# Patient Record
Sex: Male | Born: 1940 | Race: White | Hispanic: No | Marital: Married | Smoking: Former smoker
Health system: Southern US, Community
[De-identification: ages and names within clinical notes are randomized; demographics above are authoritative.]

## PROBLEM LIST (undated history)

## (undated) DIAGNOSIS — K5790 Diverticulosis of intestine, part unspecified, without perforation or abscess without bleeding: Secondary | ICD-10-CM

## (undated) DIAGNOSIS — K602 Anal fissure, unspecified: Secondary | ICD-10-CM

## (undated) DIAGNOSIS — K625 Hemorrhage of anus and rectum: Secondary | ICD-10-CM

## (undated) DIAGNOSIS — C61 Malignant neoplasm of prostate: Secondary | ICD-10-CM

## (undated) DIAGNOSIS — R001 Bradycardia, unspecified: Secondary | ICD-10-CM

## (undated) DIAGNOSIS — N289 Disorder of kidney and ureter, unspecified: Secondary | ICD-10-CM

## (undated) DIAGNOSIS — I10 Essential (primary) hypertension: Secondary | ICD-10-CM

## (undated) DIAGNOSIS — Z8546 Personal history of malignant neoplasm of prostate: Secondary | ICD-10-CM

## (undated) DIAGNOSIS — I739 Peripheral vascular disease, unspecified: Secondary | ICD-10-CM

## (undated) DIAGNOSIS — I480 Paroxysmal atrial fibrillation: Secondary | ICD-10-CM

## (undated) DIAGNOSIS — K627 Radiation proctitis: Secondary | ICD-10-CM

## (undated) DIAGNOSIS — S12100A Unspecified displaced fracture of second cervical vertebra, initial encounter for closed fracture: Secondary | ICD-10-CM

## (undated) DIAGNOSIS — N189 Chronic kidney disease, unspecified: Secondary | ICD-10-CM

## (undated) DIAGNOSIS — E785 Hyperlipidemia, unspecified: Secondary | ICD-10-CM

## (undated) DIAGNOSIS — K219 Gastro-esophageal reflux disease without esophagitis: Secondary | ICD-10-CM

## (undated) HISTORY — DX: Essential (primary) hypertension: I10

## (undated) HISTORY — DX: Personal history of malignant neoplasm of prostate: Z85.46

## (undated) HISTORY — DX: Gastro-esophageal reflux disease without esophagitis: K21.9

## (undated) HISTORY — DX: Diverticulosis of intestine, part unspecified, without perforation or abscess without bleeding: K57.90

## (undated) HISTORY — DX: Disorder of kidney and ureter, unspecified: N28.9

## (undated) HISTORY — DX: Chronic kidney disease, unspecified: N18.9

## (undated) HISTORY — DX: Anal fissure, unspecified: K60.2

## (undated) HISTORY — DX: Bradycardia, unspecified: R00.1

## (undated) HISTORY — DX: Paroxysmal atrial fibrillation: I48.0

## (undated) HISTORY — DX: Radiation proctitis: K62.7

## (undated) HISTORY — PX: CAROTID STENT: SHX1301

## (undated) HISTORY — DX: Hyperlipidemia, unspecified: E78.5

## (undated) HISTORY — DX: Hemorrhage of anus and rectum: K62.5

## (undated) HISTORY — DX: Malignant neoplasm of prostate: C61

---

## 2003-11-26 ENCOUNTER — Emergency Department (HOSPITAL_COMMUNITY): Admission: EM | Admit: 2003-11-26 | Discharge: 2003-11-26 | Payer: Self-pay | Admitting: Emergency Medicine

## 2004-09-09 ENCOUNTER — Ambulatory Visit: Payer: Self-pay | Admitting: Internal Medicine

## 2004-10-16 ENCOUNTER — Ambulatory Visit: Payer: Self-pay | Admitting: Internal Medicine

## 2007-02-25 HISTORY — PX: OTHER SURGICAL HISTORY: SHX169

## 2010-08-19 ENCOUNTER — Other Ambulatory Visit (HOSPITAL_COMMUNITY): Payer: Self-pay | Admitting: Orthopedic Surgery

## 2010-08-19 DIAGNOSIS — IMO0002 Reserved for concepts with insufficient information to code with codable children: Secondary | ICD-10-CM

## 2010-09-10 ENCOUNTER — Encounter (HOSPITAL_COMMUNITY)
Admission: RE | Admit: 2010-09-10 | Discharge: 2010-09-10 | Disposition: A | Discharge status: Home / Self Care | Payer: Medicare Other | Source: Ambulatory Visit | Attending: Orthopedic Surgery | Admitting: Orthopedic Surgery

## 2010-09-10 ENCOUNTER — Encounter (HOSPITAL_COMMUNITY): Payer: Self-pay

## 2010-09-10 DIAGNOSIS — IMO0002 Reserved for concepts with insufficient information to code with codable children: Secondary | ICD-10-CM

## 2010-09-10 DIAGNOSIS — L608 Other nail disorders: Secondary | ICD-10-CM | POA: Insufficient documentation

## 2010-09-10 DIAGNOSIS — L723 Sebaceous cyst: Secondary | ICD-10-CM | POA: Insufficient documentation

## 2010-09-10 MED ORDER — TECHNETIUM TC 99M MEDRONATE IV KIT
23.5000 | PACK | Freq: Once | INTRAVENOUS | Status: AC | PRN
  Administered 2010-09-10: 23.5 via INTRAVENOUS

## 2011-04-28 DIAGNOSIS — D649 Anemia, unspecified: Secondary | ICD-10-CM | POA: Diagnosis not present

## 2011-04-28 DIAGNOSIS — I129 Hypertensive chronic kidney disease with stage 1 through stage 4 chronic kidney disease, or unspecified chronic kidney disease: Secondary | ICD-10-CM | POA: Diagnosis not present

## 2011-04-28 DIAGNOSIS — E039 Hypothyroidism, unspecified: Secondary | ICD-10-CM | POA: Diagnosis not present

## 2011-04-28 DIAGNOSIS — N2581 Secondary hyperparathyroidism of renal origin: Secondary | ICD-10-CM | POA: Diagnosis not present

## 2011-04-28 DIAGNOSIS — E213 Hyperparathyroidism, unspecified: Secondary | ICD-10-CM | POA: Diagnosis not present

## 2011-05-07 ENCOUNTER — Encounter: Payer: Self-pay | Admitting: Internal Medicine

## 2011-05-07 ENCOUNTER — Telehealth: Payer: Self-pay | Admitting: Internal Medicine

## 2011-05-08 ENCOUNTER — Encounter: Payer: Self-pay | Admitting: Nurse Practitioner

## 2011-05-08 ENCOUNTER — Ambulatory Visit: Payer: BLUE CROSS/BLUE SHIELD | Admitting: Nurse Practitioner

## 2011-05-08 ENCOUNTER — Ambulatory Visit (INDEPENDENT_AMBULATORY_CARE_PROVIDER_SITE_OTHER): Payer: Medicare Other | Admitting: Nurse Practitioner

## 2011-05-08 VITALS — BP 128/56 | HR 48 | Ht 70.0 in | Wt 182.2 lb

## 2011-05-08 DIAGNOSIS — K625 Hemorrhage of anus and rectum: Secondary | ICD-10-CM | POA: Diagnosis not present

## 2011-05-08 NOTE — Telephone Encounter (Signed)
Pt states he has been having rectal bleeding with bright red blood. Sometimes there is blood on the tissue and sometimes it colors the toilet bowl. Pt requesting to be seen sooner that 1st available with Dr. Henrene Pastor. Pt scheduled to see Tye Savoy NP today at 11am. Pt aware of appt date and time.

## 2011-05-08 NOTE — Progress Notes (Signed)
05/08/2011 Timothy [name] [id number] [birth year]   HISTORY OF PRESENT ILLNESS: Patient is a 71 year old male who had a screening colonoscopy in 2006 by Dr. Henrene Pastor with findings of only diverticulosis. Patient presents with intermittent painless rectal bleeding. A couple of months ago he had a diarrheal illness with some associated rectal bleeding. Following that, while on a trip in [place], the patient had another episode of painless rectal bleeding not associated with any diarrhea nor constipation. Since then he has had a few more episodes of painless rectal bleeding in the setting of what he would consider normal bowel movements.  Past Medical History  Diagnosis Date  . Prostate cancer   . Kidney disease   . GERD (gastroesophageal reflux disease)   . HLD (hyperlipidemia)   . HTN (hypertension)   . Anal fissure     ?   Past Surgical History  Procedure Date  . Carotid stent     reports that he has quit smoking. His smoking use included Cigarettes. He has never used smokeless tobacco. He reports that he drinks alcohol. He reports that he does not use illicit drugs. family history is not on file. No Known Allergies    Outpatient Encounter Prescriptions as of 05/08/2011  Medication Sig Dispense Refill  . allopurinol (ZYLOPRIM) 300 MG tablet Take 150 mg by mouth daily.      Marland Kitchen amLODipine (NORVASC) 10 MG tablet Take 10 mg by mouth daily.      Marland Kitchen aspirin 81 MG tablet Take 81 mg by mouth daily.      Marland Kitchen atenolol (TENORMIN) 100 MG tablet Take 100 mg by mouth daily.      . Cholecalciferol (VITAMIN D) 2000 UNITS CAPS Take 1 capsule by mouth daily.      . furosemide (LASIX) 40 MG tablet Take 40 mg by mouth daily.      . Multiple Vitamin (MULTIVITAMIN) capsule Take 1 capsule by mouth once a week.      . rosuvastatin (CRESTOR) 20 MG tablet Take 20 mg by mouth daily.      Marland Kitchen telmisartan (MICARDIS) 80 MG tablet Take 80 mg by mouth daily.       REVIEW OF SYSTEMS  : All other systems reviewed and  negative except where noted in the History of Present Illness.   PHYSICAL EXAM: BP 128/56  Pulse 48  Ht 5\' 10"  (1.778 m)  Wt 182 lb 4 oz (82.668 kg)  BMI 26.15 kg/m2 General: Well developed white male in no acute distress Head: Normocephalic and atraumatic Eyes:  sclerae anicteric,conjunctive pink. Ears: Normal auditory acuity Neck: Supple, no masses.  Lungs: Clear throughout to auscultation Heart: Regular rate and rhythm Abdomen: Soft, non distended, nontender. No masses or hepatomegaly noted. Normal Bowel sounds Rectal: on anoscopy there was an internal hemorrhoid but beyond that were small red lesions resembling telangiectasias.  Musculoskeletal: Symmetrical with no gross deformities  Skin: No lesions on visible extremities Extremities: No edema or deformities noted Neurological: Alert oriented, grossly nonfocal Psychological:  Alert and cooperative. Normal mood and affect  ASSESSMENT AND PLAN;  1. 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  Patient scheduled for flexible sigmoidoscopy with APC. The risks and benefits were discussed with the patient and he consents to proceed.   2. Bradycardia, heart rate 48. Patient on a beta blocker, asymptomatic.

## 2011-05-08 NOTE — Patient Instructions (Signed)
We have given you instructions for the Flexible Sigmoidoscopy with Dr Lenon Curt at [facility], 1st floor.  Directions provided.  Flexible Sigmoidoscopy Your caregiver has ordered a flexible sigmoidoscopy. This is an exam to evaluate your lower colon. In this exam your colon is cleansed and a short fiber optic tube is inserted through your rectum and into your colon. The fiber optic scope (endoscope) is a short bundle of enclosed flexible small glass fibers. It transmits light to the area examined and images from that area to your caregiver. You do not have to worry about glass breakage in the endoscope. Discomfort is usually minimal. Sedatives and pain medications are generally not required. This exam helps to detect tumors (lumps), polyps, inflammation (swelling and soreness), and areas of bleeding. It may also be used to take biopsies. These are small pieces of tissue taken to examine under a microscope. LET YOUR CAREGIVER KNOW ABOUT:  Allergies.   Medications taken including herbs, eye drops, over the counter medications, and creams.   Use of steroids (by mouth or creams).   Previous problems with anesthetics or novocaine   Possibility of pregnancy, if this applies.   History of blood clots (thrombophlebitis).   History of bleeding or blood problems.   Previous surgery.   Other health problems.  BEFORE THE PROCEDURE Eat normally the night before the exam. Your caregiver may order a mild enema or laxative the night before. No eating or drinking should occur after midnight until the procedure is completed. A rectal suppository or enemas may be given in the morning prior to your procedure. You will be brought to the examination area in a hospital gown. You should be present 60 minutes prior to your procedure or as directed.  AFTER THE PROCEDURE   There is sometimes a little blood passed with the first bowel movement. Do not be concerned. Because air is often used  during the exam, it is not unusual to pass gas and experience abdominal (belly) cramping. Walking or a warm pack on your abdomen may help with this. Do not sleep with a heating pad as burns can occur.   You may resume all normal eating and activities.   Only take over-the-counter or prescription medicines for pain, discomfort, or fever as directed by your caregiver. Do not use aspirin or blood thinners if a biopsy (tissue sample) was taken. Consult your caregiver for medication usage if biopsies were taken.   Call for your results as instructed by your caregiver. Remember, it is your responsibility to obtain the results of your biopsy. Do not assume everything is fine because you do not hear from your caregiver.  SEEK IMMEDIATE MEDICAL CARE IF:  An oral temperature above 102 F (38.9 C) develops.   You pass large blood clots or fill a toilet with blood following the procedure. This may also occur 10 to 14 days following the procedure. It is more likely if a biopsy was taken.   You develop abdominal pain not relieved with medication or that is getting worse rather than better.  Document Released: 02/08/2000 Document Revised: 01/30/2011 Document Reviewed: 11/20/2004 [facility] Patient Information 2012 [place].

## 2011-05-09 ENCOUNTER — Encounter: Payer: Self-pay | Admitting: Nurse Practitioner

## 2011-05-09 DIAGNOSIS — K625 Hemorrhage of anus and rectum: Secondary | ICD-10-CM | POA: Insufficient documentation

## 2011-05-09 HISTORY — DX: Hemorrhage of anus and rectum: K62.5

## 2011-05-12 NOTE — Progress Notes (Signed)
Reviewed and agree with management. Neli Fofana D. Kyian Obst, M.D., FACG  

## 2011-05-21 ENCOUNTER — Ambulatory Visit: Payer: BLUE CROSS/BLUE SHIELD | Admitting: Internal Medicine

## 2011-05-22 ENCOUNTER — Encounter (HOSPITAL_COMMUNITY)
Admission: RE | Disposition: A | Discharge status: Home / Self Care | Payer: Self-pay | Source: Ambulatory Visit | Attending: Gastroenterology

## 2011-05-22 ENCOUNTER — Encounter (HOSPITAL_COMMUNITY): Payer: Self-pay | Admitting: *Deleted

## 2011-05-22 ENCOUNTER — Ambulatory Visit (HOSPITAL_COMMUNITY)
Admission: RE | Admit: 2011-05-22 | Discharge: 2011-05-22 | Disposition: A | Discharge status: Home / Self Care | Payer: Medicare Other | Source: Ambulatory Visit | Attending: Gastroenterology | Admitting: Gastroenterology

## 2011-05-22 DIAGNOSIS — Z79899 Other long term (current) drug therapy: Secondary | ICD-10-CM | POA: Insufficient documentation

## 2011-05-22 DIAGNOSIS — Y842 Radiological procedure and radiotherapy as the cause of abnormal reaction of the patient, or of later complication, without mention of misadventure at the time of the procedure: Secondary | ICD-10-CM | POA: Insufficient documentation

## 2011-05-22 DIAGNOSIS — Z8546 Personal history of malignant neoplasm of prostate: Secondary | ICD-10-CM | POA: Diagnosis not present

## 2011-05-22 DIAGNOSIS — I498 Other specified cardiac arrhythmias: Secondary | ICD-10-CM | POA: Diagnosis not present

## 2011-05-22 DIAGNOSIS — I10 Essential (primary) hypertension: Secondary | ICD-10-CM | POA: Diagnosis not present

## 2011-05-22 DIAGNOSIS — K625 Hemorrhage of anus and rectum: Secondary | ICD-10-CM

## 2011-05-22 DIAGNOSIS — K219 Gastro-esophageal reflux disease without esophagitis: Secondary | ICD-10-CM | POA: Insufficient documentation

## 2011-05-22 DIAGNOSIS — K627 Radiation proctitis: Secondary | ICD-10-CM

## 2011-05-22 DIAGNOSIS — E785 Hyperlipidemia, unspecified: Secondary | ICD-10-CM | POA: Insufficient documentation

## 2011-05-22 DIAGNOSIS — K5730 Diverticulosis of large intestine without perforation or abscess without bleeding: Secondary | ICD-10-CM | POA: Insufficient documentation

## 2011-05-22 DIAGNOSIS — Z7982 Long term (current) use of aspirin: Secondary | ICD-10-CM | POA: Insufficient documentation

## 2011-05-22 DIAGNOSIS — K6289 Other specified diseases of anus and rectum: Secondary | ICD-10-CM | POA: Insufficient documentation

## 2011-05-22 HISTORY — DX: Peripheral vascular disease, unspecified: I73.9

## 2011-05-22 HISTORY — DX: Hemorrhage of anus and rectum: K62.5

## 2011-05-22 HISTORY — DX: Radiation proctitis: K62.7

## 2011-05-22 HISTORY — PX: FLEXIBLE SIGMOIDOSCOPY: SHX5431

## 2011-05-22 HISTORY — PX: HOT HEMOSTASIS: SHX5433

## 2011-05-22 SURGERY — SIGMOIDOSCOPY, FLEXIBLE
Anesthesia: Moderate Sedation

## 2011-05-22 MED ORDER — SODIUM CHLORIDE 0.9 % IV SOLN
Freq: Once | INTRAVENOUS | Status: AC
  Administered 2011-05-22: 500 mL via INTRAVENOUS

## 2011-05-22 MED ORDER — MIDAZOLAM HCL 10 MG/2ML IJ SOLN
INTRAMUSCULAR | Status: DC | PRN
  Administered 2011-05-22 (×2): 2 mg via INTRAVENOUS
  Administered 2011-05-22: 1 mg via INTRAVENOUS

## 2011-05-22 MED ORDER — FENTANYL CITRATE 0.05 MG/ML IJ SOLN
INTRAMUSCULAR | Status: AC
  Filled 2011-05-22: qty 2

## 2011-05-22 MED ORDER — MIDAZOLAM HCL 10 MG/2ML IJ SOLN
INTRAMUSCULAR | Status: AC
  Filled 2011-05-22: qty 2

## 2011-05-22 MED ORDER — FENTANYL CITRATE 0.05 MG/ML IJ SOLN
INTRAMUSCULAR | Status: DC | PRN
  Administered 2011-05-22 (×2): 25 ug via INTRAVENOUS

## 2011-05-22 NOTE — H&P (View-Only) (Signed)
05/08/2011 Timothy [name] [id number] 02-Nov-1940   HISTORY OF PRESENT ILLNESS: Patient is a 71 year old male who had a screening colonoscopy in 2006 by Dr. Henrene Pastor with findings of only diverticulosis. Patient presents with intermittent painless rectal bleeding. A couple of months ago he had a diarrheal illness with some associated rectal bleeding. Following that, while on a trip in [place], the patient had another episode of painless rectal bleeding not associated with any diarrhea nor constipation. Since then he has had a few more episodes of painless rectal bleeding in the setting of what he would consider normal bowel movements.  Past Medical History  Diagnosis Date  . Prostate cancer   . Kidney disease   . GERD (gastroesophageal reflux disease)   . HLD (hyperlipidemia)   . HTN (hypertension)   . Anal fissure     ?   Past Surgical History  Procedure Date  . Carotid stent     reports that he has quit smoking. His smoking use included Cigarettes. He has never used smokeless tobacco. He reports that he drinks alcohol. He reports that he does not use illicit drugs. family history is not on file. No Known Allergies    Outpatient Encounter Prescriptions as of 05/08/2011  Medication Sig Dispense Refill  . allopurinol (ZYLOPRIM) 300 MG tablet Take 150 mg by mouth daily.      Marland Kitchen amLODipine (NORVASC) 10 MG tablet Take 10 mg by mouth daily.      Marland Kitchen aspirin 81 MG tablet Take 81 mg by mouth daily.      Marland Kitchen atenolol (TENORMIN) 100 MG tablet Take 100 mg by mouth daily.      . Cholecalciferol (VITAMIN D) 2000 UNITS CAPS Take 1 capsule by mouth daily.      . furosemide (LASIX) 40 MG tablet Take 40 mg by mouth daily.      . Multiple Vitamin (MULTIVITAMIN) capsule Take 1 capsule by mouth once a week.      . rosuvastatin (CRESTOR) 20 MG tablet Take 20 mg by mouth daily.      Marland Kitchen telmisartan (MICARDIS) 80 MG tablet Take 80 mg by mouth daily.       REVIEW OF SYSTEMS  : All other systems reviewed and  negative except where noted in the History of Present Illness.   PHYSICAL EXAM: BP 128/56  Pulse 48  Ht 5\' 10"  (1.778 m)  Wt 182 lb 4 oz (82.668 kg)  BMI 26.15 kg/m2 General: [name] developed white male in no acute distress Head: Normocephalic and atraumatic Eyes:  sclerae anicteric,conjunctive pink. Ears: Normal auditory acuity Neck: Supple, no masses.  Lungs: Clear throughout to auscultation Heart: Regular rate and rhythm Abdomen: Soft, non distended, nontender. No masses or hepatomegaly noted. Normal Bowel sounds Rectal: on anoscopy there was an internal hemorrhoid but beyond that were small red lesions resembling telangiectasias.  Musculoskeletal: Symmetrical with no gross deformities  Skin: No lesions on visible extremities Extremities: No edema or deformities noted Neurological: Alert oriented, grossly nonfocal Psychological:  Alert and cooperative. Normal mood and affect  ASSESSMENT AND PLAN;  1. 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  Patient scheduled for flexible sigmoidoscopy with APC. The risks and benefits were discussed with the patient and he consents to proceed.   2. Bradycardia, heart rate 48. Patient on a beta blocker, asymptomatic.

## 2011-05-22 NOTE — Op Note (Addendum)
[facility] [place], [place]  [address]  FLEXIBLE SIGMOIDOSCOPY PROCEDURE REPORT  PATIENT:  Timothy, [name]  MR#:  [id number] BIRTHDATE:  1940-08-12, 70 yrs. old  GENDER:  male  ENDOSCOPIST:  Sandy Salaam. Deatra Ina, MD Referred by:  Crist Infante, M.D.  PROCEDURE DATE:  05/22/2011 PROCEDURE:  Flexible Sigmoidoscopy with ablation of AVMs, Flexible Sigmoidoscopy for control of bleeding ASA CLASS:  Class II INDICATIONS:  rectal bleeding h/o radiation proctitis  MEDICATIONS:   These medications were titrated to patient response per physician's verbal order, Fentanyl 50 mcg IV, Versed 5 mg IV  DESCRIPTION OF PROCEDURE:   After the risks benefits and alternatives of the procedure were thoroughly explained, informed consent was obtained.  Digital rectal exam was performed and revealed no abnormalities.   The Pentax Egd Scope endoscope was introduced through the anus and advanced to the sigmoid colon, without limitations.  The quality of the prep was .  The instrument was then slowly withdrawn as the mucosa was fully examined. <<PROCEDUREIMAGES>>  radiation proctitis. Multiple areas of bleeding telangiectasia on anterior wall of rectum. Argon plasma coagulation was used to ablate the abnormal mucosa. The APC was ultilized to cauterize and fulgurate the numerous telangiectasias (see image1, image3, image5, and image6).  Mild diverticulosis was found in the sigmoid colon (see image2). Retroflexed views in the rectum revealed proctitis.    The scope was then withdrawn from the patient and the procedure terminated.  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 Deatra Ina, MD  CC:  n. REVISED:  05/22/2011 01:12 PM eSIGNED:   Sandy Salaam.  Steven Veazie at 05/22/2011 01:12 PM  [name], [id number]

## 2011-05-22 NOTE — Interval H&P Note (Signed)
History and Physical Interval Note:  05/22/2011 12:33 PM  Timothy [name]  has presented today for surgery, with the diagnosis of 569.3 Rectal bleeding  The various methods of treatment have been discussed with the patient and family. After consideration of risks, benefits and other options for treatment, the patient has consented to  Procedure(s) (LRB): FLEXIBLE SIGMOIDOSCOPY (N/A) HOT HEMOSTASIS (ARGON PLASMA COAGULATION/BICAP) (N/A) as a surgical intervention .  The patients' history has been reviewed, patient examined, no change in status, stable for surgery.  I have reviewed the patients' chart and labs.  Questions were answered to the patient's satisfaction.    The recent H&P (dated 05/08/11*) was reviewed, the patient was examined and there is no change in the patients condition since that H&P was completed.   Erskine Emery  05/22/2011, 12:34 PM    Erskine Emery

## 2011-05-22 NOTE — Discharge Instructions (Signed)
Colonoscopy Care After Read the instructions outlined below and refer to this sheet in the next few weeks. These discharge instructions provide you with general information on caring for yourself after you leave the hospital. Your doctor may also give you specific instructions. While your treatment has been planned according to the most current medical practices available, unavoidable complications occasionally occur. If you have any problems or questions after discharge, call your doctor. HOME CARE INSTRUCTIONS ACTIVITY:  You may resume your regular activity, but move at a slower pace for the next 24 hours.   Take frequent rest periods for the next 24 hours.   Walking will help get rid of the air and reduce the bloated feeling in your belly (abdomen).   No driving for 24 hours (because of the medicine (anesthesia) used during the test).   You may shower.   Do not sign any important legal documents or operate any machinery for 24 hours (because of the anesthesia used during the test).  NUTRITION:  Drink plenty of fluids.   You may resume your normal diet as instructed by your doctor.   Begin with a light meal and progress to your normal diet. Heavy or fried foods are harder to digest and may make you feel sick to your stomach (nauseated).   Avoid alcoholic beverages for 24 hours or as instructed.  MEDICATIONS:  You may resume your normal medications unless your doctor tells you otherwise.  WHAT TO EXPECT TODAY:  Some feelings of bloating in the abdomen.   Passage of more gas than usual.   Spotting of blood in your stool or on the toilet paper.  IF YOU HAD POLYPS REMOVED DURING THE COLONOSCOPY:  No aspirin products for 7 days or as instructed.   No alcohol for 7 days or as instructed.   Eat a soft diet for the next 24 hours.  FINDING OUT THE RESULTS OF YOUR TEST Not all test results are available during your visit. If your test results are not back during the visit, make an  appointment with your caregiver to find out the results. Do not assume everything is normal if you have not heard from your caregiver or the medical facility. It is important for you to follow up on all of your test results.  SEEK IMMEDIATE MEDICAL CARE IF:  You have more than a spotting of blood in your stool.   Your belly is swollen (abdominal distention).   You are nauseated or vomiting.   You have a fever.   You have abdominal pain or discomfort that is severe or gets worse throughout the day.  Document Released: 09/25/2003 Document Revised: 01/30/2011 Document Reviewed: 09/23/2007 [employer] Patient Information 2012 [employer]. 

## 2011-05-26 ENCOUNTER — Telehealth: Payer: Self-pay | Admitting: *Deleted

## 2011-05-26 ENCOUNTER — Encounter (HOSPITAL_COMMUNITY): Payer: Self-pay | Admitting: Gastroenterology

## 2011-05-26 NOTE — Telephone Encounter (Signed)
Spoke with patient and he states he was told the flex sig would be easier than a colonoscopy. He states he has not found that to be true at all. States he felt "like I had been kicked in the nuts." States he thought he would be back to normal quickly but he was not. States he did not feel good on Friday and he thought he would get a telephone call but he did not get a call back.(patient's phone number in system is incorrect. This has been corrected today) States he had pain with bowel movement and when urinating.He continues to have this pain but it is not as bad today. He has no energy when he gets up in AM but does feel better later on. States he is not having any bleeding at this time. Dr. Deatra Ina aware and he will call patient today.

## 2011-05-26 NOTE — Telephone Encounter (Signed)
Line busy. Will try again later.

## 2011-05-26 NOTE — Telephone Encounter (Signed)
The patient was having some pain in the scrotum upon urination and some rectal pain with defecation. I explained that this was a result of his laser therapy. He's had no further bleeding. The patient was reassured. He was encouraged to call back if he has any further questions or concerns.

## 2011-06-05 ENCOUNTER — Ambulatory Visit (INDEPENDENT_AMBULATORY_CARE_PROVIDER_SITE_OTHER): Payer: Medicare Other | Admitting: Physician Assistant

## 2011-06-05 ENCOUNTER — Telehealth: Payer: Self-pay | Admitting: Gastroenterology

## 2011-06-05 ENCOUNTER — Encounter: Payer: Self-pay | Admitting: Physician Assistant

## 2011-06-05 VITALS — BP 120/50 | HR 56 | Ht 70.0 in | Wt 177.0 lb

## 2011-06-05 DIAGNOSIS — K6289 Other specified diseases of anus and rectum: Secondary | ICD-10-CM | POA: Diagnosis not present

## 2011-06-05 DIAGNOSIS — Z8546 Personal history of malignant neoplasm of prostate: Secondary | ICD-10-CM

## 2011-06-05 DIAGNOSIS — I10 Essential (primary) hypertension: Secondary | ICD-10-CM | POA: Diagnosis not present

## 2011-06-05 HISTORY — DX: Personal history of malignant neoplasm of prostate: Z85.46

## 2011-06-05 MED ORDER — MESALAMINE 1000 MG RE SUPP: 1000.0000 mg | Freq: Two times a day (BID) | RECTAL | Status: DC

## 2011-06-05 NOTE — Telephone Encounter (Signed)
Dr. Deatra Ina is out of the office today. Pt scheduled to see Nicoletta Ba PA today at 2pm. Pt aware of appt date and time.

## 2011-06-05 NOTE — Patient Instructions (Signed)
We have given you a prescription and samples of Canasa Suppositories.

## 2011-06-05 NOTE — Telephone Encounter (Signed)
As I am only familiar with this patient remotely, and was not involved in his recent assessment for procedure. I think it would be best to have Dr. Deatra Ina (who performed the procedure) to address this patient's issue with postprocedure pain. He may even desire to see the patient in the office to be sure that there are no untoward issues.

## 2011-06-05 NOTE — Telephone Encounter (Signed)
Pt last seen 05/08/11 by Tye Savoy NP. Pt was having rectal bleeding thought to be caused by radiation proctitis. Pt scheduled for flex-sig with APC with Dr. Deatra Ina 05/22/11. Pt states that was 2 weeks ago and he is still having discomfort. States that when he has a BM he has pain for about 39min-1hr in the anal area that radiates to his scrotum and penis. Pt states the discomfort has gotten a little better with time but he wants to know if this is normal for 2 weeks out. Dr. Henrene Pastor please advise.

## 2011-06-05 NOTE — Progress Notes (Addendum)
Subjective:    Patient ID: Timothy [name], male    DOB: [birth year], 71 y.o.   MRN: [id number]  HPI Timothy [name] is a 71 year old white male known remotely to Dr. Henrene Pastor from colonoscopy done in August of 2006 at which time he was found to have this mild sigmoid diverticulosis and otherwise a negative exam. He was diagnosed with prostate cancer approximately 3 years ago and underwent of 42 radiation treatments. He was seen here in March of 2013 with complaints of intermittent rectal bleeding, and on anoscopic exam was felt to have findings consistent with a radiation proctitis. At that time patient says he was not having any pain but was having episodes of rectal bleeding about once a week.  He underwent flexible sigmoidoscopy with APC treatment to his radiation proctitis per Dr. Deatra Ina on 05/22/2011. Patient says that he felt okay today after the procedure other than being a little bit sore but then over that we can developed more of a rectal pain and pressure. He was unhappy because he was unable to speak to anybody until following Tuesday at which time he did speak to Dr. Deatra Ina and was told that it would be expected that he would have some pain after his procedure. At This time he comes in because now he is 2  weeks postprocedure and is continuing to have pain. He says he admits that he is gradually feeling better and has felt a little bit better each day over the past week however today is not feeling any better than yesterday. He says his bowel movements have always been very regular he is still not having any problems with constipation or diarrhea.  He says he has developed episodes of significant urgency followed by a bowel movement with relieves the urgency sensation but this is followed by a pain in his rectum at radiates down to his groin, and was initially lasting for a couple of hours. He says he still having those episodes but the pain only lasts for about 30 minutes at this point. He says he has not seen  any blood, he has no complaint of fever or chills and absolutely no abdominal pain.    Review of Systems  Constitutional: Negative.   HENT: Negative.   Eyes: Negative.   Cardiovascular: Negative.   Gastrointestinal: Positive for rectal pain.  Genitourinary: Negative.   Musculoskeletal: Negative.   Neurological: Negative.   Hematological: Negative.   Psychiatric/Behavioral: Negative.    Outpatient Encounter Prescriptions as of 06/05/2011  Medication Sig Dispense Refill  . allopurinol (ZYLOPRIM) 300 MG tablet Take 150 mg by mouth daily.      Marland Kitchen amLODipine (NORVASC) 10 MG tablet Take 10 mg by mouth daily.      Marland Kitchen aspirin 81 MG tablet Take 81 mg by mouth daily.      Marland Kitchen atenolol (TENORMIN) 100 MG tablet Take 50 mg by mouth daily.       . Cholecalciferol (VITAMIN D) 2000 UNITS CAPS Take 1 capsule by mouth daily.      . furosemide (LASIX) 40 MG tablet Take 40 mg by mouth daily.      . Multiple Vitamin (MULTIVITAMIN) capsule Take 1 capsule by mouth once a week.      . rosuvastatin (CRESTOR) 20 MG tablet Take 20 mg by mouth daily.      Marland Kitchen telmisartan (MICARDIS) 80 MG tablet Take 80 mg by mouth daily.      . mesalamine (CANASA) 1000 MG suppository Place 1 suppository (1,000 mg total) rectally  2 (two) times daily.  14 suppository  0   No Known Allergies     Objective:   Physical Exam well-developed white male in no acute distress, pleasant blood pressure 120/50 pulse 56. HEENT; nontraumatic normocephalic EOMI PERRLA sclera anicteric, Supple, Cardiovascular; regular rate and rhythm with S1-S2 no murmur gallop, Pulmonary; clear bilaterally, Abdomen; soft nontender nondistended no palpable mass or hepatosplenomegaly bowel sounds are active, Rectal exam not done, Extremities; no clubbing cyanosis or edema skin warm dry, Psych; mood and affect normal and appropriate        Assessment & Plan:  #84 71 year old male 2 weeks status post APC treatment for radiation proctitis complicated by significant  rectal pain and urgency most consistent with postprocedure rectal ulceration and edema. He is very gradually improving.  Plan; Will start Canasa suppositories twice daily over the next week, we discussed the use of enemas however he is not certain that he did retain an enema at this time. Tylenol as needed Out of work for another 5 to 6 days as he stands when he is working. We will check on him on Monday 4/15/ 2013 and if f the suppositories did not seem to be helping may offer sucralfate enemas short-term.  Addendum: Reviewed and agree with management.  Lajuan Lines. Pyrtle, M.D.  06/09/2011

## 2011-06-23 ENCOUNTER — Ambulatory Visit: Payer: BLUE CROSS/BLUE SHIELD | Admitting: Internal Medicine

## 2011-07-01 ENCOUNTER — Ambulatory Visit (INDEPENDENT_AMBULATORY_CARE_PROVIDER_SITE_OTHER): Payer: Medicare Other | Admitting: Internal Medicine

## 2011-07-01 ENCOUNTER — Ambulatory Visit: Payer: BLUE CROSS/BLUE SHIELD | Admitting: Internal Medicine

## 2011-07-01 ENCOUNTER — Encounter: Payer: Self-pay | Admitting: Internal Medicine

## 2011-07-01 VITALS — BP 120/60 | HR 58 | Ht 69.0 in | Wt 176.0 lb

## 2011-07-01 DIAGNOSIS — K5900 Constipation, unspecified: Secondary | ICD-10-CM

## 2011-07-01 DIAGNOSIS — K6289 Other specified diseases of anus and rectum: Secondary | ICD-10-CM | POA: Diagnosis not present

## 2011-07-01 DIAGNOSIS — K625 Hemorrhage of anus and rectum: Secondary | ICD-10-CM

## 2011-07-01 DIAGNOSIS — K627 Radiation proctitis: Secondary | ICD-10-CM

## 2011-07-01 NOTE — Patient Instructions (Signed)
Please follow up as needed 

## 2011-07-01 NOTE — Progress Notes (Signed)
HISTORY OF PRESENT ILLNESS:  Timothy [name] is a 71 y.o. male who I saw in 2006 for screening colonoscopy which revealed mild diverticulosis. About 3 years ago he was diagnosed with prostate cancer which was treated with radiation therapy. He was seen in March with a several month history of progressive rectal bleeding. In my absence, he was scheduled for flexible sigmoidoscopy with possible APC therapy by Dr. Deatra Ina. He underwent that examination on 05/22/2011. 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 Post procedure the patient developed significant rectal pain. As well change in his bowel habits with a tendency toward constipation. For that, he was seen in the office 06/05/2011. He was treated with mesalamine suppositories. For his irregular bowel habits he has used stool softeners. This helps. He presents today for followup. He states that he is 99% better. No further bleeding. Still with some bowel regularity. Still with some rectal discomfort with defecation, though infrequent and minor. He does voice displeasure that he experienced post procedure pain. He feels that he was mis-lead in terms of the "simple" nature of the procedure. We discussed this together.  REVIEW OF SYSTEMS:  All non-GI ROS negative except for fatigue and muscle cramps  Past Medical History  Diagnosis Date  . Prostate cancer   . GERD (gastroesophageal reflux disease)   . HLD (hyperlipidemia)   . HTN (hypertension)   . Anal fissure     ?  . Kidney disease     acute glomeral nepritis  . Bright red rectal bleeding   . Peripheral vascular disease     carotid artery stent ( right  )  . Diverticulosis     Past Surgical History  Procedure Date  . Carotid stent   . Flexible sigmoidoscopy 05/22/2011    Procedure: FLEXIBLE SIGMOIDOSCOPY;  Surgeon: [name], MD;  Location: [facility];  Service: Endoscopy;  Laterality: N/A;  . Hot hemostasis  05/22/2011    Procedure: HOT HEMOSTASIS (ARGON PLASMA COAGULATION/BICAP);  Surgeon: [name], MD;  Location: [facility];  Service: Endoscopy;  Laterality: N/A;    Social History Timothy [name]  reports that he has quit smoking. His smoking use included Cigarettes. He has never used smokeless tobacco. He reports that he drinks alcohol. He reports that he does not use illicit drugs.  family history is negative for Colon cancer.  No Known Allergies     PHYSICAL EXAMINATION: Vital signs: BP 120/60  Pulse 58  Ht 5\' 9"  (1.753 m)  Wt 176 lb (79.833 kg)  BMI 25.99 kg/m2 General: Well-developed, well-nourished, no acute distress HEENT: Sclerae are anicteric, conjunctiva pink. Oral mucosa intact Lungs: Clear Heart: Regular Abdomen: soft, nontender, nondistended, no obvious ascites, no peritoneal signs, normal bowel sounds. No organomegaly. Extremities: No edema Psychiatric: alert and oriented x3. Cooperative    ASSESSMENT:  #1. Radiation proctitis (history of prostate cancer). Primary symptom of bleeding resolved after flexible sigmoidoscopy with APC therapy #2. Post procedure pain presumably due to thermal effect at the dentate line. Significantly improved #3. Change in bowel habits associated with #2 #4. Complete screening colonoscopy with mild diverticulosis in 2006   PLAN:  #1. Fiber supplementation in the form of Metamucil and stool softeners as needed to regulate bowels #2. Routine screening colonoscopy in 2016. #3. Contact the office in the interim for recurrent problems with bleeding or other relevant issues

## 2011-07-08 DIAGNOSIS — C61 Malignant neoplasm of prostate: Secondary | ICD-10-CM | POA: Diagnosis not present

## 2011-07-31 DIAGNOSIS — L570 Actinic keratosis: Secondary | ICD-10-CM | POA: Diagnosis not present

## 2011-08-05 DIAGNOSIS — N182 Chronic kidney disease, stage 2 (mild): Secondary | ICD-10-CM | POA: Diagnosis not present

## 2011-08-05 DIAGNOSIS — K625 Hemorrhage of anus and rectum: Secondary | ICD-10-CM | POA: Diagnosis not present

## 2011-08-05 DIAGNOSIS — R7301 Impaired fasting glucose: Secondary | ICD-10-CM | POA: Diagnosis not present

## 2011-08-05 DIAGNOSIS — I739 Peripheral vascular disease, unspecified: Secondary | ICD-10-CM | POA: Diagnosis not present

## 2011-08-05 DIAGNOSIS — E079 Disorder of thyroid, unspecified: Secondary | ICD-10-CM | POA: Diagnosis not present

## 2011-08-19 DIAGNOSIS — I700 Atherosclerosis of aorta: Secondary | ICD-10-CM | POA: Diagnosis not present

## 2011-08-19 DIAGNOSIS — I714 Abdominal aortic aneurysm, without rupture: Secondary | ICD-10-CM | POA: Diagnosis not present

## 2011-09-16 DIAGNOSIS — L570 Actinic keratosis: Secondary | ICD-10-CM | POA: Diagnosis not present

## 2011-09-16 DIAGNOSIS — L821 Other seborrheic keratosis: Secondary | ICD-10-CM | POA: Diagnosis not present

## 2011-10-07 ENCOUNTER — Telehealth: Payer: Self-pay | Admitting: Internal Medicine

## 2011-10-07 NOTE — Telephone Encounter (Signed)
Pt is due for colon recall in 2016. Pt has had prostate cancer and wonders if he is at increased risk due to this and should he have a sooner recall. Please advise.

## 2011-10-07 NOTE — Telephone Encounter (Signed)
Pt aware.

## 2011-10-07 NOTE — Telephone Encounter (Signed)
The problems are unrelated. Keep his current recall unless relevant clinical problems develop. I appreciate him checking. Wish him well

## 2011-10-22 DIAGNOSIS — E785 Hyperlipidemia, unspecified: Secondary | ICD-10-CM | POA: Diagnosis not present

## 2011-10-22 DIAGNOSIS — M109 Gout, unspecified: Secondary | ICD-10-CM | POA: Diagnosis not present

## 2011-10-22 DIAGNOSIS — C61 Malignant neoplasm of prostate: Secondary | ICD-10-CM | POA: Diagnosis not present

## 2011-10-22 DIAGNOSIS — I739 Peripheral vascular disease, unspecified: Secondary | ICD-10-CM | POA: Diagnosis not present

## 2011-10-22 DIAGNOSIS — I10 Essential (primary) hypertension: Secondary | ICD-10-CM | POA: Diagnosis not present

## 2011-10-22 DIAGNOSIS — I129 Hypertensive chronic kidney disease with stage 1 through stage 4 chronic kidney disease, or unspecified chronic kidney disease: Secondary | ICD-10-CM | POA: Diagnosis not present

## 2011-10-22 DIAGNOSIS — N183 Chronic kidney disease, stage 3 unspecified: Secondary | ICD-10-CM | POA: Diagnosis not present

## 2012-01-27 DIAGNOSIS — H43399 Other vitreous opacities, unspecified eye: Secondary | ICD-10-CM | POA: Diagnosis not present

## 2012-01-27 DIAGNOSIS — H2510 Age-related nuclear cataract, unspecified eye: Secondary | ICD-10-CM | POA: Diagnosis not present

## 2012-01-27 DIAGNOSIS — H04129 Dry eye syndrome of unspecified lacrimal gland: Secondary | ICD-10-CM | POA: Diagnosis not present

## 2012-01-27 DIAGNOSIS — H35039 Hypertensive retinopathy, unspecified eye: Secondary | ICD-10-CM | POA: Diagnosis not present

## 2012-01-28 DIAGNOSIS — C61 Malignant neoplasm of prostate: Secondary | ICD-10-CM | POA: Diagnosis not present

## 2012-03-23 DIAGNOSIS — E785 Hyperlipidemia, unspecified: Secondary | ICD-10-CM | POA: Diagnosis not present

## 2012-03-23 DIAGNOSIS — I10 Essential (primary) hypertension: Secondary | ICD-10-CM | POA: Diagnosis not present

## 2012-03-23 DIAGNOSIS — R7301 Impaired fasting glucose: Secondary | ICD-10-CM | POA: Diagnosis not present

## 2012-03-23 DIAGNOSIS — N184 Chronic kidney disease, stage 4 (severe): Secondary | ICD-10-CM | POA: Diagnosis not present

## 2012-03-23 DIAGNOSIS — I739 Peripheral vascular disease, unspecified: Secondary | ICD-10-CM | POA: Diagnosis not present

## 2012-03-23 DIAGNOSIS — Z125 Encounter for screening for malignant neoplasm of prostate: Secondary | ICD-10-CM | POA: Diagnosis not present

## 2012-03-26 ENCOUNTER — Other Ambulatory Visit: Payer: Self-pay | Admitting: Internal Medicine

## 2012-03-26 ENCOUNTER — Ambulatory Visit (INDEPENDENT_AMBULATORY_CARE_PROVIDER_SITE_OTHER): Payer: Medicare Other | Admitting: *Deleted

## 2012-03-26 DIAGNOSIS — I722 Aneurysm of renal artery: Secondary | ICD-10-CM | POA: Diagnosis not present

## 2012-03-26 DIAGNOSIS — I10 Essential (primary) hypertension: Secondary | ICD-10-CM | POA: Diagnosis not present

## 2012-03-26 DIAGNOSIS — N186 End stage renal disease: Secondary | ICD-10-CM | POA: Diagnosis not present

## 2012-03-26 DIAGNOSIS — R7989 Other specified abnormal findings of blood chemistry: Secondary | ICD-10-CM

## 2012-03-30 ENCOUNTER — Ambulatory Visit
Admission: RE | Admit: 2012-03-30 | Discharge: 2012-03-30 | Disposition: A | Discharge status: Home / Self Care | Payer: Medicare Other | Source: Ambulatory Visit | Attending: Internal Medicine | Admitting: Internal Medicine

## 2012-03-30 DIAGNOSIS — I120 Hypertensive chronic kidney disease with stage 5 chronic kidney disease or end stage renal disease: Secondary | ICD-10-CM | POA: Diagnosis not present

## 2012-03-30 DIAGNOSIS — R7989 Other specified abnormal findings of blood chemistry: Secondary | ICD-10-CM

## 2012-03-30 DIAGNOSIS — Z0000 Encounter for general adult medical examination without abnormal findings: Secondary | ICD-10-CM | POA: Diagnosis not present

## 2012-03-30 DIAGNOSIS — R944 Abnormal results of kidney function studies: Secondary | ICD-10-CM | POA: Diagnosis not present

## 2012-03-30 DIAGNOSIS — Z125 Encounter for screening for malignant neoplasm of prostate: Secondary | ICD-10-CM | POA: Diagnosis not present

## 2012-03-30 DIAGNOSIS — N184 Chronic kidney disease, stage 4 (severe): Secondary | ICD-10-CM | POA: Diagnosis not present

## 2012-03-30 DIAGNOSIS — Z79899 Other long term (current) drug therapy: Secondary | ICD-10-CM | POA: Diagnosis not present

## 2012-04-02 DIAGNOSIS — Z1212 Encounter for screening for malignant neoplasm of rectum: Secondary | ICD-10-CM | POA: Diagnosis not present

## 2012-04-13 DIAGNOSIS — N184 Chronic kidney disease, stage 4 (severe): Secondary | ICD-10-CM | POA: Diagnosis not present

## 2012-04-13 DIAGNOSIS — I129 Hypertensive chronic kidney disease with stage 1 through stage 4 chronic kidney disease, or unspecified chronic kidney disease: Secondary | ICD-10-CM | POA: Diagnosis not present

## 2012-04-13 DIAGNOSIS — N2581 Secondary hyperparathyroidism of renal origin: Secondary | ICD-10-CM | POA: Diagnosis not present

## 2012-04-13 DIAGNOSIS — D649 Anemia, unspecified: Secondary | ICD-10-CM | POA: Diagnosis not present

## 2012-04-13 DIAGNOSIS — M109 Gout, unspecified: Secondary | ICD-10-CM | POA: Diagnosis not present

## 2012-07-12 DIAGNOSIS — I129 Hypertensive chronic kidney disease with stage 1 through stage 4 chronic kidney disease, or unspecified chronic kidney disease: Secondary | ICD-10-CM | POA: Diagnosis not present

## 2012-07-12 DIAGNOSIS — I10 Essential (primary) hypertension: Secondary | ICD-10-CM | POA: Diagnosis not present

## 2012-07-12 DIAGNOSIS — D649 Anemia, unspecified: Secondary | ICD-10-CM | POA: Diagnosis not present

## 2012-07-12 DIAGNOSIS — N2581 Secondary hyperparathyroidism of renal origin: Secondary | ICD-10-CM | POA: Diagnosis not present

## 2012-07-12 DIAGNOSIS — N184 Chronic kidney disease, stage 4 (severe): Secondary | ICD-10-CM | POA: Diagnosis not present

## 2012-07-12 DIAGNOSIS — M109 Gout, unspecified: Secondary | ICD-10-CM | POA: Diagnosis not present

## 2012-07-29 DIAGNOSIS — N184 Chronic kidney disease, stage 4 (severe): Secondary | ICD-10-CM | POA: Diagnosis not present

## 2012-07-29 DIAGNOSIS — Q619 Cystic kidney disease, unspecified: Secondary | ICD-10-CM | POA: Diagnosis not present

## 2012-08-09 DIAGNOSIS — C61 Malignant neoplasm of prostate: Secondary | ICD-10-CM | POA: Diagnosis not present

## 2012-08-09 DIAGNOSIS — N189 Chronic kidney disease, unspecified: Secondary | ICD-10-CM | POA: Diagnosis not present

## 2012-09-28 DIAGNOSIS — Z1331 Encounter for screening for depression: Secondary | ICD-10-CM | POA: Diagnosis not present

## 2012-09-28 DIAGNOSIS — M109 Gout, unspecified: Secondary | ICD-10-CM | POA: Diagnosis not present

## 2012-09-28 DIAGNOSIS — E079 Disorder of thyroid, unspecified: Secondary | ICD-10-CM | POA: Diagnosis not present

## 2012-09-28 DIAGNOSIS — N184 Chronic kidney disease, stage 4 (severe): Secondary | ICD-10-CM | POA: Diagnosis not present

## 2012-09-28 DIAGNOSIS — E785 Hyperlipidemia, unspecified: Secondary | ICD-10-CM | POA: Diagnosis not present

## 2012-09-28 DIAGNOSIS — R7301 Impaired fasting glucose: Secondary | ICD-10-CM | POA: Diagnosis not present

## 2012-09-28 DIAGNOSIS — Z23 Encounter for immunization: Secondary | ICD-10-CM | POA: Diagnosis not present

## 2012-09-28 DIAGNOSIS — I10 Essential (primary) hypertension: Secondary | ICD-10-CM | POA: Diagnosis not present

## 2012-09-28 DIAGNOSIS — IMO0002 Reserved for concepts with insufficient information to code with codable children: Secondary | ICD-10-CM | POA: Diagnosis not present

## 2012-10-18 DIAGNOSIS — I10 Essential (primary) hypertension: Secondary | ICD-10-CM | POA: Diagnosis not present

## 2012-10-18 DIAGNOSIS — N2581 Secondary hyperparathyroidism of renal origin: Secondary | ICD-10-CM | POA: Diagnosis not present

## 2012-10-18 DIAGNOSIS — I129 Hypertensive chronic kidney disease with stage 1 through stage 4 chronic kidney disease, or unspecified chronic kidney disease: Secondary | ICD-10-CM | POA: Diagnosis not present

## 2012-10-18 DIAGNOSIS — N184 Chronic kidney disease, stage 4 (severe): Secondary | ICD-10-CM | POA: Diagnosis not present

## 2012-10-18 DIAGNOSIS — E785 Hyperlipidemia, unspecified: Secondary | ICD-10-CM | POA: Diagnosis not present

## 2012-10-31 ENCOUNTER — Ambulatory Visit: Payer: Self-pay | Admitting: Family Medicine

## 2012-10-31 DIAGNOSIS — I10 Essential (primary) hypertension: Secondary | ICD-10-CM | POA: Diagnosis not present

## 2012-10-31 DIAGNOSIS — M5430 Sciatica, unspecified side: Secondary | ICD-10-CM | POA: Diagnosis not present

## 2012-10-31 DIAGNOSIS — Z79899 Other long term (current) drug therapy: Secondary | ICD-10-CM | POA: Diagnosis not present

## 2012-10-31 DIAGNOSIS — E7800 Pure hypercholesterolemia, unspecified: Secondary | ICD-10-CM | POA: Diagnosis not present

## 2012-10-31 DIAGNOSIS — M5137 Other intervertebral disc degeneration, lumbosacral region: Secondary | ICD-10-CM | POA: Diagnosis not present

## 2012-11-05 ENCOUNTER — Other Ambulatory Visit: Payer: Self-pay | Admitting: Family Medicine

## 2012-11-05 DIAGNOSIS — M545 Low back pain, unspecified: Secondary | ICD-10-CM | POA: Diagnosis not present

## 2012-11-05 DIAGNOSIS — M5430 Sciatica, unspecified side: Secondary | ICD-10-CM | POA: Diagnosis not present

## 2012-11-05 DIAGNOSIS — M549 Dorsalgia, unspecified: Secondary | ICD-10-CM

## 2012-11-16 ENCOUNTER — Other Ambulatory Visit: Payer: Medicare Other

## 2012-11-19 DIAGNOSIS — Q762 Congenital spondylolisthesis: Secondary | ICD-10-CM | POA: Diagnosis not present

## 2012-11-19 DIAGNOSIS — M5126 Other intervertebral disc displacement, lumbar region: Secondary | ICD-10-CM | POA: Diagnosis not present

## 2012-11-19 DIAGNOSIS — M25559 Pain in unspecified hip: Secondary | ICD-10-CM | POA: Diagnosis not present

## 2012-11-23 ENCOUNTER — Other Ambulatory Visit: Payer: Self-pay | Admitting: Orthopaedic Surgery

## 2012-11-23 DIAGNOSIS — M549 Dorsalgia, unspecified: Secondary | ICD-10-CM

## 2012-12-07 ENCOUNTER — Ambulatory Visit
Admission: RE | Admit: 2012-12-07 | Discharge: 2012-12-07 | Disposition: A | Discharge status: Home / Self Care | Payer: BLUE CROSS/BLUE SHIELD | Source: Ambulatory Visit | Attending: Orthopaedic Surgery | Admitting: Orthopaedic Surgery

## 2012-12-07 DIAGNOSIS — M549 Dorsalgia, unspecified: Secondary | ICD-10-CM

## 2012-12-07 DIAGNOSIS — M5126 Other intervertebral disc displacement, lumbar region: Secondary | ICD-10-CM | POA: Diagnosis not present

## 2012-12-07 DIAGNOSIS — M48061 Spinal stenosis, lumbar region without neurogenic claudication: Secondary | ICD-10-CM | POA: Diagnosis not present

## 2012-12-07 DIAGNOSIS — M47817 Spondylosis without myelopathy or radiculopathy, lumbosacral region: Secondary | ICD-10-CM | POA: Diagnosis not present

## 2012-12-09 DIAGNOSIS — M545 Low back pain: Secondary | ICD-10-CM | POA: Diagnosis not present

## 2012-12-09 DIAGNOSIS — M5126 Other intervertebral disc displacement, lumbar region: Secondary | ICD-10-CM | POA: Diagnosis not present

## 2012-12-13 DIAGNOSIS — N184 Chronic kidney disease, stage 4 (severe): Secondary | ICD-10-CM | POA: Diagnosis not present

## 2012-12-13 DIAGNOSIS — M109 Gout, unspecified: Secondary | ICD-10-CM | POA: Diagnosis not present

## 2012-12-13 DIAGNOSIS — I129 Hypertensive chronic kidney disease with stage 1 through stage 4 chronic kidney disease, or unspecified chronic kidney disease: Secondary | ICD-10-CM | POA: Diagnosis not present

## 2012-12-13 DIAGNOSIS — Z23 Encounter for immunization: Secondary | ICD-10-CM | POA: Diagnosis not present

## 2012-12-13 DIAGNOSIS — E785 Hyperlipidemia, unspecified: Secondary | ICD-10-CM | POA: Diagnosis not present

## 2012-12-13 DIAGNOSIS — I10 Essential (primary) hypertension: Secondary | ICD-10-CM | POA: Diagnosis not present

## 2012-12-29 DIAGNOSIS — M5106 Intervertebral disc disorders with myelopathy, lumbar region: Secondary | ICD-10-CM | POA: Diagnosis not present

## 2012-12-29 DIAGNOSIS — M4716 Other spondylosis with myelopathy, lumbar region: Secondary | ICD-10-CM | POA: Diagnosis not present

## 2013-01-03 ENCOUNTER — Encounter: Payer: Medicare Other | Attending: Nephrology | Admitting: *Deleted

## 2013-01-03 ENCOUNTER — Encounter: Payer: Self-pay | Admitting: *Deleted

## 2013-01-03 VITALS — Ht 69.5 in | Wt 170.1 lb

## 2013-01-03 DIAGNOSIS — N184 Chronic kidney disease, stage 4 (severe): Secondary | ICD-10-CM

## 2013-01-03 DIAGNOSIS — Z713 Dietary counseling and surveillance: Secondary | ICD-10-CM | POA: Insufficient documentation

## 2013-01-03 DIAGNOSIS — I120 Hypertensive chronic kidney disease with stage 5 chronic kidney disease or end stage renal disease: Secondary | ICD-10-CM | POA: Insufficient documentation

## 2013-01-03 DIAGNOSIS — N185 Chronic kidney disease, stage 5: Secondary | ICD-10-CM | POA: Diagnosis not present

## 2013-01-03 NOTE — Progress Notes (Signed)
  Medical Nutrition Therapy:  Appt start time: 1500 end time:  1600.  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 States he spends his days with his family and friends. Long history of renal glumerular nephritis. States he has been on HTN medications since his mid 34's and cholesterol medications since mid 40's. Wife is a retired Biomedical scientist. His activity includes walking, chopping wood and he has a big 2 acre garden as well as doing all of the house work.   Preferred Learning Style:   No preference indicated   Learning Readiness:   Ready  Change in progress  MEDICATIONS: see list   DIETARY INTAKE:  24-hr recall:  B ( AM): 2 pieces buttered home made toast, occasionally 1 slice bacon OR 2 eggs, home fries OR pancakes or waffles, 1/2 cup coffee, occasionally juice Snk ( AM): none  L ( PM): home grown lettuce, left over vegetables from previous dinner, occasionally just toast and peanut butter, may skip,water to drink Snk ( PM): not usually unless PNB crackers (light on the PNB), or chips and salsa D ( PM): lean meat, starch and vegetable type meal, fish twice a week.water Snk ( PM): raisins, OR popsicle Beverages: water, 1/2 cup coffee  Usual physical activity: very active in yard and does all of the housework  Intervention:  Nutrition counseling provided for modified protein, sodium and phosphorus intake. Reviewed rationale for these restrictions and provided Handbook with these modifications provided for each food group. His eating habits are appropriate for the protein and sodium restrictions already. Both patient and wife expressed good understanding of information taught through return demonstration. His phosphorus levels appear appropriate so he does not plan to implement that restriction at this time.   Teaching Method Utilized: all of the following Visual Auditory Hands on  Handouts given during visit include: [employer] "Choose A Meal", 2011  Barriers to learning/adherence to lifestyle change: none  Demonstrated degree of understanding via:  Teach Back   Monitoring/Evaluation:  Dietary intake, exercise, reading food labels, and body weight prn.

## 2013-01-18 NOTE — Patient Instructions (Addendum)
Plan: Contine eating 3 meals and 1-2 snacks a day with variety of all food groups within the following modifications  Consider limiting protein to 3 oz at lunch and supper Consider limiting sodium to 2000 mg/day by aiming for 500 mg/ meal and balance of 500 mg for all snacks

## 2013-01-25 ENCOUNTER — Ambulatory Visit: Payer: Medicare Other | Admitting: Dietician

## 2013-01-26 DIAGNOSIS — C61 Malignant neoplasm of prostate: Secondary | ICD-10-CM | POA: Diagnosis not present

## 2013-01-31 DIAGNOSIS — Z85828 Personal history of other malignant neoplasm of skin: Secondary | ICD-10-CM | POA: Diagnosis not present

## 2013-01-31 DIAGNOSIS — D1801 Hemangioma of skin and subcutaneous tissue: Secondary | ICD-10-CM | POA: Diagnosis not present

## 2013-01-31 DIAGNOSIS — L570 Actinic keratosis: Secondary | ICD-10-CM | POA: Diagnosis not present

## 2013-01-31 DIAGNOSIS — D2370 Other benign neoplasm of skin of unspecified lower limb, including hip: Secondary | ICD-10-CM | POA: Diagnosis not present

## 2013-01-31 DIAGNOSIS — D239 Other benign neoplasm of skin, unspecified: Secondary | ICD-10-CM | POA: Diagnosis not present

## 2013-01-31 DIAGNOSIS — L821 Other seborrheic keratosis: Secondary | ICD-10-CM | POA: Diagnosis not present

## 2013-02-07 DIAGNOSIS — R361 Hematospermia: Secondary | ICD-10-CM | POA: Diagnosis not present

## 2013-02-07 DIAGNOSIS — C61 Malignant neoplasm of prostate: Secondary | ICD-10-CM | POA: Diagnosis not present

## 2013-04-05 DIAGNOSIS — E785 Hyperlipidemia, unspecified: Secondary | ICD-10-CM | POA: Diagnosis not present

## 2013-04-05 DIAGNOSIS — R7301 Impaired fasting glucose: Secondary | ICD-10-CM | POA: Diagnosis not present

## 2013-04-05 DIAGNOSIS — Z136 Encounter for screening for cardiovascular disorders: Secondary | ICD-10-CM | POA: Diagnosis not present

## 2013-04-05 DIAGNOSIS — M109 Gout, unspecified: Secondary | ICD-10-CM | POA: Diagnosis not present

## 2013-04-05 DIAGNOSIS — I10 Essential (primary) hypertension: Secondary | ICD-10-CM | POA: Diagnosis not present

## 2013-04-05 DIAGNOSIS — I739 Peripheral vascular disease, unspecified: Secondary | ICD-10-CM | POA: Diagnosis not present

## 2013-04-22 DIAGNOSIS — R7301 Impaired fasting glucose: Secondary | ICD-10-CM | POA: Diagnosis not present

## 2013-04-22 DIAGNOSIS — E785 Hyperlipidemia, unspecified: Secondary | ICD-10-CM | POA: Diagnosis not present

## 2013-04-22 DIAGNOSIS — I10 Essential (primary) hypertension: Secondary | ICD-10-CM | POA: Diagnosis not present

## 2013-04-22 DIAGNOSIS — M109 Gout, unspecified: Secondary | ICD-10-CM | POA: Diagnosis not present

## 2013-04-22 DIAGNOSIS — N184 Chronic kidney disease, stage 4 (severe): Secondary | ICD-10-CM | POA: Diagnosis not present

## 2013-04-22 DIAGNOSIS — Z79899 Other long term (current) drug therapy: Secondary | ICD-10-CM | POA: Diagnosis not present

## 2013-04-22 DIAGNOSIS — Z0000 Encounter for general adult medical examination without abnormal findings: Secondary | ICD-10-CM | POA: Diagnosis not present

## 2013-04-22 DIAGNOSIS — C61 Malignant neoplasm of prostate: Secondary | ICD-10-CM | POA: Diagnosis not present

## 2013-04-28 DIAGNOSIS — Z1212 Encounter for screening for malignant neoplasm of rectum: Secondary | ICD-10-CM | POA: Diagnosis not present

## 2013-05-03 DIAGNOSIS — N2581 Secondary hyperparathyroidism of renal origin: Secondary | ICD-10-CM | POA: Diagnosis not present

## 2013-05-03 DIAGNOSIS — D631 Anemia in chronic kidney disease: Secondary | ICD-10-CM | POA: Diagnosis not present

## 2013-05-03 DIAGNOSIS — I129 Hypertensive chronic kidney disease with stage 1 through stage 4 chronic kidney disease, or unspecified chronic kidney disease: Secondary | ICD-10-CM | POA: Diagnosis not present

## 2013-05-03 DIAGNOSIS — N183 Chronic kidney disease, stage 3 unspecified: Secondary | ICD-10-CM | POA: Diagnosis not present

## 2013-05-16 DIAGNOSIS — M25519 Pain in unspecified shoulder: Secondary | ICD-10-CM | POA: Diagnosis not present

## 2013-06-09 DIAGNOSIS — H35369 Drusen (degenerative) of macula, unspecified eye: Secondary | ICD-10-CM | POA: Diagnosis not present

## 2013-06-09 DIAGNOSIS — H2510 Age-related nuclear cataract, unspecified eye: Secondary | ICD-10-CM | POA: Diagnosis not present

## 2013-06-09 DIAGNOSIS — H353190 Nonexudative age-related macular degeneration, unspecified eye, stage unspecified: Secondary | ICD-10-CM | POA: Diagnosis not present

## 2013-06-09 DIAGNOSIS — H524 Presbyopia: Secondary | ICD-10-CM | POA: Diagnosis not present

## 2013-07-27 DIAGNOSIS — Z8546 Personal history of malignant neoplasm of prostate: Secondary | ICD-10-CM | POA: Diagnosis not present

## 2013-07-27 DIAGNOSIS — C61 Malignant neoplasm of prostate: Secondary | ICD-10-CM | POA: Diagnosis not present

## 2013-07-27 DIAGNOSIS — Z923 Personal history of irradiation: Secondary | ICD-10-CM | POA: Diagnosis not present

## 2013-08-02 DIAGNOSIS — M25519 Pain in unspecified shoulder: Secondary | ICD-10-CM | POA: Diagnosis not present

## 2013-08-02 DIAGNOSIS — M25569 Pain in unspecified knee: Secondary | ICD-10-CM | POA: Diagnosis not present

## 2013-08-30 DIAGNOSIS — M545 Low back pain, unspecified: Secondary | ICD-10-CM | POA: Diagnosis not present

## 2013-08-30 DIAGNOSIS — M25519 Pain in unspecified shoulder: Secondary | ICD-10-CM | POA: Diagnosis not present

## 2013-08-30 DIAGNOSIS — M25569 Pain in unspecified knee: Secondary | ICD-10-CM | POA: Diagnosis not present

## 2013-08-30 DIAGNOSIS — M5126 Other intervertebral disc displacement, lumbar region: Secondary | ICD-10-CM | POA: Diagnosis not present

## 2013-10-17 DIAGNOSIS — N2581 Secondary hyperparathyroidism of renal origin: Secondary | ICD-10-CM | POA: Diagnosis not present

## 2013-10-17 DIAGNOSIS — I129 Hypertensive chronic kidney disease with stage 1 through stage 4 chronic kidney disease, or unspecified chronic kidney disease: Secondary | ICD-10-CM | POA: Diagnosis not present

## 2013-10-17 DIAGNOSIS — N184 Chronic kidney disease, stage 4 (severe): Secondary | ICD-10-CM | POA: Diagnosis not present

## 2013-10-17 DIAGNOSIS — N039 Chronic nephritic syndrome with unspecified morphologic changes: Secondary | ICD-10-CM | POA: Diagnosis not present

## 2013-10-17 DIAGNOSIS — D631 Anemia in chronic kidney disease: Secondary | ICD-10-CM | POA: Diagnosis not present

## 2013-10-17 DIAGNOSIS — M109 Gout, unspecified: Secondary | ICD-10-CM | POA: Diagnosis not present

## 2013-12-07 DIAGNOSIS — L821 Other seborrheic keratosis: Secondary | ICD-10-CM | POA: Diagnosis not present

## 2013-12-07 DIAGNOSIS — L218 Other seborrheic dermatitis: Secondary | ICD-10-CM | POA: Diagnosis not present

## 2013-12-07 DIAGNOSIS — L570 Actinic keratosis: Secondary | ICD-10-CM | POA: Diagnosis not present

## 2013-12-07 DIAGNOSIS — Z85828 Personal history of other malignant neoplasm of skin: Secondary | ICD-10-CM | POA: Diagnosis not present

## 2013-12-16 DIAGNOSIS — N184 Chronic kidney disease, stage 4 (severe): Secondary | ICD-10-CM | POA: Diagnosis not present

## 2013-12-16 DIAGNOSIS — Z1389 Encounter for screening for other disorder: Secondary | ICD-10-CM | POA: Diagnosis not present

## 2013-12-16 DIAGNOSIS — Z23 Encounter for immunization: Secondary | ICD-10-CM | POA: Diagnosis not present

## 2013-12-16 DIAGNOSIS — I10 Essential (primary) hypertension: Secondary | ICD-10-CM | POA: Diagnosis not present

## 2013-12-16 DIAGNOSIS — E785 Hyperlipidemia, unspecified: Secondary | ICD-10-CM | POA: Diagnosis not present

## 2013-12-16 DIAGNOSIS — R7301 Impaired fasting glucose: Secondary | ICD-10-CM | POA: Diagnosis not present

## 2013-12-16 DIAGNOSIS — Z6824 Body mass index (BMI) 24.0-24.9, adult: Secondary | ICD-10-CM | POA: Diagnosis not present

## 2014-03-28 DIAGNOSIS — N184 Chronic kidney disease, stage 4 (severe): Secondary | ICD-10-CM | POA: Diagnosis not present

## 2014-03-28 DIAGNOSIS — N189 Chronic kidney disease, unspecified: Secondary | ICD-10-CM | POA: Diagnosis not present

## 2014-03-28 DIAGNOSIS — D631 Anemia in chronic kidney disease: Secondary | ICD-10-CM | POA: Diagnosis not present

## 2014-03-28 DIAGNOSIS — M109 Gout, unspecified: Secondary | ICD-10-CM | POA: Diagnosis not present

## 2014-03-28 DIAGNOSIS — N2581 Secondary hyperparathyroidism of renal origin: Secondary | ICD-10-CM | POA: Diagnosis not present

## 2014-03-28 DIAGNOSIS — I129 Hypertensive chronic kidney disease with stage 1 through stage 4 chronic kidney disease, or unspecified chronic kidney disease: Secondary | ICD-10-CM | POA: Diagnosis not present

## 2014-04-25 DIAGNOSIS — I10 Essential (primary) hypertension: Secondary | ICD-10-CM | POA: Diagnosis not present

## 2014-04-25 DIAGNOSIS — R7301 Impaired fasting glucose: Secondary | ICD-10-CM | POA: Diagnosis not present

## 2014-04-25 DIAGNOSIS — I739 Peripheral vascular disease, unspecified: Secondary | ICD-10-CM | POA: Diagnosis not present

## 2014-04-25 DIAGNOSIS — Z008 Encounter for other general examination: Secondary | ICD-10-CM | POA: Diagnosis not present

## 2014-04-25 DIAGNOSIS — M109 Gout, unspecified: Secondary | ICD-10-CM | POA: Diagnosis not present

## 2014-04-25 DIAGNOSIS — E785 Hyperlipidemia, unspecified: Secondary | ICD-10-CM | POA: Diagnosis not present

## 2014-05-02 DIAGNOSIS — Z6824 Body mass index (BMI) 24.0-24.9, adult: Secondary | ICD-10-CM | POA: Diagnosis not present

## 2014-05-02 DIAGNOSIS — R7301 Impaired fasting glucose: Secondary | ICD-10-CM | POA: Diagnosis not present

## 2014-05-02 DIAGNOSIS — I739 Peripheral vascular disease, unspecified: Secondary | ICD-10-CM | POA: Diagnosis not present

## 2014-05-02 DIAGNOSIS — Z0000 Encounter for general adult medical examination without abnormal findings: Secondary | ICD-10-CM | POA: Diagnosis not present

## 2014-05-02 DIAGNOSIS — R011 Cardiac murmur, unspecified: Secondary | ICD-10-CM | POA: Diagnosis not present

## 2014-05-02 DIAGNOSIS — N184 Chronic kidney disease, stage 4 (severe): Secondary | ICD-10-CM | POA: Diagnosis not present

## 2014-05-02 DIAGNOSIS — I10 Essential (primary) hypertension: Secondary | ICD-10-CM | POA: Diagnosis not present

## 2014-05-02 DIAGNOSIS — C61 Malignant neoplasm of prostate: Secondary | ICD-10-CM | POA: Diagnosis not present

## 2014-05-02 DIAGNOSIS — Z95828 Presence of other vascular implants and grafts: Secondary | ICD-10-CM | POA: Diagnosis not present

## 2014-05-02 DIAGNOSIS — E785 Hyperlipidemia, unspecified: Secondary | ICD-10-CM | POA: Diagnosis not present

## 2014-05-02 DIAGNOSIS — M109 Gout, unspecified: Secondary | ICD-10-CM | POA: Diagnosis not present

## 2014-05-02 DIAGNOSIS — Z1389 Encounter for screening for other disorder: Secondary | ICD-10-CM | POA: Diagnosis not present

## 2014-05-10 DIAGNOSIS — Z95828 Presence of other vascular implants and grafts: Secondary | ICD-10-CM | POA: Diagnosis not present

## 2014-06-21 DIAGNOSIS — S12111A Posterior displaced Type II dens fracture, initial encounter for closed fracture: Secondary | ICD-10-CM | POA: Diagnosis not present

## 2014-06-21 DIAGNOSIS — N289 Disorder of kidney and ureter, unspecified: Secondary | ICD-10-CM | POA: Diagnosis not present

## 2014-06-21 DIAGNOSIS — S0990XA Unspecified injury of head, initial encounter: Secondary | ICD-10-CM | POA: Diagnosis not present

## 2014-06-21 DIAGNOSIS — M542 Cervicalgia: Secondary | ICD-10-CM | POA: Diagnosis not present

## 2014-06-21 DIAGNOSIS — R51 Headache: Secondary | ICD-10-CM | POA: Diagnosis not present

## 2014-06-21 DIAGNOSIS — Z5320 Procedure and treatment not carried out because of patient's decision for unspecified reasons: Secondary | ICD-10-CM | POA: Diagnosis not present

## 2014-06-21 DIAGNOSIS — I10 Essential (primary) hypertension: Secondary | ICD-10-CM | POA: Diagnosis not present

## 2014-06-21 DIAGNOSIS — I119 Hypertensive heart disease without heart failure: Secondary | ICD-10-CM | POA: Diagnosis not present

## 2014-06-21 DIAGNOSIS — R208 Other disturbances of skin sensation: Secondary | ICD-10-CM | POA: Diagnosis not present

## 2014-06-23 DIAGNOSIS — R69 Illness, unspecified: Secondary | ICD-10-CM | POA: Diagnosis not present

## 2014-06-24 ENCOUNTER — Other Ambulatory Visit: Payer: Self-pay

## 2014-06-24 ENCOUNTER — Encounter (HOSPITAL_COMMUNITY): Payer: Self-pay | Admitting: Emergency Medicine

## 2014-06-24 ENCOUNTER — Inpatient Hospital Stay (HOSPITAL_COMMUNITY)
Admission: EM | Admit: 2014-06-24 | Discharge: 2014-06-27 | DRG: 552 | Disposition: A | Discharge status: Home / Self Care | Payer: Medicare Other | Attending: Neurosurgery | Admitting: Neurosurgery

## 2014-06-24 ENCOUNTER — Inpatient Hospital Stay (HOSPITAL_COMMUNITY): Payer: Medicare Other

## 2014-06-24 DIAGNOSIS — E785 Hyperlipidemia, unspecified: Secondary | ICD-10-CM | POA: Diagnosis present

## 2014-06-24 DIAGNOSIS — S12100A Unspecified displaced fracture of second cervical vertebra, initial encounter for closed fracture: Secondary | ICD-10-CM

## 2014-06-24 DIAGNOSIS — Y9314 Activity, water aerobics and water exercise: Secondary | ICD-10-CM

## 2014-06-24 DIAGNOSIS — Y92832 Beach as the place of occurrence of the external cause: Secondary | ICD-10-CM | POA: Diagnosis not present

## 2014-06-24 DIAGNOSIS — S12110A Anterior displaced Type II dens fracture, initial encounter for closed fracture: Secondary | ICD-10-CM | POA: Diagnosis present

## 2014-06-24 DIAGNOSIS — Z8546 Personal history of malignant neoplasm of prostate: Secondary | ICD-10-CM

## 2014-06-24 DIAGNOSIS — I10 Essential (primary) hypertension: Secondary | ICD-10-CM | POA: Diagnosis present

## 2014-06-24 DIAGNOSIS — S12101A Unspecified nondisplaced fracture of second cervical vertebra, initial encounter for closed fracture: Secondary | ICD-10-CM | POA: Diagnosis not present

## 2014-06-24 DIAGNOSIS — S129XXA Fracture of neck, unspecified, initial encounter: Secondary | ICD-10-CM

## 2014-06-24 DIAGNOSIS — I739 Peripheral vascular disease, unspecified: Secondary | ICD-10-CM | POA: Diagnosis present

## 2014-06-24 DIAGNOSIS — Z87891 Personal history of nicotine dependence: Secondary | ICD-10-CM | POA: Diagnosis not present

## 2014-06-24 DIAGNOSIS — R208 Other disturbances of skin sensation: Secondary | ICD-10-CM | POA: Diagnosis not present

## 2014-06-24 DIAGNOSIS — R52 Pain, unspecified: Secondary | ICD-10-CM

## 2014-06-24 DIAGNOSIS — K219 Gastro-esophageal reflux disease without esophagitis: Secondary | ICD-10-CM | POA: Diagnosis present

## 2014-06-24 HISTORY — DX: Unspecified displaced fracture of second cervical vertebra, initial encounter for closed fracture: S12.100A

## 2014-06-24 LAB — CBC WITH DIFFERENTIAL/PLATELET
BASOS PCT: 0 % (ref 0–1)
Basophils Absolute: 0 10*3/uL (ref 0.0–0.1)
Basophils Absolute: 0 10*3/uL (ref 0.0–0.1)
Basophils Relative: 0 % (ref 0–1)
EOS ABS: 0.2 10*3/uL (ref 0.0–0.7)
EOS ABS: 0.2 10*3/uL (ref 0.0–0.7)
EOS PCT: 2 % (ref 0–5)
Eosinophils Relative: 2 % (ref 0–5)
HCT: 38.1 % — ABNORMAL LOW (ref 39.0–52.0)
HEMATOCRIT: 40 % (ref 39.0–52.0)
HEMOGLOBIN: 12.9 g/dL — AB (ref 13.0–17.0)
Hemoglobin: 13.4 g/dL (ref 13.0–17.0)
LYMPHS ABS: 1.1 10*3/uL (ref 0.7–4.0)
Lymphocytes Relative: 15 % (ref 12–46)
Lymphocytes Relative: 15 % (ref 12–46)
Lymphs Abs: 1.2 10*3/uL (ref 0.7–4.0)
MCH: 32.2 pg (ref 26.0–34.0)
MCH: 32.4 pg (ref 26.0–34.0)
MCHC: 33.5 g/dL (ref 30.0–36.0)
MCHC: 33.9 g/dL (ref 30.0–36.0)
MCV: 96.2 fL (ref 78.0–100.0)
MCV: 95.7 fL (ref 78.0–100.0)
MONOS PCT: 9 % (ref 3–12)
Monocytes Absolute: 0.7 10*3/uL (ref 0.1–1.0)
Monocytes Absolute: 0.8 10*3/uL (ref 0.1–1.0)
Monocytes Relative: 10 % (ref 3–12)
Neutro Abs: 5.5 10*3/uL (ref 1.7–7.7)
Neutro Abs: 5.6 10*3/uL (ref 1.7–7.7)
Neutrophils Relative %: 74 % (ref 43–77)
Neutrophils Relative %: 73 % (ref 43–77)
PLATELETS: 149 10*3/uL — AB (ref 150–400)
PLATELETS: 154 10*3/uL (ref 150–400)
RBC: 4.16 MIL/uL — ABNORMAL LOW (ref 4.22–5.81)
RBC: 3.98 MIL/uL — ABNORMAL LOW (ref 4.22–5.81)
RDW: 14.5 % (ref 11.5–15.5)
RDW: 14.4 % (ref 11.5–15.5)
WBC: 7.6 10*3/uL (ref 4.0–10.5)
WBC: 7.7 10*3/uL (ref 4.0–10.5)

## 2014-06-24 LAB — COMPREHENSIVE METABOLIC PANEL
ALT: 18 U/L (ref 0–53)
AST: 32 U/L (ref 0–37)
Albumin: 3 g/dL — ABNORMAL LOW (ref 3.5–5.2)
Alkaline Phosphatase: 46 U/L (ref 39–117)
Anion gap: 10 (ref 5–15)
BUN: 29 mg/dL — ABNORMAL HIGH (ref 6–23)
CALCIUM: 9.1 mg/dL (ref 8.4–10.5)
CO2: 23 mmol/L (ref 19–32)
CREATININE: 1.64 mg/dL — AB (ref 0.50–1.35)
Chloride: 103 mmol/L (ref 96–112)
GFR, EST AFRICAN AMERICAN: 47 mL/min — AB (ref >90)
GFR, EST NON AFRICAN AMERICAN: 40 mL/min — AB (ref >90)
GLUCOSE: 101 mg/dL — AB (ref 70–99)
Potassium: 4.6 mmol/L (ref 3.5–5.1)
Sodium: 136 mmol/L (ref 135–145)
Total Bilirubin: 0.7 mg/dL (ref 0.3–1.2)
Total Protein: 6.9 g/dL (ref 6.0–8.3)

## 2014-06-24 LAB — BASIC METABOLIC PANEL
Anion gap: 13 (ref 5–15)
BUN: 29 mg/dL — ABNORMAL HIGH (ref 6–23)
CO2: 21 mmol/L (ref 19–32)
Calcium: 9.4 mg/dL (ref 8.4–10.5)
Chloride: 103 mmol/L (ref 96–112)
Creatinine, Ser: 1.72 mg/dL — ABNORMAL HIGH (ref 0.50–1.35)
GFR calc Af Amer: 44 mL/min — ABNORMAL LOW (ref >90)
GFR, EST NON AFRICAN AMERICAN: 38 mL/min — AB (ref >90)
GLUCOSE: 91 mg/dL (ref 70–99)
POTASSIUM: 4.5 mmol/L (ref 3.5–5.1)
Sodium: 137 mmol/L (ref 135–145)

## 2014-06-24 LAB — PROTIME-INR
INR: 1.1 (ref 0.00–1.49)
Prothrombin Time: 14.3 seconds (ref 11.6–15.2)

## 2014-06-24 LAB — APTT: aPTT: 40 seconds — ABNORMAL HIGH (ref 24–37)

## 2014-06-24 MED ORDER — ONDANSETRON HCL 4 MG PO TABS: 4.0000 mg | ORAL_TABLET | Freq: Four times a day (QID) | ORAL | Status: DC | PRN

## 2014-06-24 MED ORDER — FUROSEMIDE 40 MG PO TABS
40.0000 mg | ORAL_TABLET | Freq: Every day | ORAL | Status: DC
  Administered 2014-06-24 – 2014-06-27 (×4): 40 mg via ORAL
  Filled 2014-06-24 (×4): qty 1

## 2014-06-24 MED ORDER — FENOFIBRATE 54 MG PO TABS
54.0000 mg | ORAL_TABLET | Freq: Every day | ORAL | Status: DC
  Administered 2014-06-24 – 2014-06-27 (×4): 54 mg via ORAL
  Filled 2014-06-24 (×4): qty 1

## 2014-06-24 MED ORDER — POTASSIUM CHLORIDE IN NACL 20-0.9 MEQ/L-% IV SOLN
INTRAVENOUS | Status: DC
  Administered 2014-06-24 – 2014-06-25 (×3): via INTRAVENOUS
  Filled 2014-06-24 (×4): qty 1000

## 2014-06-24 MED ORDER — VITAMIN D 1000 UNITS PO TABS
2000.0000 [IU] | ORAL_TABLET | Freq: Every day | ORAL | Status: DC
  Administered 2014-06-24 – 2014-06-27 (×3): 2000 [IU] via ORAL
  Filled 2014-06-24 (×4): qty 2

## 2014-06-24 MED ORDER — HYDROMORPHONE HCL 1 MG/ML IJ SOLN
0.5000 mg | INTRAMUSCULAR | Status: DC | PRN
  Administered 2014-06-24: 0.5 mg via INTRAVENOUS
  Filled 2014-06-24: qty 1

## 2014-06-24 MED ORDER — ONDANSETRON HCL 4 MG/2ML IJ SOLN: 4.0000 mg | Freq: Four times a day (QID) | INTRAMUSCULAR | Status: DC | PRN

## 2014-06-24 MED ORDER — HYDROMORPHONE HCL 1 MG/ML IJ SOLN
1.0000 mg | INTRAMUSCULAR | Status: DC | PRN
  Administered 2014-06-24 – 2014-06-25 (×2): 1 mg via INTRAVENOUS
  Filled 2014-06-24 (×3): qty 1

## 2014-06-24 MED ORDER — OXYMETAZOLINE HCL 0.05 % NA SOLN
1.0000 | Freq: Once | NASAL | Status: AC
  Administered 2014-06-24: 1 via NASAL
  Filled 2014-06-24: qty 15

## 2014-06-24 MED ORDER — HEPARIN SODIUM (PORCINE) 5000 UNIT/ML IJ SOLN
5000.0000 [IU] | Freq: Three times a day (TID) | INTRAMUSCULAR | Status: DC
  Administered 2014-06-24 – 2014-06-26 (×9): 5000 [IU] via SUBCUTANEOUS
  Filled 2014-06-24 (×10): qty 1

## 2014-06-24 MED ORDER — ACETAMINOPHEN 325 MG PO TABS
650.0000 mg | ORAL_TABLET | Freq: Four times a day (QID) | ORAL | Status: DC | PRN
  Administered 2014-06-27: 650 mg via ORAL
  Filled 2014-06-24: qty 2

## 2014-06-24 MED ORDER — DOCUSATE SODIUM 100 MG PO CAPS: 100.0000 mg | ORAL_CAPSULE | Freq: Every day | ORAL | Status: DC | PRN

## 2014-06-24 MED ORDER — BISACODYL 5 MG PO TBEC: 5.0000 mg | DELAYED_RELEASE_TABLET | Freq: Every day | ORAL | Status: DC | PRN

## 2014-06-24 MED ORDER — AMLODIPINE BESYLATE 10 MG PO TABS
10.0000 mg | ORAL_TABLET | Freq: Every day | ORAL | Status: DC
  Administered 2014-06-24 – 2014-06-27 (×4): 10 mg via ORAL
  Filled 2014-06-24 (×4): qty 1

## 2014-06-24 MED ORDER — ATENOLOL 100 MG PO TABS
100.0000 mg | ORAL_TABLET | Freq: Every day | ORAL | Status: DC
  Administered 2014-06-24 – 2014-06-27 (×3): 100 mg via ORAL
  Filled 2014-06-24 (×3): qty 1

## 2014-06-24 MED ORDER — ALLOPURINOL 100 MG PO TABS
300.0000 mg | ORAL_TABLET | Freq: Every day | ORAL | Status: DC
  Administered 2014-06-24 – 2014-06-27 (×4): 300 mg via ORAL
  Filled 2014-06-24 (×4): qty 3

## 2014-06-24 MED ORDER — ROSUVASTATIN CALCIUM 10 MG PO TABS
10.0000 mg | ORAL_TABLET | Freq: Every day | ORAL | Status: DC
  Administered 2014-06-24 – 2014-06-26 (×3): 10 mg via ORAL
  Filled 2014-06-24 (×4): qty 1

## 2014-06-24 MED ORDER — SODIUM CHLORIDE 0.9 % IJ SOLN: 3.0000 mL | INTRAMUSCULAR | Status: DC | PRN

## 2014-06-24 MED ORDER — PREGABALIN 50 MG PO CAPS
50.0000 mg | ORAL_CAPSULE | Freq: Two times a day (BID) | ORAL | Status: DC
  Administered 2014-06-24 – 2014-06-27 (×7): 50 mg via ORAL
  Filled 2014-06-24 (×7): qty 1

## 2014-06-24 MED ORDER — PANTOPRAZOLE SODIUM 40 MG PO TBEC
40.0000 mg | DELAYED_RELEASE_TABLET | Freq: Every day | ORAL | Status: DC
  Administered 2014-06-24 – 2014-06-27 (×4): 40 mg via ORAL
  Filled 2014-06-24 (×4): qty 1

## 2014-06-24 MED ORDER — DOCUSATE SODIUM 100 MG PO CAPS: 100.0000 mg | ORAL_CAPSULE | ORAL | Status: DC

## 2014-06-24 MED ORDER — SODIUM CHLORIDE 0.9 % IV SOLN: 250.0000 mL | INTRAVENOUS | Status: DC | PRN

## 2014-06-24 MED ORDER — SENNOSIDES-DOCUSATE SODIUM 8.6-50 MG PO TABS
1.0000 | ORAL_TABLET | Freq: Every evening | ORAL | Status: DC | PRN
Start: 2014-06-24 — End: 2014-06-27
  Administered 2014-06-26: 1 via ORAL
  Filled 2014-06-24: qty 1

## 2014-06-24 MED ORDER — VITAMIN D 50 MCG (2000 UT) PO CAPS: 1.0000 | ORAL_CAPSULE | Freq: Every day | ORAL | Status: DC

## 2014-06-24 MED ORDER — OXYCODONE HCL 5 MG PO TABS
5.0000 mg | ORAL_TABLET | ORAL | Status: DC | PRN
  Administered 2014-06-24 – 2014-06-26 (×10): 5 mg via ORAL
  Filled 2014-06-24 (×11): qty 1

## 2014-06-24 MED ORDER — ASPIRIN 81 MG PO TABS: 81.0000 mg | ORAL_TABLET | Freq: Every day | ORAL | Status: DC

## 2014-06-24 MED ORDER — ROSUVASTATIN CALCIUM 20 MG PO TABS: 20.0000 mg | ORAL_TABLET | Freq: Every day | ORAL | Status: DC

## 2014-06-24 MED ORDER — IRBESARTAN 300 MG PO TABS
300.0000 mg | ORAL_TABLET | Freq: Every day | ORAL | Status: DC
  Administered 2014-06-24 – 2014-06-27 (×4): 300 mg via ORAL
  Filled 2014-06-24 (×4): qty 1

## 2014-06-24 MED ORDER — SODIUM CHLORIDE 0.9 % IJ SOLN
3.0000 mL | Freq: Two times a day (BID) | INTRAMUSCULAR | Status: DC
  Administered 2014-06-24: 3 mL via INTRAVENOUS

## 2014-06-24 MED ORDER — ACETAMINOPHEN 650 MG RE SUPP: 650.0000 mg | Freq: Four times a day (QID) | RECTAL | Status: DC | PRN

## 2014-06-24 MED ORDER — ASPIRIN EC 81 MG PO TBEC
81.0000 mg | DELAYED_RELEASE_TABLET | Freq: Every day | ORAL | Status: DC
  Administered 2014-06-24 – 2014-06-27 (×4): 81 mg via ORAL
  Filled 2014-06-24 (×4): qty 1

## 2014-06-24 NOTE — ED Notes (Signed)
Dr. Christella Noa at the bedside speaking with the pt.
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
## 2014-06-24 NOTE — ED Notes (Signed)
Dr. Colin Rhein reminded that pt. Needs pain medication.  Waiting for orders
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
## 2014-06-24 NOTE — ED Notes (Signed)
Dr. Christella Noa at the bedside.

## 2014-06-24 NOTE — ED Notes (Signed)
Reported to Dr. Colin Rhein that pt. Is having increased pain to his hands.  Requested pain medication for pt.
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
## 2014-06-25 ENCOUNTER — Inpatient Hospital Stay (HOSPITAL_COMMUNITY): Payer: Medicare Other

## 2014-06-25 MED ORDER — DIAZEPAM 5 MG PO TABS
5.0000 mg | ORAL_TABLET | Freq: Four times a day (QID) | ORAL | Status: DC | PRN
  Administered 2014-06-25: 5 mg via ORAL
  Filled 2014-06-25 (×2): qty 1

## 2014-06-25 NOTE — Progress Notes (Signed)
Patient ID: Timothy [name], male   DOB: January 27, 1941, 74 y.o.   MRN: [id number] BP 135/58 mmHg  Pulse 52  Temp(Src) 98.8 F (37.1 C) (Oral)  Resp 20  SpO2 100% Alert and oriented x 4, speech is clear and fluent Perrl, full eom Moving all extremities well Tolerating a regular diet Will order another mri, we have not been able to download previous film May be up today

## 2014-06-26 ENCOUNTER — Encounter (HOSPITAL_COMMUNITY): Payer: Self-pay | Admitting: General Practice

## 2014-06-26 NOTE — Progress Notes (Signed)
CARE MANAGEMENT NOTE 06/26/2014  Patient:  Timothy [name], Timothy [name]   Account Number:  [id number]  Date Initiated:  06/26/2014  Documentation initiated by:  Lorne Skeens  Subjective/Objective Assessment:   Patient was admitted with a cervical fracture.  Lives at home with spouse.     Action/Plan:   Will follow for discharge needs.   Anticipated DC Date:  06/27/2014   Anticipated DC Plan:  HOME/SELF CARE         Choice offered to / List presented to:             Status of service:   Medicare Important Message given?   (If response is "NO", the following Medicare IM given date fields will be blank) Date Medicare IM given:   Medicare IM given by:   Date Additional Medicare IM given:   Additional Medicare IM given by:    Discharge Disposition:    Per UR Regulation:  Reviewed for med. necessity/level of care/duration of stay  If discussed at [place] of Stay Meetings, dates discussed:    Comments:

## 2014-06-26 NOTE — Progress Notes (Signed)
Patient ID: Timothy [name], male   DOB: 03-03-1940, 75 y.o.   MRN: [id number] BP 134/62 mmHg  Pulse 80  Temp(Src) 98.8 F (37.1 C) (Oral)  Resp 16  Ht 5\' 9"  (1.753 m)  Wt 76.522 kg (168 lb 11.2 oz)  BMI 24.90 kg/m2  SpO2 98% Alert and oriented x 4, memory and  Speech are normal Moving all extremities well Far less pain in the hands at this time Will discharge tomorrow.

## 2014-06-26 NOTE — Progress Notes (Signed)
UR complete.  Zaeden Lastinger RN, MSN 

## 2014-06-27 MED ORDER — TRAMADOL HCL 50 MG PO TABS: 50.0000 mg | ORAL_TABLET | Freq: Four times a day (QID) | ORAL | Status: DC | PRN

## 2014-06-27 NOTE — Progress Notes (Signed)
Discharge instructions given. Pt verbalized understanding and all questions were answered.  
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
## 2014-07-27 ENCOUNTER — Other Ambulatory Visit: Payer: Self-pay | Admitting: Neurosurgery

## 2014-07-27 DIAGNOSIS — S12101A Unspecified nondisplaced fracture of second cervical vertebra, initial encounter for closed fracture: Secondary | ICD-10-CM | POA: Diagnosis not present

## 2014-07-27 DIAGNOSIS — I10 Essential (primary) hypertension: Secondary | ICD-10-CM | POA: Diagnosis not present

## 2014-07-27 DIAGNOSIS — Z85828 Personal history of other malignant neoplasm of skin: Secondary | ICD-10-CM | POA: Diagnosis not present

## 2014-07-27 DIAGNOSIS — R21 Rash and other nonspecific skin eruption: Secondary | ICD-10-CM | POA: Diagnosis not present

## 2014-08-17 ENCOUNTER — Encounter: Payer: Self-pay | Admitting: Internal Medicine

## 2014-08-21 ENCOUNTER — Other Ambulatory Visit: Payer: Self-pay

## 2014-08-25 ENCOUNTER — Ambulatory Visit
Admission: RE | Admit: 2014-08-25 | Discharge: 2014-08-25 | Disposition: A | Discharge status: Home / Self Care | Payer: Medicare Other | Source: Ambulatory Visit | Attending: Neurosurgery | Admitting: Neurosurgery

## 2014-08-25 DIAGNOSIS — S12111D Posterior displaced Type II dens fracture, subsequent encounter for fracture with routine healing: Secondary | ICD-10-CM | POA: Diagnosis not present

## 2014-08-25 DIAGNOSIS — S12101A Unspecified nondisplaced fracture of second cervical vertebra, initial encounter for closed fracture: Secondary | ICD-10-CM

## 2014-08-25 DIAGNOSIS — M4313 Spondylolisthesis, cervicothoracic region: Secondary | ICD-10-CM | POA: Diagnosis not present

## 2014-08-25 DIAGNOSIS — M47813 Spondylosis without myelopathy or radiculopathy, cervicothoracic region: Secondary | ICD-10-CM | POA: Diagnosis not present

## 2014-08-29 DIAGNOSIS — S12101A Unspecified nondisplaced fracture of second cervical vertebra, initial encounter for closed fracture: Secondary | ICD-10-CM | POA: Diagnosis not present

## 2014-09-28 DIAGNOSIS — S12112G Nondisplaced Type II dens fracture, subsequent encounter for fracture with delayed healing: Secondary | ICD-10-CM | POA: Diagnosis not present

## 2014-10-18 DIAGNOSIS — C61 Malignant neoplasm of prostate: Secondary | ICD-10-CM | POA: Diagnosis not present

## 2014-10-18 DIAGNOSIS — Z923 Personal history of irradiation: Secondary | ICD-10-CM | POA: Diagnosis not present

## 2014-10-31 DIAGNOSIS — N2581 Secondary hyperparathyroidism of renal origin: Secondary | ICD-10-CM | POA: Diagnosis not present

## 2014-10-31 DIAGNOSIS — N184 Chronic kidney disease, stage 4 (severe): Secondary | ICD-10-CM | POA: Diagnosis not present

## 2014-10-31 DIAGNOSIS — S12112G Nondisplaced Type II dens fracture, subsequent encounter for fracture with delayed healing: Secondary | ICD-10-CM | POA: Diagnosis not present

## 2014-10-31 DIAGNOSIS — I10 Essential (primary) hypertension: Secondary | ICD-10-CM | POA: Diagnosis not present

## 2014-10-31 DIAGNOSIS — N189 Chronic kidney disease, unspecified: Secondary | ICD-10-CM | POA: Diagnosis not present

## 2014-10-31 DIAGNOSIS — I129 Hypertensive chronic kidney disease with stage 1 through stage 4 chronic kidney disease, or unspecified chronic kidney disease: Secondary | ICD-10-CM | POA: Diagnosis not present

## 2014-10-31 DIAGNOSIS — M109 Gout, unspecified: Secondary | ICD-10-CM | POA: Diagnosis not present

## 2014-10-31 DIAGNOSIS — D631 Anemia in chronic kidney disease: Secondary | ICD-10-CM | POA: Diagnosis not present

## 2014-11-11 DIAGNOSIS — Z23 Encounter for immunization: Secondary | ICD-10-CM | POA: Diagnosis not present

## 2014-11-14 DIAGNOSIS — L821 Other seborrheic keratosis: Secondary | ICD-10-CM | POA: Diagnosis not present

## 2014-11-14 DIAGNOSIS — Z85828 Personal history of other malignant neoplasm of skin: Secondary | ICD-10-CM | POA: Diagnosis not present

## 2014-11-14 DIAGNOSIS — D225 Melanocytic nevi of trunk: Secondary | ICD-10-CM | POA: Diagnosis not present

## 2014-11-14 DIAGNOSIS — L570 Actinic keratosis: Secondary | ICD-10-CM | POA: Diagnosis not present

## 2015-03-06 DIAGNOSIS — S12112G Nondisplaced Type II dens fracture, subsequent encounter for fracture with delayed healing: Secondary | ICD-10-CM | POA: Diagnosis not present

## 2015-03-06 DIAGNOSIS — R030 Elevated blood-pressure reading, without diagnosis of hypertension: Secondary | ICD-10-CM | POA: Diagnosis not present

## 2015-03-06 DIAGNOSIS — Z6825 Body mass index (BMI) 25.0-25.9, adult: Secondary | ICD-10-CM | POA: Diagnosis not present

## 2015-04-17 DIAGNOSIS — R946 Abnormal results of thyroid function studies: Secondary | ICD-10-CM | POA: Diagnosis not present

## 2015-04-17 DIAGNOSIS — I129 Hypertensive chronic kidney disease with stage 1 through stage 4 chronic kidney disease, or unspecified chronic kidney disease: Secondary | ICD-10-CM | POA: Diagnosis not present

## 2015-04-17 DIAGNOSIS — E785 Hyperlipidemia, unspecified: Secondary | ICD-10-CM | POA: Diagnosis not present

## 2015-04-17 DIAGNOSIS — N2581 Secondary hyperparathyroidism of renal origin: Secondary | ICD-10-CM | POA: Diagnosis not present

## 2015-04-17 DIAGNOSIS — M109 Gout, unspecified: Secondary | ICD-10-CM | POA: Diagnosis not present

## 2015-04-17 DIAGNOSIS — D631 Anemia in chronic kidney disease: Secondary | ICD-10-CM | POA: Diagnosis not present

## 2015-04-17 DIAGNOSIS — N184 Chronic kidney disease, stage 4 (severe): Secondary | ICD-10-CM | POA: Diagnosis not present

## 2015-04-17 DIAGNOSIS — N189 Chronic kidney disease, unspecified: Secondary | ICD-10-CM | POA: Diagnosis not present

## 2015-04-17 DIAGNOSIS — I779 Disorder of arteries and arterioles, unspecified: Secondary | ICD-10-CM | POA: Diagnosis not present

## 2015-04-17 DIAGNOSIS — C61 Malignant neoplasm of prostate: Secondary | ICD-10-CM | POA: Diagnosis not present

## 2015-06-21 DIAGNOSIS — M109 Gout, unspecified: Secondary | ICD-10-CM | POA: Diagnosis not present

## 2015-06-21 DIAGNOSIS — R946 Abnormal results of thyroid function studies: Secondary | ICD-10-CM | POA: Diagnosis not present

## 2015-06-21 DIAGNOSIS — R7301 Impaired fasting glucose: Secondary | ICD-10-CM | POA: Diagnosis not present

## 2015-06-21 DIAGNOSIS — E784 Other hyperlipidemia: Secondary | ICD-10-CM | POA: Diagnosis not present

## 2015-06-21 DIAGNOSIS — N184 Chronic kidney disease, stage 4 (severe): Secondary | ICD-10-CM | POA: Diagnosis not present

## 2015-06-25 DIAGNOSIS — Z0000 Encounter for general adult medical examination without abnormal findings: Secondary | ICD-10-CM | POA: Diagnosis not present

## 2015-06-25 DIAGNOSIS — C61 Malignant neoplasm of prostate: Secondary | ICD-10-CM | POA: Diagnosis not present

## 2015-06-25 DIAGNOSIS — I779 Disorder of arteries and arterioles, unspecified: Secondary | ICD-10-CM | POA: Diagnosis not present

## 2015-06-25 DIAGNOSIS — M109 Gout, unspecified: Secondary | ICD-10-CM | POA: Diagnosis not present

## 2015-06-25 DIAGNOSIS — D631 Anemia in chronic kidney disease: Secondary | ICD-10-CM | POA: Diagnosis not present

## 2015-06-25 DIAGNOSIS — I129 Hypertensive chronic kidney disease with stage 1 through stage 4 chronic kidney disease, or unspecified chronic kidney disease: Secondary | ICD-10-CM | POA: Diagnosis not present

## 2015-06-25 DIAGNOSIS — N2581 Secondary hyperparathyroidism of renal origin: Secondary | ICD-10-CM | POA: Diagnosis not present

## 2015-06-25 DIAGNOSIS — N184 Chronic kidney disease, stage 4 (severe): Secondary | ICD-10-CM | POA: Diagnosis not present

## 2015-06-25 DIAGNOSIS — E785 Hyperlipidemia, unspecified: Secondary | ICD-10-CM | POA: Diagnosis not present

## 2015-06-28 DIAGNOSIS — N184 Chronic kidney disease, stage 4 (severe): Secondary | ICD-10-CM | POA: Diagnosis not present

## 2015-06-28 DIAGNOSIS — I7389 Other specified peripheral vascular diseases: Secondary | ICD-10-CM | POA: Diagnosis not present

## 2015-06-28 DIAGNOSIS — Z6824 Body mass index (BMI) 24.0-24.9, adult: Secondary | ICD-10-CM | POA: Diagnosis not present

## 2015-06-28 DIAGNOSIS — K625 Hemorrhage of anus and rectum: Secondary | ICD-10-CM | POA: Diagnosis not present

## 2015-06-28 DIAGNOSIS — M109 Gout, unspecified: Secondary | ICD-10-CM | POA: Diagnosis not present

## 2015-06-28 DIAGNOSIS — E784 Other hyperlipidemia: Secondary | ICD-10-CM | POA: Diagnosis not present

## 2015-06-28 DIAGNOSIS — Z1389 Encounter for screening for other disorder: Secondary | ICD-10-CM | POA: Diagnosis not present

## 2015-06-28 DIAGNOSIS — Z0000 Encounter for general adult medical examination without abnormal findings: Secondary | ICD-10-CM | POA: Diagnosis not present

## 2015-06-28 DIAGNOSIS — I10 Essential (primary) hypertension: Secondary | ICD-10-CM | POA: Diagnosis not present

## 2015-06-28 DIAGNOSIS — R011 Cardiac murmur, unspecified: Secondary | ICD-10-CM | POA: Diagnosis not present

## 2015-06-28 DIAGNOSIS — C61 Malignant neoplasm of prostate: Secondary | ICD-10-CM | POA: Diagnosis not present

## 2015-06-28 DIAGNOSIS — Z95828 Presence of other vascular implants and grafts: Secondary | ICD-10-CM | POA: Diagnosis not present

## 2015-07-09 DIAGNOSIS — Z1212 Encounter for screening for malignant neoplasm of rectum: Secondary | ICD-10-CM | POA: Diagnosis not present

## 2015-09-04 DIAGNOSIS — L570 Actinic keratosis: Secondary | ICD-10-CM | POA: Diagnosis not present

## 2015-09-04 DIAGNOSIS — Z85828 Personal history of other malignant neoplasm of skin: Secondary | ICD-10-CM | POA: Diagnosis not present

## 2015-09-04 DIAGNOSIS — D1801 Hemangioma of skin and subcutaneous tissue: Secondary | ICD-10-CM | POA: Diagnosis not present

## 2015-09-04 DIAGNOSIS — L603 Nail dystrophy: Secondary | ICD-10-CM | POA: Diagnosis not present

## 2015-10-04 DIAGNOSIS — Z85828 Personal history of other malignant neoplasm of skin: Secondary | ICD-10-CM | POA: Diagnosis not present

## 2015-10-04 DIAGNOSIS — L570 Actinic keratosis: Secondary | ICD-10-CM | POA: Diagnosis not present

## 2015-11-06 DIAGNOSIS — M109 Gout, unspecified: Secondary | ICD-10-CM | POA: Diagnosis not present

## 2015-11-06 DIAGNOSIS — I129 Hypertensive chronic kidney disease with stage 1 through stage 4 chronic kidney disease, or unspecified chronic kidney disease: Secondary | ICD-10-CM | POA: Diagnosis not present

## 2015-11-06 DIAGNOSIS — N184 Chronic kidney disease, stage 4 (severe): Secondary | ICD-10-CM | POA: Diagnosis not present

## 2015-11-06 DIAGNOSIS — N2581 Secondary hyperparathyroidism of renal origin: Secondary | ICD-10-CM | POA: Diagnosis not present

## 2015-11-06 DIAGNOSIS — I779 Disorder of arteries and arterioles, unspecified: Secondary | ICD-10-CM | POA: Diagnosis not present

## 2015-11-06 DIAGNOSIS — C61 Malignant neoplasm of prostate: Secondary | ICD-10-CM | POA: Diagnosis not present

## 2015-11-06 DIAGNOSIS — E785 Hyperlipidemia, unspecified: Secondary | ICD-10-CM | POA: Diagnosis not present

## 2015-11-06 DIAGNOSIS — R946 Abnormal results of thyroid function studies: Secondary | ICD-10-CM | POA: Diagnosis not present

## 2015-11-06 DIAGNOSIS — D631 Anemia in chronic kidney disease: Secondary | ICD-10-CM | POA: Diagnosis not present

## 2015-12-31 DIAGNOSIS — R05 Cough: Secondary | ICD-10-CM | POA: Diagnosis not present

## 2015-12-31 DIAGNOSIS — Z6823 Body mass index (BMI) 23.0-23.9, adult: Secondary | ICD-10-CM | POA: Diagnosis not present

## 2015-12-31 DIAGNOSIS — H68002 Unspecified Eustachian salpingitis, left ear: Secondary | ICD-10-CM | POA: Diagnosis not present

## 2015-12-31 DIAGNOSIS — J302 Other seasonal allergic rhinitis: Secondary | ICD-10-CM | POA: Diagnosis not present

## 2016-01-22 DIAGNOSIS — Z23 Encounter for immunization: Secondary | ICD-10-CM | POA: Diagnosis not present

## 2016-03-03 ENCOUNTER — Other Ambulatory Visit (INDEPENDENT_AMBULATORY_CARE_PROVIDER_SITE_OTHER): Payer: Self-pay | Admitting: Orthopaedic Surgery
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
## 2016-03-04 ENCOUNTER — Encounter (INDEPENDENT_AMBULATORY_CARE_PROVIDER_SITE_OTHER): Payer: Self-pay

## 2016-03-05 ENCOUNTER — Telehealth (INDEPENDENT_AMBULATORY_CARE_PROVIDER_SITE_OTHER): Payer: Self-pay | Admitting: *Deleted

## 2016-03-05 NOTE — Telephone Encounter (Signed)
Pt stated  Dr. Erlinda Hong sent email to pt wife stating he would fax letter over, she stated it wasn't received. Can be sent to email [email or web address] or fax: [phone]. If there is no record of this letter she stated Dr. Erlinda Hong would know

## 2016-03-05 NOTE — Telephone Encounter (Signed)
Mailed letter yesterday also faxed letter today just now.

## 2016-03-05 NOTE — Telephone Encounter (Signed)
Timothy [name] pts wife aware

## 2016-03-20 DIAGNOSIS — C61 Malignant neoplasm of prostate: Secondary | ICD-10-CM | POA: Diagnosis not present

## 2016-03-20 DIAGNOSIS — N189 Chronic kidney disease, unspecified: Secondary | ICD-10-CM | POA: Diagnosis not present

## 2016-03-20 DIAGNOSIS — E785 Hyperlipidemia, unspecified: Secondary | ICD-10-CM | POA: Diagnosis not present

## 2016-03-20 DIAGNOSIS — I129 Hypertensive chronic kidney disease with stage 1 through stage 4 chronic kidney disease, or unspecified chronic kidney disease: Secondary | ICD-10-CM | POA: Diagnosis not present

## 2016-03-20 DIAGNOSIS — I779 Disorder of arteries and arterioles, unspecified: Secondary | ICD-10-CM | POA: Diagnosis not present

## 2016-03-20 DIAGNOSIS — N2581 Secondary hyperparathyroidism of renal origin: Secondary | ICD-10-CM | POA: Diagnosis not present

## 2016-03-20 DIAGNOSIS — M109 Gout, unspecified: Secondary | ICD-10-CM | POA: Diagnosis not present

## 2016-03-20 DIAGNOSIS — D631 Anemia in chronic kidney disease: Secondary | ICD-10-CM | POA: Diagnosis not present

## 2016-03-20 DIAGNOSIS — N184 Chronic kidney disease, stage 4 (severe): Secondary | ICD-10-CM | POA: Diagnosis not present

## 2016-03-20 DIAGNOSIS — R946 Abnormal results of thyroid function studies: Secondary | ICD-10-CM | POA: Diagnosis not present

## 2016-04-28 DIAGNOSIS — L570 Actinic keratosis: Secondary | ICD-10-CM | POA: Diagnosis not present

## 2016-04-28 DIAGNOSIS — Z85828 Personal history of other malignant neoplasm of skin: Secondary | ICD-10-CM | POA: Diagnosis not present

## 2016-04-28 DIAGNOSIS — D692 Other nonthrombocytopenic purpura: Secondary | ICD-10-CM | POA: Diagnosis not present

## 2016-04-28 DIAGNOSIS — L821 Other seborrheic keratosis: Secondary | ICD-10-CM | POA: Diagnosis not present

## 2016-04-28 DIAGNOSIS — L820 Inflamed seborrheic keratosis: Secondary | ICD-10-CM | POA: Diagnosis not present

## 2016-04-28 DIAGNOSIS — D225 Melanocytic nevi of trunk: Secondary | ICD-10-CM | POA: Diagnosis not present

## 2016-07-14 DIAGNOSIS — N184 Chronic kidney disease, stage 4 (severe): Secondary | ICD-10-CM | POA: Diagnosis not present

## 2016-07-14 DIAGNOSIS — M109 Gout, unspecified: Secondary | ICD-10-CM | POA: Diagnosis not present

## 2016-07-14 DIAGNOSIS — I779 Disorder of arteries and arterioles, unspecified: Secondary | ICD-10-CM | POA: Diagnosis not present

## 2016-07-14 DIAGNOSIS — D631 Anemia in chronic kidney disease: Secondary | ICD-10-CM | POA: Diagnosis not present

## 2016-07-14 DIAGNOSIS — N2581 Secondary hyperparathyroidism of renal origin: Secondary | ICD-10-CM | POA: Diagnosis not present

## 2016-07-14 DIAGNOSIS — I129 Hypertensive chronic kidney disease with stage 1 through stage 4 chronic kidney disease, or unspecified chronic kidney disease: Secondary | ICD-10-CM | POA: Diagnosis not present

## 2016-07-14 DIAGNOSIS — C61 Malignant neoplasm of prostate: Secondary | ICD-10-CM | POA: Diagnosis not present

## 2016-07-14 DIAGNOSIS — E785 Hyperlipidemia, unspecified: Secondary | ICD-10-CM | POA: Diagnosis not present

## 2016-07-17 DIAGNOSIS — N184 Chronic kidney disease, stage 4 (severe): Secondary | ICD-10-CM | POA: Diagnosis not present

## 2016-07-17 DIAGNOSIS — R8299 Other abnormal findings in urine: Secondary | ICD-10-CM | POA: Diagnosis not present

## 2016-07-17 DIAGNOSIS — R7301 Impaired fasting glucose: Secondary | ICD-10-CM | POA: Diagnosis not present

## 2016-07-17 DIAGNOSIS — M109 Gout, unspecified: Secondary | ICD-10-CM | POA: Diagnosis not present

## 2016-07-17 DIAGNOSIS — E784 Other hyperlipidemia: Secondary | ICD-10-CM | POA: Diagnosis not present

## 2016-07-24 DIAGNOSIS — Z95828 Presence of other vascular implants and grafts: Secondary | ICD-10-CM | POA: Diagnosis not present

## 2016-07-24 DIAGNOSIS — Z6824 Body mass index (BMI) 24.0-24.9, adult: Secondary | ICD-10-CM | POA: Diagnosis not present

## 2016-07-24 DIAGNOSIS — N184 Chronic kidney disease, stage 4 (severe): Secondary | ICD-10-CM | POA: Diagnosis not present

## 2016-07-24 DIAGNOSIS — I493 Ventricular premature depolarization: Secondary | ICD-10-CM | POA: Diagnosis not present

## 2016-07-24 DIAGNOSIS — J302 Other seasonal allergic rhinitis: Secondary | ICD-10-CM | POA: Diagnosis not present

## 2016-07-24 DIAGNOSIS — E784 Other hyperlipidemia: Secondary | ICD-10-CM | POA: Diagnosis not present

## 2016-07-24 DIAGNOSIS — C61 Malignant neoplasm of prostate: Secondary | ICD-10-CM | POA: Diagnosis not present

## 2016-07-24 DIAGNOSIS — R05 Cough: Secondary | ICD-10-CM | POA: Diagnosis not present

## 2016-07-24 DIAGNOSIS — R011 Cardiac murmur, unspecified: Secondary | ICD-10-CM | POA: Diagnosis not present

## 2016-07-24 DIAGNOSIS — Z125 Encounter for screening for malignant neoplasm of prostate: Secondary | ICD-10-CM | POA: Diagnosis not present

## 2016-07-24 DIAGNOSIS — Z1389 Encounter for screening for other disorder: Secondary | ICD-10-CM | POA: Diagnosis not present

## 2016-07-24 DIAGNOSIS — Z0000 Encounter for general adult medical examination without abnormal findings: Secondary | ICD-10-CM | POA: Diagnosis not present

## 2016-07-30 DIAGNOSIS — Z1212 Encounter for screening for malignant neoplasm of rectum: Secondary | ICD-10-CM | POA: Diagnosis not present

## 2016-09-12 DIAGNOSIS — E784 Other hyperlipidemia: Secondary | ICD-10-CM | POA: Diagnosis not present

## 2016-09-12 DIAGNOSIS — N184 Chronic kidney disease, stage 4 (severe): Secondary | ICD-10-CM | POA: Diagnosis not present

## 2016-09-12 DIAGNOSIS — I493 Ventricular premature depolarization: Secondary | ICD-10-CM | POA: Diagnosis not present

## 2016-09-12 DIAGNOSIS — Z6823 Body mass index (BMI) 23.0-23.9, adult: Secondary | ICD-10-CM | POA: Diagnosis not present

## 2016-09-15 ENCOUNTER — Other Ambulatory Visit: Payer: Self-pay | Admitting: Internal Medicine

## 2016-09-15 DIAGNOSIS — I493 Ventricular premature depolarization: Secondary | ICD-10-CM

## 2016-09-16 ENCOUNTER — Ambulatory Visit: Payer: Medicare Other | Admitting: Cardiology

## 2016-09-22 ENCOUNTER — Other Ambulatory Visit (HOSPITAL_COMMUNITY): Payer: Medicare Other

## 2016-10-28 ENCOUNTER — Ambulatory Visit (INDEPENDENT_AMBULATORY_CARE_PROVIDER_SITE_OTHER): Payer: Medicare Other | Admitting: Cardiovascular Disease

## 2016-10-28 ENCOUNTER — Encounter: Payer: Self-pay | Admitting: Cardiovascular Disease

## 2016-10-28 ENCOUNTER — Other Ambulatory Visit: Payer: Self-pay

## 2016-10-28 ENCOUNTER — Ambulatory Visit (INDEPENDENT_AMBULATORY_CARE_PROVIDER_SITE_OTHER): Payer: Medicare Other

## 2016-10-28 ENCOUNTER — Ambulatory Visit (HOSPITAL_COMMUNITY): Payer: Medicare Other | Attending: Cardiology

## 2016-10-28 VITALS — BP 130/52 | HR 46 | Ht 69.0 in | Wt 167.4 lb

## 2016-10-28 DIAGNOSIS — I071 Rheumatic tricuspid insufficiency: Secondary | ICD-10-CM | POA: Insufficient documentation

## 2016-10-28 DIAGNOSIS — G459 Transient cerebral ischemic attack, unspecified: Secondary | ICD-10-CM

## 2016-10-28 DIAGNOSIS — I493 Ventricular premature depolarization: Secondary | ICD-10-CM

## 2016-10-28 DIAGNOSIS — E782 Mixed hyperlipidemia: Secondary | ICD-10-CM

## 2016-10-28 DIAGNOSIS — I10 Essential (primary) hypertension: Secondary | ICD-10-CM

## 2016-10-28 DIAGNOSIS — I119 Hypertensive heart disease without heart failure: Secondary | ICD-10-CM | POA: Diagnosis not present

## 2016-10-28 DIAGNOSIS — E785 Hyperlipidemia, unspecified: Secondary | ICD-10-CM | POA: Diagnosis not present

## 2016-10-28 DIAGNOSIS — I739 Peripheral vascular disease, unspecified: Secondary | ICD-10-CM | POA: Diagnosis not present

## 2016-10-28 DIAGNOSIS — R002 Palpitations: Secondary | ICD-10-CM

## 2016-10-28 NOTE — Patient Instructions (Signed)
Medication Instructions:  Your physician recommends that you continue on your current medications as directed. Please refer to the Current Medication list given to you today.   Labwork: None Ordered   Testing/Procedures: Your physician has recommended that you wear an event monitor. Event monitors are medical devices that record the heart's electrical activity. Doctors most often [place] these monitors to diagnose arrhythmias. Arrhythmias are problems with the speed or rhythm of the heartbeat. The monitor is a small, portable device. You can wear one while you do your normal daily activities. This is usually used to diagnose what is causing palpitations/syncope (passing out).   Follow-Up: Your physician recommends that you schedule a follow-up appointment in: 3 months with Dr. Acie Fredrickson   If you need a refill on your cardiac medications before your next appointment, please call your pharmacy.   Thank you for choosing [facility]! Christen Bame, RN [phone]

## 2016-10-28 NOTE — Progress Notes (Signed)
Cardiology Office Note:    Date:  10/28/2016   ID:  Mel, Langan Apr 07, 1940, MRN [id number]  PCP:  Crist Infante, MD  Cardiologist:  Mertie Moores, MD    Referring MD: Crist Infante, MD   Problem list 1. Palpitations 2. History of premature ventricular contractions 3.  HTN 4. Hyperlipidemia 5. Carotid artery disease-status post stenting around 2004 6.  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 ( Creatinine = 2.0 July 2018)  7.  Hx of TIA - 10-12 years ago   Chief Complaint  Patient presents with  . New Patient (Initial Visit)    palpitations    History of Present Illness:    Timothy [name] is a 76 y.o. male with a hx of PVCs and PACs. Presents now with increase palpitations.  Has been diagnosied with PVCs and PACs in the past   Now, these palpitations are more pronounced.   Has a hx of TIAs in the past  Has classical migraine HA The palpitations last for a few seconds.   Not very severe ,  Is active, does not do any regular organized exercise.  Is able to do his chores and yard work without any issues  Has been on Atenolol since age 56 for palpitations   Past Medical History:  Diagnosis Date  . Anal fissure    ?  . Bright red rectal bleeding   . C2 cervical fracture ([place]) 06/24/2014  . Diverticulosis   . GERD (gastroesophageal reflux disease)   . H/O prostate cancer 06/05/2011   S/p radiation rx 2010   . Hemorrhage of rectum and anus 05/09/2011  . HLD (hyperlipidemia)   . HTN (hypertension)   . Kidney disease    acute glomeral nepritis  . Peripheral vascular disease ([place])    carotid artery stent ( right  )  . Prostate cancer ([place])   . 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 CAROTID STENT    . external beam radiation to prostate  2009  . FLEXIBLE SIGMOIDOSCOPY  05/22/2011   Procedure: FLEXIBLE SIGMOIDOSCOPY;  Surgeon: [name], MD;  Location: [facility];  Service: Endoscopy;  Laterality: N/A;    . HOT HEMOSTASIS  05/22/2011   Procedure: HOT HEMOSTASIS (ARGON PLASMA COAGULATION/BICAP);  Surgeon: [name], MD;  Location: [facility];  Service: Endoscopy;  Laterality: N/A;    Current Medications: Current Meds  Medication Sig  . allopurinol (ZYLOPRIM) 300 MG tablet Take 300 mg by mouth daily.   Marland Kitchen amLODipine (NORVASC) 10 MG tablet Take 10 mg by mouth daily.  Marland Kitchen aspirin 81 MG tablet Take 81 mg by mouth daily.  Marland Kitchen atenolol (TENORMIN) 100 MG tablet Take 100 mg by mouth daily.   . furosemide (LASIX) 40 MG tablet Take 40 mg by mouth daily.  Marland Kitchen omeprazole (PRILOSEC) 20 MG capsule Take 20 mg by mouth daily.  . rosuvastatin (CRESTOR) 10 MG tablet Take 10 mg by mouth daily.  Marland Kitchen telmisartan (MICARDIS) 80 MG tablet Take 80 mg by mouth daily.     Allergies:   Patient has no known allergies.   Social History   Social History  . Marital status: Married    Spouse name: N/A  . Number of children: 1  . Years of education: N/A   Occupational History  . pharmacist    Social History Main Topics  . Smoking status: Former Smoker    Types: Cigarettes  . Smokeless tobacco:  Never Used  . Alcohol use Yes     Comment: occas  . Drug use: No  . Sexual activity: Not Asked   Other Topics Concern  . None   Social History Narrative  . None     Family History: The patient's family history includes Atrial fibrillation in his brother; Brain cancer in his brother. There is no history of Colon cancer. ROS:   Please see the history of present illness.     All other systems reviewed and are negative.  EKGs/Labs/Other Studies Reviewed:    The following studies were reviewed today: Records and labs from Dr. Joylene Draft    EKG:  EKG is  ordered today.  The ekg ordered today demonstrates sinus brady   Recent Labs: No results found for requested labs within last 8760 hours.  Recent Lipid Panel No results found for: CHOL, TRIG, HDL, CHOLHDL, VLDL, LDLCALC, LDLDIRECT  Physical Exam:    VS:   BP (!) 130/52   Pulse (!) 46   Ht 5\' 9"  (1.753 m)   Wt 167 lb 6.4 oz (75.9 kg)   BMI 24.72 kg/m     Wt Readings from Last 3 Encounters:  10/28/16 167 lb 6.4 oz (75.9 kg)  06/26/14 168 lb 11.2 oz (76.5 kg)  01/18/13 170 lb 1.6 oz (77.2 kg)     GEN:  Well nourished, well developed in no acute distress HEENT: Normal NECK: No JVD; No carotid bruits LYMPHATICS: No lymphadenopathy CARDIAC: RR, no murmurs, rubs, gallops RESPIRATORY:  Clear to auscultation without rales, wheezing or rhonchi  ABDOMEN: Soft, non-tender, non-distended MUSCULOSKELETAL:  No edema; No deformity  SKIN: Warm and dry NEUROLOGIC:  Alert and oriented x 3 PSYCHIATRIC:  Normal affect   ASSESSMENT:    No diagnosis found. PLAN:    In order of problems listed above:  1. Palpitations: Timothy [name] presents today for further evaluation of his palpitations.  He has known premature atrial contractions and premature ventricular contractions in the past. He's had a history of TIA which makes evaluation for possible atrial fibrillation very important.  He's had an echocardiogram this morning. We will continue the current dose of atenolol. We'll place a 30 day event monitor.  I'll see him again in 3 months for follow-up visit. He'll call us sooner if he has any additional problems.  2. Essential hypertension: He has significant chronic kidney disease. He has been followed by Dr. Jimmy Footman for his HTN    Medication Adjustments/Labs and Tests Ordered: Current medicines are reviewed at length with the patient today.  Concerns regarding medicines are outlined above.  No orders of the defined types were placed in this encounter.  No orders of the defined types were placed in this encounter.   Signed, Mertie Moores, MD  10/28/2016 10:34 AM    [facility]

## 2016-11-01 ENCOUNTER — Telehealth: Payer: Self-pay | Admitting: Cardiology

## 2016-11-01 NOTE — Telephone Encounter (Signed)
Called by monitoring company pt had short burst of a flutter, HR 40-60 had 3.1 sec pause and converted back to SR pt was asleep.  Will send to Dr. Acie Fredrickson to review strips and possible begin anticoagulation.

## 2016-11-03 ENCOUNTER — Telehealth: Payer: Self-pay | Admitting: *Deleted

## 2016-11-03 ENCOUNTER — Encounter: Payer: Self-pay | Admitting: Cardiovascular Disease

## 2016-11-03 MED ORDER — APIXABAN 5 MG PO TABS: 5.0000 mg | ORAL_TABLET | Freq: Two times a day (BID) | ORAL | 6 refills | Status: DC

## 2016-11-03 NOTE — Progress Notes (Signed)
Event monitor shows PAF with post conversion pauses.  The Atrial fib is new  Start Eliquis 5 mg PO BID   Continue to monitor

## 2016-11-03 NOTE — Telephone Encounter (Signed)
I have personally reviewed the ECG .   He has evidence of paroxysmal atrial fib / flutter . This has been discussed with the patient .

## 2016-11-03 NOTE — Telephone Encounter (Signed)
Discussed with Dr Acie Fredrickson  Re abnormal recordings that was sent via [employer]  recordings show new afib /aflutter have pt stop Asa and start Eliquis 5 mg bid Pt notified of  recommendations and agrees.While giving results pt has concerns re  Monitor malfunctioning frequently and if these readings are true wants to make sure Dr Acie Fredrickson is aware .Adonis Housekeeper

## 2016-11-04 ENCOUNTER — Telehealth: Payer: Self-pay | Admitting: Cardiovascular Disease

## 2016-11-04 NOTE — Telephone Encounter (Signed)
**Note De-Identified  Obfuscation** The pts wife Timothy [name] is advised that I am leaving her a free 30 day trail Eliquis card in our front office for her or the pt to pick up at their convenience. Also they will bring the pts insurance cards to the office at that time to be scanned into the pts chart so I can do a tier exception on the pts Eliquis.

## 2016-11-04 NOTE — Telephone Encounter (Signed)
I have done a tier exception on Eliquis through covermymeds. It is unlikely that this tier exception will be approved as the pt has never tried and failed any other anticoagulant.

## 2016-11-04 NOTE — Telephone Encounter (Signed)
Spoke with patient and reviewed that Dr. Acie Fredrickson has personally reviewed the monitor and we have definite evidence of atrial fib/flutter. Patient verbalized understanding and is aware of need for eliquis. He will continue to wear monitor until October 1 and is aware we may call back with additional advice prior to next appointment. I rescheduled his follow-up appointment from December to October 5. He thanked me for the call.

## 2016-11-04 NOTE — Telephone Encounter (Signed)
New message      Pt was recently prescribed eliquis.  It was 400.00.  Patient paid for it but was told we had a card for 80mo free medication.  Patient want to know if we have that card because they want to see if they can be reimbursed.  Also, calling to get assistance in paying for medication.  Please call today and let pt know if we have that card for free medication

## 2016-11-06 ENCOUNTER — Telehealth: Payer: Self-pay | Admitting: Cardiovascular Disease

## 2016-11-06 ENCOUNTER — Telehealth: Payer: Self-pay | Admitting: *Deleted

## 2016-11-06 MED ORDER — ATENOLOL 100 MG PO TABS: 50.0000 mg | ORAL_TABLET | Freq: Every day | ORAL | 3 refills | Status: DC

## 2016-11-06 NOTE — Telephone Encounter (Signed)
New Message  Lelan Pons from [facility] call to repost an abnormal EKG. Please call back to discuss

## 2016-11-06 NOTE — Telephone Encounter (Signed)
Received fax from [facility] stating they have approved a tier 1 coverage for patients ELIQUIS. I have called and left patient a message regarding this information.

## 2016-11-06 NOTE — Telephone Encounter (Signed)
Received [employer] monitor report from 4:43pm showing 3.1 sec pause, sinus bradycardia, PVCs. Pt reports sitting at home experiencing palpitations and slight dizziness. Feels much better now. Reviewed w/ Dr. Acie Fredrickson - order to decrease Atenolol to 50 mg QD. Patient verbalized understanding and agreeable to plan.

## 2016-11-07 ENCOUNTER — Telehealth: Payer: Self-pay | Admitting: Cardiovascular Disease

## 2016-11-07 NOTE — Telephone Encounter (Signed)
New message    Pt wife is calling about pt. She said he is having severe contact dermatitis due to the heart monitor patches. She said they took it off last night just so he could sleep. She said that they contacted the company about it last night.

## 2016-11-07 NOTE — Telephone Encounter (Signed)
Pt states his monitor was placed 9/4 and had to be placed because it malfunctioned.  He recently put the new one on and last night developed a red, rashy, raised, itchy rash.  He reports it looks horrible today.  He removed the patch about 2:30 am.  Advised OK to use topical creams like Benadryl or calamine.  He may also use cool compresses for relief.  He will contact the company to see if they had patches for people with sensitive skin.  He will c/b will further questions or concerns.  He and his wife were very grateful for the promptness in my response and for the advise/information.

## 2016-11-14 DIAGNOSIS — Z23 Encounter for immunization: Secondary | ICD-10-CM | POA: Diagnosis not present

## 2016-11-14 DIAGNOSIS — I779 Disorder of arteries and arterioles, unspecified: Secondary | ICD-10-CM | POA: Diagnosis not present

## 2016-11-14 DIAGNOSIS — C61 Malignant neoplasm of prostate: Secondary | ICD-10-CM | POA: Diagnosis not present

## 2016-11-14 DIAGNOSIS — N184 Chronic kidney disease, stage 4 (severe): Secondary | ICD-10-CM | POA: Diagnosis not present

## 2016-11-14 DIAGNOSIS — I129 Hypertensive chronic kidney disease with stage 1 through stage 4 chronic kidney disease, or unspecified chronic kidney disease: Secondary | ICD-10-CM | POA: Diagnosis not present

## 2016-11-14 DIAGNOSIS — E785 Hyperlipidemia, unspecified: Secondary | ICD-10-CM | POA: Diagnosis not present

## 2016-11-14 DIAGNOSIS — R946 Abnormal results of thyroid function studies: Secondary | ICD-10-CM | POA: Diagnosis not present

## 2016-11-14 DIAGNOSIS — D631 Anemia in chronic kidney disease: Secondary | ICD-10-CM | POA: Diagnosis not present

## 2016-11-14 DIAGNOSIS — N2581 Secondary hyperparathyroidism of renal origin: Secondary | ICD-10-CM | POA: Diagnosis not present

## 2016-11-14 DIAGNOSIS — M109 Gout, unspecified: Secondary | ICD-10-CM | POA: Diagnosis not present

## 2016-11-17 ENCOUNTER — Telehealth: Payer: Self-pay | Admitting: *Deleted

## 2016-11-17 MED ORDER — ATENOLOL 25 MG PO TABS: 25.0000 mg | ORAL_TABLET | Freq: Every day | ORAL | 1 refills | Status: DC

## 2016-11-17 NOTE — Telephone Encounter (Signed)
Received reports from [employer] 1:14 am 3 sec pause on 11/16/16, 3.7 sec 11/16/16 at 11:09 pm, 11/17/16 atrial flutter, 3.7 sec at 12:58 am. Patient was asleep and asymptomatic with events. Reviewed by Dr Acie Fredrickson and will decrease Atenolol to 25 mg daily continue to monitor  Advised patient, verbalized understanding

## 2016-11-18 MED ORDER — ATENOLOL 25 MG PO TABS: 25.0000 mg | ORAL_TABLET | Freq: Every day | ORAL | 3 refills | Status: DC

## 2016-11-18 NOTE — Telephone Encounter (Signed)
Pt medication was resent to pt's preferred pharmacy, [facility] mail order pharmacy. Confirmation received.

## 2016-11-18 NOTE — Addendum Note (Signed)
Addended by: Derl Barrow on: 11/18/2016 09:57 AM   Modules accepted: Orders

## 2016-11-19 ENCOUNTER — Encounter: Payer: Self-pay | Admitting: Cardiovascular Disease

## 2016-11-19 DIAGNOSIS — H2513 Age-related nuclear cataract, bilateral: Secondary | ICD-10-CM | POA: Diagnosis not present

## 2016-11-19 DIAGNOSIS — H524 Presbyopia: Secondary | ICD-10-CM | POA: Diagnosis not present

## 2016-11-19 DIAGNOSIS — H43813 Vitreous degeneration, bilateral: Secondary | ICD-10-CM | POA: Diagnosis not present

## 2016-11-19 DIAGNOSIS — H01001 Unspecified blepharitis right upper eyelid: Secondary | ICD-10-CM | POA: Diagnosis not present

## 2016-11-24 DIAGNOSIS — R002 Palpitations: Secondary | ICD-10-CM | POA: Diagnosis not present

## 2016-11-24 DIAGNOSIS — G459 Transient cerebral ischemic attack, unspecified: Secondary | ICD-10-CM

## 2016-11-28 ENCOUNTER — Telehealth: Payer: Self-pay | Admitting: Nurse Practitioner

## 2016-11-28 ENCOUNTER — Encounter: Payer: Self-pay | Admitting: Cardiovascular Disease

## 2016-11-28 ENCOUNTER — Ambulatory Visit (INDEPENDENT_AMBULATORY_CARE_PROVIDER_SITE_OTHER): Payer: Medicare Other | Admitting: Cardiovascular Disease

## 2016-11-28 VITALS — BP 142/64 | HR 51 | Ht 69.0 in | Wt 172.0 lb

## 2016-11-28 DIAGNOSIS — R55 Syncope and collapse: Secondary | ICD-10-CM

## 2016-11-28 DIAGNOSIS — I480 Paroxysmal atrial fibrillation: Secondary | ICD-10-CM | POA: Diagnosis not present

## 2016-11-28 MED ORDER — APIXABAN 5 MG PO TABS: 5.0000 mg | ORAL_TABLET | Freq: Two times a day (BID) | ORAL | 3 refills | Status: DC

## 2016-11-28 NOTE — Telephone Encounter (Signed)
Reviewed Dr. Elmarie Shiley advice with patient who verbalized understanding and agreement to stop Atenolol. He is aware that our office will call him to schedule his appointment with EP. He thanked me for the call.

## 2016-11-28 NOTE — Progress Notes (Signed)
Cardiology Office Note:    Date:  11/28/2016   ID:  Mac, Dowdell Jan 01, 1941, MRN [id number]  PCP:  Crist Infante, MD  Cardiologist:  Mertie Moores, MD    Referring MD: Crist Infante, MD   Problem list 1. Paroxysmal Atrial fib - with post conversion pauses CHADS2VASC  =  5  ( age 76 , HTN, CAD, TIA  2. History of premature ventricular contractions 3.  HTN 4. Hyperlipidemia 5. Carotid artery disease-status post stenting around 2004 6.  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 ( Creatinine = 2.0 July 2018)  7.  Hx of TIA - 10-12 years ago   Chief Complaint  Patient presents with  . Atrial Fibrillation    Previous Notes - Sept. 2018    Timothy [name] is a 76 y.o. male with a hx of PVCs and PACs. Presents now with increase palpitations.  Has been diagnosied with PVCs and PACs in the past   Now, these palpitations are more pronounced.   Has a hx of TIAs in the past  Has classical migraine HA The palpitations last for a few seconds.   Not very severe ,  Is active, does not do any regular organized exercise.  Is able to do his chores and yard work without any issues  Has been on Atenolol since age 59 for palpitations   Oct. 5, 2018:  Timothy [name] is seen with wife, [name].  Still having some palppitations , last for seconds.  No dizziness, no CP or dyspnea.   30 day monitor showed atrial fib and also show some post conversion pauses.  We have gradually decreased his dose of Atenolol   Echo shows normal LV function with grade 1 diastolic dysfunction , mild TR with mild pulmonary HTN   Past Medical History:  Diagnosis Date  . Anal fissure    ?  . Bright red rectal bleeding   . C2 cervical fracture ([place]) 06/24/2014  . Diverticulosis   . GERD (gastroesophageal reflux disease)   . H/O prostate cancer 06/05/2011   S/p radiation rx 2010   . Hemorrhage of rectum and anus 05/09/2011  . HLD (hyperlipidemia)   . HTN (hypertension)   . Kidney disease    acute glomeral nepritis  .  Peripheral vascular disease ([place])    carotid artery stent ( right  )  . Prostate cancer ([place])   . 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 CAROTID STENT    . external beam radiation to prostate  2009  . FLEXIBLE SIGMOIDOSCOPY  05/22/2011   Procedure: FLEXIBLE SIGMOIDOSCOPY;  Surgeon: [name], MD;  Location: [facility];  Service: Endoscopy;  Laterality: N/A;  . HOT HEMOSTASIS  05/22/2011   Procedure: HOT HEMOSTASIS (ARGON PLASMA COAGULATION/BICAP);  Surgeon: [name], MD;  Location: [facility];  Service: Endoscopy;  Laterality: N/A;    Current Medications: Current Meds  Medication Sig  . allopurinol (ZYLOPRIM) 300 MG tablet Take 300 mg by mouth daily.   Marland Kitchen amLODipine (NORVASC) 10 MG tablet Take 10 mg by mouth daily.  Marland Kitchen apixaban (ELIQUIS) 5 MG TABS tablet Take 1 tablet (5 mg total) by mouth 2 (two) times daily.  Marland Kitchen atenolol (TENORMIN) 25 MG tablet Take 1 tablet (25 mg total) by mouth daily.  . furosemide (LASIX) 40 MG tablet Take 40 mg by mouth daily.  Marland Kitchen omeprazole (PRILOSEC) 20 MG capsule Take 20 mg by mouth daily.  Marland Kitchen  rosuvastatin (CRESTOR) 20 MG tablet   . telmisartan (MICARDIS) 80 MG tablet Take 80 mg by mouth daily.     Allergies:   Patient has no known allergies.   Social History   Social History  . Marital status: Married    Spouse name: N/A  . Number of children: 1  . Years of education: N/A   Occupational History  . pharmacist    Social History Main Topics  . Smoking status: Former Smoker    Types: Cigarettes  . Smokeless tobacco: Never Used  . Alcohol use Yes     Comment: occas  . Drug use: No  . Sexual activity: Not Asked   Other Topics Concern  . None   Social History Narrative  . None     Family History: The patient's family history includes Atrial fibrillation in his brother; Brain cancer in his brother. There is no history of Colon cancer. ROS:   Please see the history of  present illness.     All other systems reviewed and are negative.  EKGs/Labs/Other Studies Reviewed:    The following studies were reviewed today: Records and labs from Dr. Joylene Draft  EKG:  EKG is  ordered today.  The ekg ordered today demonstrates sinus brady   Recent Labs: No results found for requested labs within last 8760 hours.  Recent Lipid Panel No results found for: CHOL, TRIG, HDL, CHOLHDL, VLDL, LDLCALC, LDLDIRECT  Physical Exam: Blood pressure (!) 142/64, pulse (!) 51, height 5\' 9"  (1.753 m), weight 172 lb (78 kg), SpO2 98 %.  GEN:  Well nourished, well developed in no acute distress HEENT: Normal NECK: No JVD; No carotid bruits LYMPHATICS: No lymphadenopathy CARDIAC: RR with an episode of Irreg. Irreg.   no murmurs, rubs, gallops RESPIRATORY:  Clear to auscultation without rales, wheezing or rhonchi  ABDOMEN: Soft, non-tender, non-distended MUSCULOSKELETAL:  No edema; No deformity  SKIN: Warm and dry NEUROLOGIC:  Alert and oriented x 3    ASSESSMENT:    No diagnosis found. PLAN:    In order of problems listed above:  1.  Paroxysmal Atrial fib:   I was found have atrial fibrillation on event monitor. We started him on  Eliquis  at that time. Echo shows normal LV phone. He is basically asymptomatic during the episodes of atrial fibrillation and I do not think that he needs to be started on antiarrhythmic at this time.  2. Postconversion pauses. He had several documented postconversion pauses.   After much discussion, it became clear that his symptoms seem to be related to these postconversion pauses. He has episodes of "graying out". These episodes of gray out last for about 45 seconds. His episodes of atrial fibrillation seemed to be 30-45 seconds and he cannot feel them. He had an episode of atrial fibrillation here in the office and really did not have any symptoms during A. fib. He was lying down at the time and so he did not have any presyncope either.  Because  of these evidence of postconversion pauses, he has episodes of presyncope, and the difficulties that were going to have in starting any antiarrhythmic medications, I think that he'll need a pacemaker before we start any additional medications. We will refer him to electrophysiology.  I'll see him in the office in 3 months.   2. Essential hypertension:     Medication Adjustments/Labs and Tests Ordered: Current medicines are reviewed at length with the patient today.  Concerns regarding medicines are outlined above.  No orders of the defined types were placed in this encounter.  No orders of the defined types were placed in this encounter.   Signed, Mertie Moores, MD  11/28/2016 3:01 PM    [place]

## 2016-11-28 NOTE — Telephone Encounter (Signed)
-----   Message from Thayer Headings, MD sent at 11/28/2016  5:19 PM EDT ----- Significant pauses - both post conversion pauses and long pauses during episodes of atrial fib Have him stop the atenolol completely

## 2016-11-28 NOTE — Patient Instructions (Addendum)
Medication Instructions:  Your physician recommends that you continue on your current medications as directed. Please refer to the Current Medication list given to you today.   Labwork: None Ordered   Testing/Procedures: None Ordered   Follow-Up: You have been referred to EP Cardiologist for possible pacemaker insertion Our scheduler will call you to schedule  Your physician wants you to follow-up in: 3 months with Dr. Acie Fredrickson.  You will receive a reminder letter in the mail two months in advance. If you don't receive a letter, please call our office to schedule the follow-up appointment.   If you need a refill on your cardiac medications before your next appointment, please call your pharmacy.   Thank you for choosing [facility]! Christen Bame, RN [phone]

## 2016-12-03 ENCOUNTER — Telehealth: Payer: Self-pay | Admitting: Cardiovascular Disease

## 2016-12-03 NOTE — Telephone Encounter (Signed)
New message   Pt wife verbalized that is very weak and lethargic    In the last few days  Please call pt wife back

## 2016-12-03 NOTE — Telephone Encounter (Signed)
Agree with Timothy [name] that these blood pressure readings are not all that high for now. The more important issue is that his HR is higher. He had had any of the "gray out" issues that I think were caused by the long pauses.

## 2016-12-03 NOTE — Telephone Encounter (Signed)
Called patient back about his message left from his wife. Since Monday patient stated that his energy level has really decreased. Patient stated he has episodes of when he can feel his heart racing. Patient stated he thinks when this happens that he is in A. Fib. Patient stated since stopping his atenolol his SBP is in the 150's and HR is 60's. Informed patient that his BP is a little elevated, but not too bad and HR is fine. Will forward to Dr. Johnsie Cancel for further advisement.   Patient also wanted to know when his appt will be with EP doctor. Will send message to scheduling to call patient to make appt. Patient verbalized understanding.

## 2016-12-05 NOTE — Telephone Encounter (Signed)
The patient was having episodes of weakness - he referred to then as "graying out" We have thought that these were due to his pauses  Have these presyncopal episodes improved since he stopped the atenolol ?

## 2016-12-05 NOTE — Telephone Encounter (Signed)
Spoke with patient who states he is having worsening fatigue and higher BP. He reports systolic BP is 410-301 mmHg and diastolic is 31-43 mmHg. He reports pulse is 55-65 bpm, states 114 bpm recorded on one occasion. He states that if his HR feels rapid and if it causes him to feel SOB, that he will sit and rest and symptoms resolve. He states he has no syncopal episodes and feels well most of the time.I advised him to continue to monitor BP since he has just recently discontinued the atenolol. I advised that if it remains high, he may need an additional BP medication. I advised that he is scheduled with Dr. Rayann Heman on 10/31. I advised him to call back if he has any further syncopal episodes or with any questions or concerns. Patient verbalized understanding and agreement and was grateful for the call.

## 2016-12-24 ENCOUNTER — Encounter: Payer: Self-pay | Admitting: Internal Medicine

## 2016-12-24 ENCOUNTER — Ambulatory Visit (INDEPENDENT_AMBULATORY_CARE_PROVIDER_SITE_OTHER): Payer: Medicare Other | Admitting: Internal Medicine

## 2016-12-24 VITALS — BP 136/58 | HR 54 | Ht 69.0 in | Wt 167.4 lb

## 2016-12-24 DIAGNOSIS — R002 Palpitations: Secondary | ICD-10-CM | POA: Diagnosis not present

## 2016-12-24 DIAGNOSIS — R001 Bradycardia, unspecified: Secondary | ICD-10-CM

## 2016-12-24 DIAGNOSIS — I480 Paroxysmal atrial fibrillation: Secondary | ICD-10-CM | POA: Diagnosis not present

## 2016-12-24 DIAGNOSIS — I119 Hypertensive heart disease without heart failure: Secondary | ICD-10-CM | POA: Diagnosis not present

## 2016-12-24 NOTE — Progress Notes (Signed)
Electrophysiology Office Note   Date:  12/28/2016   ID:  Timothy, [name] 07/03/1940, MRN [id number]  PCP:  Crist Infante, MD  Cardiologist:  Dr Acie Fredrickson Primary Electrophysiologist: Thompson Grayer, MD    Chief Complaint  Patient presents with  . Atrial Fibrillation     History of Present Illness: Timothy [name] is a 76 y.o. male who presents today for electrophysiology evaluation.   He is well known to Dr Acie Fredrickson.  He recently begun having presyncope/ dizziness.  An event monitor revealed post termination pauses and his atenolol was discontinued.  His symptoms resolved.  He has afib also.  He has difficulty articulating symptoms.  He has occasional palpitations and also fatigue.  It is not clear if his fatigue correlates to bradycardia, afib, or neither.  He remains active.  He continues to do yard work.   Today, he denies symptoms of chest pain, shortness of breath, orthopnea, PND, lower extremity edema, claudication,  bleeding, or neurologic sequela. The patient is tolerating medications without difficulties and is otherwise without complaint today.    Past Medical History:  Diagnosis Date  . Anal fissure    ?  . Bright red rectal bleeding   . C2 cervical fracture ([place]) 06/24/2014  . Diverticulosis   . GERD (gastroesophageal reflux disease)   . H/O prostate cancer 06/05/2011   S/p radiation rx 2010   . Hemorrhage of rectum and anus 05/09/2011  . HLD (hyperlipidemia)   . HTN (hypertension)   . Kidney disease    acute glomeral nepritis  . Paroxysmal atrial fibrillation (HCC)   . Peripheral vascular disease ([place])    carotid artery stent ( right  )  . Prostate cancer ([place])   . 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 Sinus bradycardia    Past Surgical History:  Procedure Laterality Date  . CAROTID STENT    . external beam radiation to prostate  2009     Current Outpatient Medications  Medication Sig Dispense Refill  . allopurinol (ZYLOPRIM) 300 MG tablet Take 300 mg  by mouth daily.     Marland Kitchen amLODipine (NORVASC) 10 MG tablet Take 10 mg by mouth daily.    Marland Kitchen apixaban (ELIQUIS) 5 MG TABS tablet Take 1 tablet (5 mg total) by mouth 2 (two) times daily. 180 tablet 3  . furosemide (LASIX) 40 MG tablet Take 40 mg by mouth daily.    Marland Kitchen omeprazole (PRILOSEC) 20 MG capsule Take 20 mg by mouth daily.    . rosuvastatin (CRESTOR) 20 MG tablet     . telmisartan (MICARDIS) 80 MG tablet Take 80 mg by mouth daily.     No current facility-administered medications for this visit.     Allergies:   Patient has no known allergies.   Social History:  The patient  reports that he has quit smoking. His smoking use included cigarettes. he has never used smokeless tobacco. He reports that he drinks alcohol. He reports that he does not use drugs.   Family History:  The patient's  family history includes Atrial fibrillation in his brother; Brain cancer in his brother.    ROS:  Please see the history of present illness.   All other systems are personally reviewed and negative.    PHYSICAL EXAM: VS:  BP (!) 136/58   Pulse (!) 54   Ht 5\' 9"  (1.753 m)   Wt 167 lb 6.4 oz (75.9 kg)   SpO2 98%   BMI 24.72 kg/m  , BMI Body  mass index is 24.72 kg/m. GEN: Well nourished, well developed, in no acute distress  HEENT: normal  Neck: no JVD, carotid bruits, or masses Cardiac: RRR; no murmurs, rubs, or gallops,no edema  Respiratory:  clear to auscultation bilaterally, normal work of breathing GI: soft, nontender, nondistended, + BS MS: no deformity or atrophy  Skin: warm and dry  Neuro:  Strength and sensation are intact Psych: euthymic mood, full affect  EKG:  EKG is ordered today. The ekg ordered today is personally reviewed and shows sinus rhythm 54 bpm, PR 166 msec, QRS 98 msec, Qtc 377 msec, LVH     Wt Readings from Last 3 Encounters:  12/24/16 167 lb 6.4 oz (75.9 kg)  11/28/16 172 lb (78 kg)  10/28/16 167 lb 6.4 oz (75.9 kg)      Other studies personally  reviewed: Additional studies/ records that were reviewed today include: Dr Elmarie Shiley notes, recent event monitor  Review of the above records today demonstrates: as above   ASSESSMENT AND PLAN:  Paroxysmal atrial fibrillation/ post termination pauses The patient has a h/o afib. He did have symptomatic post termination pauses, however these have resolved off of atenolol.  He is not certain as to whether his symptoms of fatigue correlate to afib, sinus bradycardia or neither.  He is active and symptoms are currently mild. At this time, I think that it is better to try to characterize the cause of his fatigue.  I have asked that he journal BP and heart rate during times of fatigue.  If we find that bradycardia is the cause, then we will further consider pacing.  If we find that afib is the cause, then we will consider AAD therapy. Continue eliquis at this time  Hypertensive cardiovascular disease Stable No change required today    Follow-up:  Return to see me in 6 weeks  Current medicines are reviewed at length with the patient today.   The patient does not have concerns regarding his medicines.  The following changes were made today:  none  Labs/ tests ordered today include:  Orders Placed This Encounter  Procedures  . EKG 12-Lead     Signed, Thompson Grayer, MD  12/28/2016 10:01 PM     [address] [place] [address] [phone] (office) [phone] (fax)

## 2016-12-24 NOTE — Patient Instructions (Signed)
Medication Instructions:  Your physician recommends that you continue on your current medications as directed. Please refer to the Current Medication list given to you today.  -- If you need a refill on your cardiac medications before your next appointment, please call your pharmacy. --  Labwork: None ordered  Testing/Procedures: None ordered  Follow-Up: Your physician wants you to follow-up in: 6 weeks with Dr. Rayann Heman.  You will receive a reminder letter in the mail two months in advance. If you don't receive a letter, please call our office to schedule the follow-up appointment.  Thank you for choosing [facility]!!   Frederik Schmidt, RN [phone]  Any Other Special Instructions Will Be Listed Below (If Applicable).

## 2016-12-28 ENCOUNTER — Encounter: Payer: Self-pay | Admitting: Internal Medicine

## 2017-01-21 DIAGNOSIS — N184 Chronic kidney disease, stage 4 (severe): Secondary | ICD-10-CM | POA: Diagnosis not present

## 2017-01-28 ENCOUNTER — Ambulatory Visit: Payer: Medicare Other | Admitting: Cardiovascular Disease

## 2017-01-28 DIAGNOSIS — M79675 Pain in left toe(s): Secondary | ICD-10-CM | POA: Diagnosis not present

## 2017-01-28 DIAGNOSIS — I10 Essential (primary) hypertension: Secondary | ICD-10-CM | POA: Diagnosis not present

## 2017-01-28 DIAGNOSIS — M109 Gout, unspecified: Secondary | ICD-10-CM | POA: Diagnosis not present

## 2017-01-28 DIAGNOSIS — Z6826 Body mass index (BMI) 26.0-26.9, adult: Secondary | ICD-10-CM | POA: Diagnosis not present

## 2017-01-28 DIAGNOSIS — N184 Chronic kidney disease, stage 4 (severe): Secondary | ICD-10-CM | POA: Diagnosis not present

## 2017-02-04 ENCOUNTER — Telehealth: Payer: Self-pay

## 2017-02-04 NOTE — Telephone Encounter (Signed)
The pts wife called requesting that we do a tier exception on the pts Eliquis for the year of 2019. I have advised her that we cannot do a tier exception for 2019 until February 24, 2017. She states that she will fax the pts new insurance info to [place] as he is changing to [employer] at the first of the year.

## 2017-02-05 ENCOUNTER — Telehealth: Payer: Self-pay | Admitting: *Deleted

## 2017-02-05 NOTE — Telephone Encounter (Signed)
Patients wife was in the office for an appointment and stated to the front desk that she was told by our office yesterday to request samples of eliquis for the patient when she came in today. I will provide one week of samples.

## 2017-02-09 DIAGNOSIS — N184 Chronic kidney disease, stage 4 (severe): Secondary | ICD-10-CM | POA: Diagnosis not present

## 2017-02-18 DIAGNOSIS — R001 Bradycardia, unspecified: Secondary | ICD-10-CM | POA: Insufficient documentation

## 2017-02-18 DIAGNOSIS — E785 Hyperlipidemia, unspecified: Secondary | ICD-10-CM | POA: Insufficient documentation

## 2017-02-18 DIAGNOSIS — K5790 Diverticulosis of intestine, part unspecified, without perforation or abscess without bleeding: Secondary | ICD-10-CM | POA: Insufficient documentation

## 2017-02-18 DIAGNOSIS — N289 Disorder of kidney and ureter, unspecified: Secondary | ICD-10-CM | POA: Insufficient documentation

## 2017-02-18 DIAGNOSIS — N183 Chronic kidney disease, stage 3 unspecified: Secondary | ICD-10-CM | POA: Insufficient documentation

## 2017-02-18 DIAGNOSIS — I480 Paroxysmal atrial fibrillation: Secondary | ICD-10-CM | POA: Insufficient documentation

## 2017-02-18 DIAGNOSIS — K602 Anal fissure, unspecified: Secondary | ICD-10-CM | POA: Insufficient documentation

## 2017-02-18 DIAGNOSIS — K625 Hemorrhage of anus and rectum: Secondary | ICD-10-CM | POA: Insufficient documentation

## 2017-02-18 DIAGNOSIS — N189 Chronic kidney disease, unspecified: Secondary | ICD-10-CM | POA: Insufficient documentation

## 2017-02-18 DIAGNOSIS — C61 Malignant neoplasm of prostate: Secondary | ICD-10-CM | POA: Insufficient documentation

## 2017-02-18 DIAGNOSIS — I739 Peripheral vascular disease, unspecified: Secondary | ICD-10-CM | POA: Insufficient documentation

## 2017-02-18 DIAGNOSIS — K219 Gastro-esophageal reflux disease without esophagitis: Secondary | ICD-10-CM | POA: Insufficient documentation

## 2017-02-25 ENCOUNTER — Other Ambulatory Visit: Payer: Self-pay

## 2017-02-25 MED ORDER — APIXABAN 5 MG PO TABS: 5.0000 mg | ORAL_TABLET | Freq: Two times a day (BID) | ORAL | 0 refills | Status: DC

## 2017-02-26 ENCOUNTER — Telehealth: Payer: Self-pay | Admitting: *Deleted

## 2017-02-26 NOTE — Telephone Encounter (Signed)
Patients wife has ask for a prior authorization and tier exception for ELIQUIS. We have the new information for change in insurance. I called [facility] and they state a PA is not needed, they state the cost of the medication is high because the patient has a deductible. I called wife and informed her of this information, she states they have a 3 week supply of eliquis. I informed her I would complete a tier reduction form and fax to [facility].

## 2017-03-02 ENCOUNTER — Telehealth: Payer: Self-pay

## 2017-03-02 ENCOUNTER — Telehealth: Payer: Self-pay | Admitting: Internal Medicine

## 2017-03-02 ENCOUNTER — Other Ambulatory Visit: Payer: Self-pay

## 2017-03-02 MED ORDER — APIXABAN 5 MG PO TABS: 5.0000 mg | ORAL_TABLET | Freq: Two times a day (BID) | ORAL | 3 refills | Status: DC

## 2017-03-02 NOTE — Telephone Encounter (Signed)
The pts wife, [name], dropped a letter off in the front office stating that they received a call from [facility]) letting them know that the pts PA/Tier exception has been approved for Eliquis through his insurance. Per Lynn's request in the letter I have sent the pts Eliquis refills to [facility] for a 90 day supply with 3 refills.

## 2017-03-02 NOTE — Telephone Encounter (Signed)
Walk in pt Form-letter dropped off for [employer] to [employer]

## 2017-03-03 NOTE — Telephone Encounter (Signed)
**Note De-Identified [name] Obfuscation** We received a denial letter Timothy [name] fax from [facility] on the pts Eliquis tier exception. Denial reason: Eliquis is a brand name product on tier 3 that cannot be lowered in tiers because the formulary does not have any brand products on a lower tier to be eligible for lowering the pts copayment.  I have spoken with the pts wife, Timothy [name], and made her aware. She thanked me for my help and stated that they can afford Eliquis as it is $110 for a 90 day supply with the pts new insurance.

## 2017-03-06 ENCOUNTER — Encounter: Payer: Self-pay | Admitting: Internal Medicine

## 2017-03-06 ENCOUNTER — Ambulatory Visit (INDEPENDENT_AMBULATORY_CARE_PROVIDER_SITE_OTHER): Payer: Medicare Other | Admitting: Internal Medicine

## 2017-03-06 VITALS — BP 126/58 | HR 59 | Ht 69.0 in | Wt 164.8 lb

## 2017-03-06 DIAGNOSIS — R001 Bradycardia, unspecified: Secondary | ICD-10-CM

## 2017-03-06 DIAGNOSIS — I10 Essential (primary) hypertension: Secondary | ICD-10-CM

## 2017-03-06 DIAGNOSIS — I480 Paroxysmal atrial fibrillation: Secondary | ICD-10-CM | POA: Diagnosis not present

## 2017-03-06 NOTE — Progress Notes (Signed)
PCP: Crist Infante, MD Primary Cardiologist: Dr Acie Fredrickson Primary EP: Dr Doristine Counter is a 77 y.o. male who presents today for routine electrophysiology followup.  Since last being seen in our clinic, the patient reports doing reasonably well.  Energy is good.  BP is improved.  Heart rates are mostly improved.  He has occasional fatigue but is mostly doing well.  Unaware of symptoms of afib. Today, he denies symptoms of palpitations, chest pain, shortness of breath,  lower extremity edema, dizziness, presyncope, or syncope.  The patient is otherwise without complaint today.   Past Medical History:  Diagnosis Date  . Anal fissure    ?  . Bright red rectal bleeding   . C2 cervical fracture ([place]) 06/24/2014  . Chronic renal failure   . Diverticulosis   . GERD (gastroesophageal reflux disease)   . H/O prostate cancer 06/05/2011   S/p radiation rx 2010   . Hemorrhage of rectum and anus 05/09/2011  . HLD (hyperlipidemia)   . HTN (hypertension)   . Kidney disease    acute glomeral nepritis  . Paroxysmal atrial fibrillation (HCC)   . Peripheral vascular disease ([place])    carotid artery stent ( right  )  . Prostate cancer ([place])   . 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 Sinus bradycardia    Past Surgical History:  Procedure Laterality Date  . CAROTID STENT    . external beam radiation to prostate  2009  . FLEXIBLE SIGMOIDOSCOPY  05/22/2011   Procedure: FLEXIBLE SIGMOIDOSCOPY;  Surgeon: [name], MD;  Location: [facility];  Service: Endoscopy;  Laterality: N/A;  . HOT HEMOSTASIS  05/22/2011   Procedure: HOT HEMOSTASIS (ARGON PLASMA COAGULATION/BICAP);  Surgeon: [name], MD;  Location: [facility];  Service: Endoscopy;  Laterality: N/A;    ROS- all systems are reviewed and negatives except as per HPI above  Current Outpatient Medications  Medication Sig Dispense Refill  . allopurinol (ZYLOPRIM) 300 MG tablet Take 300 mg by mouth daily.     Marland Kitchen amLODipine  (NORVASC) 10 MG tablet Take 10 mg by mouth daily.    Marland Kitchen apixaban (ELIQUIS) 5 MG TABS tablet Take 1 tablet (5 mg total) by mouth 2 (two) times daily. 180 tablet 3  . furosemide (LASIX) 40 MG tablet Take 80 mg by mouth daily.     Marland Kitchen omeprazole (PRILOSEC) 20 MG capsule Take 20 mg by mouth daily.    . rosuvastatin (CRESTOR) 20 MG tablet     . spironolactone (ALDACTONE) 25 MG tablet Take 25 mg by mouth daily.    Marland Kitchen telmisartan (MICARDIS) 80 MG tablet Take 80 mg by mouth daily.     No current facility-administered medications for this visit.     Physical Exam: Vitals:   03/06/17 1143  BP: (!) 126/58  Pulse: (!) 59  SpO2: 98%  Weight: 164 lb 12.8 oz (74.8 kg)  Height: 5\' 9"  (1.753 m)    GEN- The patient is well appearing, alert and oriented x 3 today.   Head- normocephalic, atraumatic Eyes-  Sclera clear, conjunctiva pink Ears- hearing intact Oropharynx- clear Lungs- Clear to ausculation bilaterally, normal work of breathing Heart- Regular rate and rhythm, no murmurs, rubs or gallops, PMI not laterally displaced GI- soft, NT, ND, + BS Extremities- no clubbing, cyanosis, or edema  EKG tracing ordered today is personally reviewed and shows sinus rhythm 59 bpm, nonspecific ST/T changes  Assessment and Plan:  1. Sinus bradycardia Much improved off of atenolol  Will obtain ETT to evaluate heart rate response to exercise. If he has an appropriate heart rate response to exercise then I doubt that pacemaker implant would offer benefit.  2. afib Appears controlled Unaware of symptoms  3. Hypertensive cardiovascular disease Stable No change required today  Return to see EP NP in 6 weeks If doing well at that time, may return care to Dr Acie Fredrickson.  Thompson Grayer MD, [facility] 03/06/2017 12:21 PM

## 2017-03-06 NOTE — Patient Instructions (Addendum)
Medication Instructions:  Your physician recommends that you continue on your current medications as directed. Please refer to the Current Medication list given to you today.   Labwork: None ordered   Testing/Procedures: Your physician has requested that you have an exercise tolerance test. For further information please visit [email or web address]. Please also follow instruction sheet, as given.    Follow-Up: Your physician recommends that you schedule a follow-up appointment in: 6 weeks with Chanetta Marshall, NP

## 2017-03-11 ENCOUNTER — Ambulatory Visit (INDEPENDENT_AMBULATORY_CARE_PROVIDER_SITE_OTHER): Payer: Medicare Other

## 2017-03-11 DIAGNOSIS — I480 Paroxysmal atrial fibrillation: Secondary | ICD-10-CM

## 2017-03-11 LAB — EXERCISE TOLERANCE TEST
CHL CUP MPHR: 146 {beats}/min
CHL CUP RESTING HR STRESS: 73 {beats}/min
CHL RATE OF PERCEIVED EXERTION: 16
CSEPED: 3 min
Estimated workload: 5.3 METS
Exercise duration (sec): 36 s
Peak HR: 133 {beats}/min
Percent HR: 91 %

## 2017-03-16 ENCOUNTER — Encounter: Payer: Self-pay | Admitting: *Deleted

## 2017-04-01 ENCOUNTER — Encounter: Payer: Self-pay | Admitting: Internal Medicine

## 2017-04-01 ENCOUNTER — Ambulatory Visit (INDEPENDENT_AMBULATORY_CARE_PROVIDER_SITE_OTHER): Payer: Medicare Other | Admitting: Internal Medicine

## 2017-04-01 VITALS — BP 126/70 | HR 66 | Ht 69.0 in | Wt 166.0 lb

## 2017-04-01 DIAGNOSIS — I480 Paroxysmal atrial fibrillation: Secondary | ICD-10-CM

## 2017-04-01 DIAGNOSIS — R0602 Shortness of breath: Secondary | ICD-10-CM

## 2017-04-01 DIAGNOSIS — R5383 Other fatigue: Secondary | ICD-10-CM

## 2017-04-01 NOTE — Patient Instructions (Addendum)
Medication Instructions:  Your physician recommends that you continue on your current medications as directed. Please refer to the Current Medication list given to you today.   Labwork: None ordered  Testing/Procedures: Your physician has requested that you have en exercise stress myoview next available or in 1 week. For further information please visit [email or web address]. Please follow instruction sheet, as given.  Follow-Up: Your physician recommends that you schedule a follow-up appointment in: 4 weeks with Dr. Acie Fredrickson  Your physician recommends that you follow up with Dr. Rayann Heman AS NEEDED     Any Other Special Instructions Will Be Listed Below (If Applicable).     If you need a refill on your cardiac medications before your next appointment, please call your pharmacy.

## 2017-04-01 NOTE — Progress Notes (Signed)
PCP: Crist Infante, MD Primary Cardiologist: Dr Acie Fredrickson Primary EP: Dr Doristine Counter is a 77 y.o. male who presents today for routine electrophysiology followup.  Since last being seen in our clinic, the patient reports doing reasonably well.  He continues to have primarily fatigue but also SOB with moderate activity. Recent ETT revealed preserved HR response to activity, though he did have ST segment changes.  He is brought back in today to discuss these findings. Today, he denies symptoms of palpitations, chest pain,  lower extremity edema, dizziness, presyncope, or syncope.  The patient is otherwise without complaint today.   Past Medical History:  Diagnosis Date  . Anal fissure    ?  . Bright red rectal bleeding   . C2 cervical fracture ([place]) 06/24/2014  . Chronic renal failure   . Diverticulosis   . GERD (gastroesophageal reflux disease)   . H/O prostate cancer 06/05/2011   S/p radiation rx 2010   . Hemorrhage of rectum and anus 05/09/2011  . HLD (hyperlipidemia)   . HTN (hypertension)   . Kidney disease    acute glomeral nepritis  . Paroxysmal atrial fibrillation (HCC)   . Peripheral vascular disease ([place])    carotid artery stent ( right  )  . Prostate cancer ([place])   . 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 Sinus bradycardia    Past Surgical History:  Procedure Laterality Date  . CAROTID STENT    . external beam radiation to prostate  2009  . FLEXIBLE SIGMOIDOSCOPY  05/22/2011   Procedure: FLEXIBLE SIGMOIDOSCOPY;  Surgeon: [name], MD;  Location: [facility];  Service: Endoscopy;  Laterality: N/A;  . HOT HEMOSTASIS  05/22/2011   Procedure: HOT HEMOSTASIS (ARGON PLASMA COAGULATION/BICAP);  Surgeon: [name], MD;  Location: [facility];  Service: Endoscopy;  Laterality: N/A;    ROS- all systems are reviewed and negatives except as per HPI above  Current Outpatient Medications  Medication Sig Dispense Refill  . allopurinol (ZYLOPRIM) 300 MG  tablet Take 300 mg by mouth daily.     Marland Kitchen amLODipine (NORVASC) 10 MG tablet Take 10 mg by mouth daily.    Marland Kitchen apixaban (ELIQUIS) 5 MG TABS tablet Take 1 tablet (5 mg total) by mouth 2 (two) times daily. 180 tablet 3  . furosemide (LASIX) 40 MG tablet Take 80 mg by mouth daily.     Marland Kitchen omeprazole (PRILOSEC) 20 MG capsule Take 20 mg by mouth daily.    . rosuvastatin (CRESTOR) 20 MG tablet     . spironolactone (ALDACTONE) 25 MG tablet Take 25 mg by mouth daily.    Marland Kitchen telmisartan (MICARDIS) 80 MG tablet Take 80 mg by mouth daily.     No current facility-administered medications for this visit.     Physical Exam: Vitals:   04/01/17 1343  BP: 126/70  Pulse: 66  Weight: 166 lb (75.3 kg)  Height: 5\' 9"  (1.753 m)    GEN- The patient is well appearing, alert and oriented x 3 today.   Head- normocephalic, atraumatic Eyes-  Sclera clear, conjunctiva pink Ears- hearing intact Oropharynx- clear Lungs- Clear to ausculation bilaterally, normal work of breathing Heart- Regular rate and rhythm, no murmurs, rubs or gallops, PMI not laterally displaced GI- soft, NT, ND, + BS Extremities- no clubbing, cyanosis, or edema  EKG tracing ordered today is personally reviewed and shows sinus rhythm 66 bpm, nonspecific ST/T changes  Assessment and Plan:  1. Sinus bradycardia Improved off atenolol Good heart rate  response to exercise on ETT No indication for pacing  2. afib Well controlled On eliquis I dont think fatigue is caused by afib  3. Hypertensive cardiovascular disease Stable No change required today  4. SOB/ fatigue Abnormal ETT discussed with patient Next step is further ischemic eval.  Given his CRI, I would advise myoview over cardiac CT or cath.  If low risk, medical management would be advised I will schedule follow-up with Dr Acie Fredrickson.   I will see as needed going forward  Thompson Grayer MD, [facility] 04/01/2017 2:04 PM

## 2017-04-03 ENCOUNTER — Ambulatory Visit: Payer: Medicare Other | Admitting: Nurse Practitioner

## 2017-04-07 ENCOUNTER — Telehealth (HOSPITAL_COMMUNITY): Payer: Self-pay | Admitting: *Deleted

## 2017-04-07 NOTE — Telephone Encounter (Signed)
Patient given detailed instructions per Myocardial Perfusion Study Information Sheet for the test on 04/10/17 at 1100. Patient notified to arrive 15 minutes early and that it is imperative to arrive on time for appointment to keep from having the test rescheduled.  If you need to cancel or reschedule your appointment, please call the office within 24 hours of your appointment. . Patient verbalized understanding.Timothy [name], Ranae [place]ms

## 2017-04-10 ENCOUNTER — Ambulatory Visit (HOSPITAL_COMMUNITY): Payer: Medicare Other | Attending: Internal Medicine

## 2017-04-10 DIAGNOSIS — I4891 Unspecified atrial fibrillation: Secondary | ICD-10-CM | POA: Diagnosis not present

## 2017-04-10 DIAGNOSIS — R0609 Other forms of dyspnea: Secondary | ICD-10-CM | POA: Diagnosis not present

## 2017-04-10 DIAGNOSIS — I2510 Atherosclerotic heart disease of native coronary artery without angina pectoris: Secondary | ICD-10-CM | POA: Diagnosis not present

## 2017-04-10 DIAGNOSIS — R0602 Shortness of breath: Secondary | ICD-10-CM | POA: Diagnosis not present

## 2017-04-10 DIAGNOSIS — R5383 Other fatigue: Secondary | ICD-10-CM

## 2017-04-10 LAB — MYOCARDIAL PERFUSION IMAGING
CHL CUP NUCLEAR SDS: 0
CHL CUP NUCLEAR SRS: 2
CHL CUP RESTING HR STRESS: 86 {beats}/min
CSEPHR: 88 %
CSEPPHR: 129 {beats}/min
Estimated workload: 7 METS
Exercise duration (min): 6 min
Exercise duration (sec): 0 s
LV dias vol: 89 mL (ref 62–150)
LV sys vol: 31 mL
MPHR: 146 {beats}/min
RATE: 0.28
SSS: 2
TID: 0.89

## 2017-04-10 MED ORDER — TECHNETIUM TC 99M TETROFOSMIN IV KIT
32.7000 | PACK | Freq: Once | INTRAVENOUS | Status: AC | PRN
  Administered 2017-04-10: 32.7 via INTRAVENOUS
  Filled 2017-04-10: qty 33

## 2017-04-10 MED ORDER — TECHNETIUM TC 99M TETROFOSMIN IV KIT
10.4000 | PACK | Freq: Once | INTRAVENOUS | Status: AC | PRN
  Administered 2017-04-10: 10.4 via INTRAVENOUS
  Filled 2017-04-10: qty 11

## 2017-04-17 ENCOUNTER — Encounter: Payer: Self-pay | Admitting: *Deleted

## 2017-04-17 DIAGNOSIS — R946 Abnormal results of thyroid function studies: Secondary | ICD-10-CM | POA: Diagnosis not present

## 2017-04-17 DIAGNOSIS — I129 Hypertensive chronic kidney disease with stage 1 through stage 4 chronic kidney disease, or unspecified chronic kidney disease: Secondary | ICD-10-CM | POA: Diagnosis not present

## 2017-04-17 DIAGNOSIS — N184 Chronic kidney disease, stage 4 (severe): Secondary | ICD-10-CM | POA: Diagnosis not present

## 2017-04-17 DIAGNOSIS — M109 Gout, unspecified: Secondary | ICD-10-CM | POA: Diagnosis not present

## 2017-04-17 DIAGNOSIS — E785 Hyperlipidemia, unspecified: Secondary | ICD-10-CM | POA: Diagnosis not present

## 2017-04-17 DIAGNOSIS — N189 Chronic kidney disease, unspecified: Secondary | ICD-10-CM | POA: Diagnosis not present

## 2017-04-17 DIAGNOSIS — D631 Anemia in chronic kidney disease: Secondary | ICD-10-CM | POA: Diagnosis not present

## 2017-04-17 DIAGNOSIS — I779 Disorder of arteries and arterioles, unspecified: Secondary | ICD-10-CM | POA: Diagnosis not present

## 2017-04-17 DIAGNOSIS — Z0000 Encounter for general adult medical examination without abnormal findings: Secondary | ICD-10-CM | POA: Diagnosis not present

## 2017-04-17 DIAGNOSIS — C61 Malignant neoplasm of prostate: Secondary | ICD-10-CM | POA: Diagnosis not present

## 2017-04-17 DIAGNOSIS — N2581 Secondary hyperparathyroidism of renal origin: Secondary | ICD-10-CM | POA: Diagnosis not present

## 2017-04-24 ENCOUNTER — Encounter: Payer: Self-pay | Admitting: Cardiovascular Disease

## 2017-04-24 ENCOUNTER — Ambulatory Visit (INDEPENDENT_AMBULATORY_CARE_PROVIDER_SITE_OTHER): Payer: Medicare Other | Admitting: Cardiovascular Disease

## 2017-04-24 VITALS — BP 116/56 | HR 70 | Ht 69.0 in | Wt 164.8 lb

## 2017-04-24 DIAGNOSIS — I10 Essential (primary) hypertension: Secondary | ICD-10-CM | POA: Diagnosis not present

## 2017-04-24 DIAGNOSIS — I480 Paroxysmal atrial fibrillation: Secondary | ICD-10-CM

## 2017-04-24 NOTE — Progress Notes (Signed)
Cardiology Office Note:    Date:  04/24/2017   ID:  Timothy [name], Timothy [name] [birth year], MRN [id number]  PCP:  Timothy Infante, MD  Cardiologist:  Timothy Moores, MD    Referring MD: Timothy Infante, MD   Problem list 1. Paroxysmal Atrial fib - with post conversion pauses CHADS2VASC  =  5  ( age 77 , HTN, CAD, TIA  2. History of premature ventricular contractions 3.  HTN 4. Hyperlipidemia 5. Carotid artery disease-status post stenting around 2004 6.  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 ( Creatinine = 2.0 July 2018)  7.  Hx of TIA - 10-12 years ago   Chief Complaint  Patient presents with  . Atrial Fibrillation    Previous Notes - Sept. 2018    Timothy [name] is a 77 y.o. male with a hx of PVCs and PACs. Presents now with increase palpitations.  Has been diagnosied with PVCs and PACs in the past   Now, these palpitations are more pronounced.   Has a hx of TIAs in the past  Has classical migraine HA The palpitations last for a few seconds.   Not very severe ,  Is active, does not do any regular organized exercise.  Is able to do his chores and yard work without any issues  Has been on Atenolol since age 81 for palpitations   Oct. 5, 2018:  Timothy [name] is seen with wife, Timothy [name].  Still having some palppitations , last for seconds.  No dizziness, no CP or dyspnea.   30 day monitor showed atrial fib and also show some post conversion pauses.  We have gradually decreased his dose of Atenolol   Echo shows normal LV function with grade 1 diastolic dysfunction , mild TR with mild pulmonary HTN  April 24, 2017: Seen wife , Timothy [name] today   Timothy [name] is seen today for follow-up visit.  He has paroxysmal atrial fibrillation.  He had a treadmill test which showed good chronotropic response but he did have some ST changes.  Follow-up stress Myoview study showed no ST segment changes and showed no evidence of ischemia.  Has some lightheadedness.  No syncope.   Has CKD - Creatine up to 2.8  ( sees  Timothy [name] )  Aldactone  Has been stopped , micarditis has been continued.  Has been exercising a little.  Walks 10 min a day  We discussed needing to get more exercise     Past Medical History:  Diagnosis Date  . Anal fissure    ?  . Bright red rectal bleeding   . C2 cervical fracture (Timothy [name]) 06/24/2014  . Chronic renal failure   . Diverticulosis   . GERD (gastroesophageal reflux disease)   . H/O prostate cancer 06/05/2011   S/p radiation rx 2010   . Hemorrhage of rectum and anus 05/09/2011  . HLD (hyperlipidemia)   . HTN (hypertension)   . Kidney disease    acute glomeral nepritis  . Paroxysmal atrial fibrillation (HCC)   . Peripheral vascular disease (Timothy [name])    carotid artery stent ( right  )  . Prostate cancer ([place])   . 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 Sinus bradycardia     Past Surgical History:  Procedure Laterality Date  . CAROTID STENT    . external beam radiation to prostate  2009  . FLEXIBLE SIGMOIDOSCOPY  05/22/2011   Procedure: FLEXIBLE SIGMOIDOSCOPY;  Surgeon: [name], MD;  Location: [facility];  Service: Endoscopy;  Laterality: N/A;  .  HOT HEMOSTASIS  05/22/2011   Procedure: HOT HEMOSTASIS (ARGON PLASMA COAGULATION/BICAP);  Surgeon: [name], MD;  Location: [facility];  Service: Endoscopy;  Laterality: N/A;    Current Medications: Current Meds  Medication Sig  . allopurinol (ZYLOPRIM) 300 MG tablet Take 300 mg by mouth daily.   Marland Kitchen amLODipine (NORVASC) 10 MG tablet Take 10 mg by mouth daily.  Marland Kitchen apixaban (ELIQUIS) 5 MG TABS tablet Take 1 tablet (5 mg total) by mouth 2 (two) times daily.  . furosemide (LASIX) 40 MG tablet Take 80 mg by mouth daily.   Marland Kitchen omeprazole (PRILOSEC) 20 MG capsule Take 20 mg by mouth daily.  . rosuvastatin (CRESTOR) 20 MG tablet Take 20 mg by mouth daily.   Marland Kitchen telmisartan (MICARDIS) 80 MG tablet Take 80 mg by mouth daily.     Allergies:   Patient has no known allergies.   Social History   Socioeconomic History    . Marital status: Married    Spouse name: None  . Number of children: 1  . Years of education: None  . Highest education level: None  Social Needs  . Financial resource strain: None  . Food insecurity - worry: None  . Food insecurity - inability: None  . Transportation needs - medical: None  . Transportation needs - non-medical: None  Occupational History  . Occupation: pharmacist  Tobacco Use  . Smoking status: Former Smoker    Types: Cigarettes  . Smokeless tobacco: Never Used  Substance and Sexual Activity  . Alcohol use: Yes    Comment: occas  . Drug use: No  . Sexual activity: None  Other Topics Concern  . None  Social History Narrative  . None     Family History: The patient's family history includes Atrial fibrillation in his brother; Brain cancer in his brother. There is no history of Colon cancer. ROS:   Please see the history of present illness.     All other systems reviewed and are negative.  EKGs/Labs/Other Studies Reviewed:    The following studies were reviewed today: Records and labs from Dr. Joylene Draft  EKG:  EKG is  ordered today.  The ekg ordered today demonstrates sinus brady   Recent Labs: No results found for requested labs within last 8760 hours.  Recent Lipid Panel No results found for: CHOL, TRIG, HDL, CHOLHDL, VLDL, LDLCALC, LDLDIRECT   Physical Exam: Blood pressure (!) 116/56, pulse 70, height 5\' 9"  (1.753 m), weight 164 lb 12.8 oz (74.8 kg), SpO2 97 %.  GEN:  Well nourished, well developed in no acute distress HEENT: Normal NECK: No JVD; No carotid bruits LYMPHATICS: No lymphadenopathy CARDIAC: RR , occasional premature beats  RESPIRATORY:  Clear to auscultation without rales, wheezing or rhonchi  ABDOMEN: Soft, non-tender, non-distended MUSCULOSKELETAL:  No edema; No deformity  SKIN: Warm and dry NEUROLOGIC:  Alert and oriented x 3     ASSESSMENT:    No diagnosis found. PLAN:    In order of problems listed above:  1.   Paroxysmal Atrial fib:  Has maintained NSR .   Will ask Dr. Joylene Draft to assume responsibility and management of theEliquis   I will see him as neede.   2. Postconversion pauses.  Has not been seen since   I'll see him in the office in 3 months.  3. Essential hypertension:    BP is well controlled.   Management by Dr. Jimmy Footman   Medication Adjustments/Labs and Tests Ordered: Current medicines are reviewed at length with  the patient today.  Concerns regarding medicines are outlined above.  No orders of the defined types were placed in this encounter.  No orders of the defined types were placed in this encounter.   Signed, Timothy Moores, MD  04/24/2017 5:25 PM    [facility]

## 2017-04-24 NOTE — Patient Instructions (Signed)
Medication Instructions:  Your physician recommends that you continue on your current medications as directed. Please refer to the Current Medication list given to you today.   Labwork: None Ordered   Testing/Procedures: None Ordered   Follow-Up: Your physician recommends that you schedule a follow-up appointment in: as needed with Dr. Nahser   If you need a refill on your cardiac medications before your next appointment, please call your pharmacy.   Thank you for choosing [facility]! Landin Tallon, RN [phone]    

## 2017-04-29 DIAGNOSIS — D692 Other nonthrombocytopenic purpura: Secondary | ICD-10-CM | POA: Diagnosis not present

## 2017-04-29 DIAGNOSIS — D2272 Melanocytic nevi of left lower limb, including hip: Secondary | ICD-10-CM | POA: Diagnosis not present

## 2017-04-29 DIAGNOSIS — L821 Other seborrheic keratosis: Secondary | ICD-10-CM | POA: Diagnosis not present

## 2017-04-29 DIAGNOSIS — D2262 Melanocytic nevi of left upper limb, including shoulder: Secondary | ICD-10-CM | POA: Diagnosis not present

## 2017-04-29 DIAGNOSIS — Z85828 Personal history of other malignant neoplasm of skin: Secondary | ICD-10-CM | POA: Diagnosis not present

## 2017-05-13 DIAGNOSIS — N184 Chronic kidney disease, stage 4 (severe): Secondary | ICD-10-CM | POA: Diagnosis not present

## 2017-06-03 ENCOUNTER — Other Ambulatory Visit: Payer: Self-pay | Admitting: Internal Medicine

## 2017-06-03 DIAGNOSIS — Z1389 Encounter for screening for other disorder: Secondary | ICD-10-CM | POA: Diagnosis not present

## 2017-06-03 DIAGNOSIS — Z6823 Body mass index (BMI) 23.0-23.9, adult: Secondary | ICD-10-CM | POA: Diagnosis not present

## 2017-06-03 DIAGNOSIS — N63 Unspecified lump in unspecified breast: Secondary | ICD-10-CM | POA: Diagnosis not present

## 2017-06-03 DIAGNOSIS — N6310 Unspecified lump in the right breast, unspecified quadrant: Secondary | ICD-10-CM

## 2017-06-10 ENCOUNTER — Ambulatory Visit: Payer: Medicare Other

## 2017-06-10 ENCOUNTER — Ambulatory Visit
Admission: RE | Admit: 2017-06-10 | Discharge: 2017-06-10 | Disposition: A | Discharge status: Home / Self Care | Payer: Medicare Other | Source: Ambulatory Visit | Attending: Internal Medicine | Admitting: Internal Medicine

## 2017-06-10 DIAGNOSIS — N6310 Unspecified lump in the right breast, unspecified quadrant: Secondary | ICD-10-CM

## 2017-06-10 DIAGNOSIS — R928 Other abnormal and inconclusive findings on diagnostic imaging of breast: Secondary | ICD-10-CM | POA: Diagnosis not present

## 2017-06-25 DIAGNOSIS — N184 Chronic kidney disease, stage 4 (severe): Secondary | ICD-10-CM | POA: Diagnosis not present

## 2017-08-17 DIAGNOSIS — C61 Malignant neoplasm of prostate: Secondary | ICD-10-CM | POA: Diagnosis not present

## 2017-08-17 DIAGNOSIS — M109 Gout, unspecified: Secondary | ICD-10-CM | POA: Diagnosis not present

## 2017-08-17 DIAGNOSIS — I4891 Unspecified atrial fibrillation: Secondary | ICD-10-CM | POA: Diagnosis not present

## 2017-08-17 DIAGNOSIS — I779 Disorder of arteries and arterioles, unspecified: Secondary | ICD-10-CM | POA: Diagnosis not present

## 2017-08-17 DIAGNOSIS — I129 Hypertensive chronic kidney disease with stage 1 through stage 4 chronic kidney disease, or unspecified chronic kidney disease: Secondary | ICD-10-CM | POA: Diagnosis not present

## 2017-08-17 DIAGNOSIS — R946 Abnormal results of thyroid function studies: Secondary | ICD-10-CM | POA: Diagnosis not present

## 2017-08-17 DIAGNOSIS — N184 Chronic kidney disease, stage 4 (severe): Secondary | ICD-10-CM | POA: Diagnosis not present

## 2017-08-17 DIAGNOSIS — Z0000 Encounter for general adult medical examination without abnormal findings: Secondary | ICD-10-CM | POA: Diagnosis not present

## 2017-08-17 DIAGNOSIS — D631 Anemia in chronic kidney disease: Secondary | ICD-10-CM | POA: Diagnosis not present

## 2017-08-17 DIAGNOSIS — E785 Hyperlipidemia, unspecified: Secondary | ICD-10-CM | POA: Diagnosis not present

## 2017-08-17 DIAGNOSIS — N2581 Secondary hyperparathyroidism of renal origin: Secondary | ICD-10-CM | POA: Diagnosis not present

## 2017-09-23 DIAGNOSIS — R82998 Other abnormal findings in urine: Secondary | ICD-10-CM | POA: Diagnosis not present

## 2017-09-24 DIAGNOSIS — I10 Essential (primary) hypertension: Secondary | ICD-10-CM | POA: Diagnosis not present

## 2017-09-24 DIAGNOSIS — M109 Gout, unspecified: Secondary | ICD-10-CM | POA: Diagnosis not present

## 2017-09-24 DIAGNOSIS — R7301 Impaired fasting glucose: Secondary | ICD-10-CM | POA: Diagnosis not present

## 2017-09-24 DIAGNOSIS — E7849 Other hyperlipidemia: Secondary | ICD-10-CM | POA: Diagnosis not present

## 2017-10-09 DIAGNOSIS — J302 Other seasonal allergic rhinitis: Secondary | ICD-10-CM | POA: Diagnosis not present

## 2017-10-09 DIAGNOSIS — R011 Cardiac murmur, unspecified: Secondary | ICD-10-CM | POA: Diagnosis not present

## 2017-10-09 DIAGNOSIS — Z1389 Encounter for screening for other disorder: Secondary | ICD-10-CM | POA: Diagnosis not present

## 2017-10-09 DIAGNOSIS — Z6823 Body mass index (BMI) 23.0-23.9, adult: Secondary | ICD-10-CM | POA: Diagnosis not present

## 2017-10-09 DIAGNOSIS — N184 Chronic kidney disease, stage 4 (severe): Secondary | ICD-10-CM | POA: Diagnosis not present

## 2017-10-09 DIAGNOSIS — Z95828 Presence of other vascular implants and grafts: Secondary | ICD-10-CM | POA: Diagnosis not present

## 2017-10-09 DIAGNOSIS — C61 Malignant neoplasm of prostate: Secondary | ICD-10-CM | POA: Diagnosis not present

## 2017-10-09 DIAGNOSIS — E7849 Other hyperlipidemia: Secondary | ICD-10-CM | POA: Diagnosis not present

## 2017-10-09 DIAGNOSIS — Z0000 Encounter for general adult medical examination without abnormal findings: Secondary | ICD-10-CM | POA: Diagnosis not present

## 2017-10-09 DIAGNOSIS — K219 Gastro-esophageal reflux disease without esophagitis: Secondary | ICD-10-CM | POA: Diagnosis not present

## 2017-10-09 DIAGNOSIS — I480 Paroxysmal atrial fibrillation: Secondary | ICD-10-CM | POA: Diagnosis not present

## 2017-10-09 DIAGNOSIS — I7389 Other specified peripheral vascular diseases: Secondary | ICD-10-CM | POA: Diagnosis not present

## 2017-10-14 ENCOUNTER — Other Ambulatory Visit (HOSPITAL_COMMUNITY): Payer: Self-pay | Admitting: Internal Medicine

## 2017-10-14 ENCOUNTER — Ambulatory Visit (HOSPITAL_COMMUNITY)
Admission: RE | Admit: 2017-10-14 | Discharge: 2017-10-14 | Disposition: A | Discharge status: Home / Self Care | Payer: Medicare Other | Source: Ambulatory Visit | Attending: Family | Admitting: Family

## 2017-10-14 DIAGNOSIS — I779 Disorder of arteries and arterioles, unspecified: Secondary | ICD-10-CM

## 2017-10-14 DIAGNOSIS — I739 Peripheral vascular disease, unspecified: Secondary | ICD-10-CM

## 2017-10-14 DIAGNOSIS — I6523 Occlusion and stenosis of bilateral carotid arteries: Secondary | ICD-10-CM | POA: Insufficient documentation

## 2017-10-14 DIAGNOSIS — Z95828 Presence of other vascular implants and grafts: Secondary | ICD-10-CM | POA: Insufficient documentation

## 2017-10-14 DIAGNOSIS — Z09 Encounter for follow-up examination after completed treatment for conditions other than malignant neoplasm: Secondary | ICD-10-CM | POA: Diagnosis present

## 2017-10-16 DIAGNOSIS — Z1212 Encounter for screening for malignant neoplasm of rectum: Secondary | ICD-10-CM | POA: Diagnosis not present

## 2017-11-24 DIAGNOSIS — H35363 Drusen (degenerative) of macula, bilateral: Secondary | ICD-10-CM | POA: Diagnosis not present

## 2017-11-24 DIAGNOSIS — H25013 Cortical age-related cataract, bilateral: Secondary | ICD-10-CM | POA: Diagnosis not present

## 2017-11-24 DIAGNOSIS — H524 Presbyopia: Secondary | ICD-10-CM | POA: Diagnosis not present

## 2017-11-24 DIAGNOSIS — H2513 Age-related nuclear cataract, bilateral: Secondary | ICD-10-CM | POA: Diagnosis not present

## 2017-12-04 DIAGNOSIS — M109 Gout, unspecified: Secondary | ICD-10-CM | POA: Diagnosis not present

## 2017-12-04 DIAGNOSIS — N189 Chronic kidney disease, unspecified: Secondary | ICD-10-CM | POA: Diagnosis not present

## 2017-12-04 DIAGNOSIS — Z0000 Encounter for general adult medical examination without abnormal findings: Secondary | ICD-10-CM | POA: Diagnosis not present

## 2017-12-04 DIAGNOSIS — I739 Peripheral vascular disease, unspecified: Secondary | ICD-10-CM | POA: Diagnosis not present

## 2017-12-04 DIAGNOSIS — N184 Chronic kidney disease, stage 4 (severe): Secondary | ICD-10-CM | POA: Diagnosis not present

## 2017-12-04 DIAGNOSIS — E785 Hyperlipidemia, unspecified: Secondary | ICD-10-CM | POA: Diagnosis not present

## 2017-12-04 DIAGNOSIS — D631 Anemia in chronic kidney disease: Secondary | ICD-10-CM | POA: Diagnosis not present

## 2017-12-04 DIAGNOSIS — N2581 Secondary hyperparathyroidism of renal origin: Secondary | ICD-10-CM | POA: Diagnosis not present

## 2017-12-04 DIAGNOSIS — Z23 Encounter for immunization: Secondary | ICD-10-CM | POA: Diagnosis not present

## 2017-12-04 DIAGNOSIS — R946 Abnormal results of thyroid function studies: Secondary | ICD-10-CM | POA: Diagnosis not present

## 2017-12-04 DIAGNOSIS — I4891 Unspecified atrial fibrillation: Secondary | ICD-10-CM | POA: Diagnosis not present

## 2017-12-04 DIAGNOSIS — I129 Hypertensive chronic kidney disease with stage 1 through stage 4 chronic kidney disease, or unspecified chronic kidney disease: Secondary | ICD-10-CM | POA: Diagnosis not present

## 2017-12-04 DIAGNOSIS — C61 Malignant neoplasm of prostate: Secondary | ICD-10-CM | POA: Diagnosis not present

## 2017-12-22 ENCOUNTER — Ambulatory Visit (INDEPENDENT_AMBULATORY_CARE_PROVIDER_SITE_OTHER): Payer: Self-pay | Admitting: Physician Assistant

## 2017-12-23 ENCOUNTER — Ambulatory Visit (INDEPENDENT_AMBULATORY_CARE_PROVIDER_SITE_OTHER): Payer: Self-pay | Admitting: Physician Assistant

## 2017-12-24 ENCOUNTER — Encounter (INDEPENDENT_AMBULATORY_CARE_PROVIDER_SITE_OTHER): Payer: Self-pay | Admitting: Physician Assistant

## 2017-12-24 ENCOUNTER — Ambulatory Visit (INDEPENDENT_AMBULATORY_CARE_PROVIDER_SITE_OTHER): Payer: Medicare Other | Admitting: Physician Assistant

## 2017-12-24 ENCOUNTER — Ambulatory Visit (INDEPENDENT_AMBULATORY_CARE_PROVIDER_SITE_OTHER): Payer: Medicare Other

## 2017-12-24 DIAGNOSIS — M545 Low back pain, unspecified: Secondary | ICD-10-CM | POA: Insufficient documentation

## 2017-12-24 DIAGNOSIS — G8929 Other chronic pain: Secondary | ICD-10-CM | POA: Diagnosis not present

## 2017-12-24 MED ORDER — PREDNISONE 10 MG (21) PO TBPK: ORAL_TABLET | ORAL | 0 refills | Status: DC

## 2017-12-24 MED ORDER — HYDROCODONE-ACETAMINOPHEN 7.5-325 MG PO TABS: 1.0000 | ORAL_TABLET | Freq: Two times a day (BID) | ORAL | 0 refills | Status: DC | PRN

## 2017-12-24 MED ORDER — METHOCARBAMOL 500 MG PO TABS: 500.0000 mg | ORAL_TABLET | Freq: Two times a day (BID) | ORAL | 0 refills | Status: DC | PRN

## 2017-12-24 NOTE — Progress Notes (Signed)
Office Visit Note   Patient: Timothy [name]           Date of Birth: 1940/11/30           MRN: [id number] Visit Date: 12/24/2017              Requested by: Crist Infante, MD [address] [place], [place] [address] PCP: Crist Infante, MD   Assessment & Plan: Visit Diagnoses:  1. Chronic low back pain, unspecified back pain laterality, unspecified whether sciatica present     Plan: Impression is left sided lumbar radiculopathy.  At this point, I will prescribe a stronger Sterapred taper.  I will also start him on a muscle relaxer.  I gave him a small prescription for Norco as well.  We will go ahead and order an MRI however the patient will cancel this should his symptoms resolve.  He will follow-up with [place] in 4 weeks time for recheck and possible referral to formal physical therapy.  Follow-Up Instructions: Return in about 4 weeks (around 01/21/2018).   Orders:  Orders Placed This Encounter  Procedures  . XR Lumbar Spine 2-3 Views   Meds ordered this encounter  Medications  . HYDROcodone-acetaminophen (NORCO) 7.5-325 MG tablet    Sig: Take 1 tablet by mouth 2 (two) times daily as needed for moderate pain.    Dispense:  10 tablet    Refill:  0  . predniSONE (STERAPRED UNI-PAK 21 TAB) 10 MG (21) TBPK tablet    Sig: Take as directed    Dispense:  21 tablet    Refill:  0  . methocarbamol (ROBAXIN) 500 MG tablet    Sig: Take 1 tablet (500 mg total) by mouth 2 (two) times daily as needed for muscle spasms.    Dispense:  20 tablet    Refill:  0      Procedures: No procedures performed   Clinical Data: No additional findings.   Subjective: Chief Complaint  Patient presents with  . Lower Back - Pain    HPI patient is a pleasant 77 year old gentleman who presents to our clinic today with left-sided lumbar radiculopathy.  This is been ongoing for the past week and a half without any known injury or change in activity.  The pain he has is to the left lower back radiating  down to the mid shin.  He was started on a 5 mg prednisone taper over a week ago which did seem to dull the pain while on the medicine.  His pain has returned and has started to worsen.  Pain is worse when he is standing or walking.  He does seem to get some comfort when he gets into the right position in his recliner.  No numbness, tingling or burning.  No bowel or bladder change and no saddle paresthesias.  He does note that he had a similar pain about 5 years ago and was seen by Dr. Sherrian Divers.  He was initially placed on a low-dose steroid taper prior to seeing Dr. Sherrian Divers which did not seem to help.  Dr. Sherrian Divers then gave him a higher dose steroid and his pain resolved.  Review of Systems as detailed in HPI.  All others reviewed and are negative.   Objective: Vital Signs: There were no vitals taken for this visit.  Physical Exam well-developed and well-nourished gentleman in no acute distress.  He does appear to be fairly uncomfortable.  Alert and oriented x3.  Ortho Exam examination of his lumbar spine reveals no  spinous or paraspinous tenderness.  Minimally positive straight leg raise.  4-1/2 out of 5 strength with resisted hip flexion and straight leg raise.  No focal weakness.  He is neurovascularly intact distally.  Specialty Comments:  No specialty comments available.  Imaging: Xr Lumbar Spine 2-3 Views  Result Date: 12/24/2017 X-rays of the lumbar spine reveal abnormal straightening.  He does appear to have a retrolisthesis at L5 with significant degenerative disc disease L5-S1.    PMFS History: Patient Active Problem List   Diagnosis Date Noted  . 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 Sinus bradycardia   . Prostate cancer ([place])   . Peripheral vascular disease ([place])   . Paroxysmal atrial fibrillation (HCC)   . Kidney disease   . HLD (hyperlipidemia)   . GERD (gastroesophageal reflux disease)   . Diverticulosis   . Chronic renal failure   . Bright red rectal bleeding   . Anal fissure     . PAF (paroxysmal atrial fibrillation) ([place]) 11/28/2016  . Near syncope 11/28/2016  . Palpitations 10/28/2016  . Transient cerebral ischemia 10/28/2016  . Mixed hyperlipidemia 10/28/2016  . C2 cervical fracture ([place]) 06/24/2014  . HTN (hypertension) 06/05/2011  . H/O prostate cancer 06/05/2011  . 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 Hemorrhage of rectum and anus 05/09/2011   Past Medical History:  Diagnosis Date  . Anal fissure    ?  . Bright red rectal bleeding   . C2 cervical fracture ([place]) 06/24/2014  . Chronic renal failure   . Diverticulosis   . GERD (gastroesophageal reflux disease)   . H/O prostate cancer 06/05/2011   S/p radiation rx 2010   . Hemorrhage of rectum and anus 05/09/2011  . HLD (hyperlipidemia)   . HTN (hypertension)   . Kidney disease    acute glomeral nepritis  . Paroxysmal atrial fibrillation (HCC)   . Peripheral vascular disease ([place])    carotid artery stent ( right  )  . Prostate cancer ([place])   . 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 Sinus bradycardia     Family History  Problem Relation Age of Onset  . Brain cancer Brother   . Atrial fibrillation Brother   . Colon cancer Neg Hx     Past Surgical History:  Procedure Laterality Date  . CAROTID STENT    . external beam radiation to prostate  2009  . FLEXIBLE SIGMOIDOSCOPY  05/22/2011   Procedure: FLEXIBLE SIGMOIDOSCOPY;  Surgeon: [name], MD;  Location: [facility];  Service: Endoscopy;  Laterality: N/A;  . HOT HEMOSTASIS  05/22/2011   Procedure: HOT HEMOSTASIS (ARGON PLASMA COAGULATION/BICAP);  Surgeon: [name], MD;  Location: [facility];  Service: Endoscopy;  Laterality: N/A;   Social History   Occupational History  . Occupation: pharmacist  Tobacco Use  . Smoking status: Former Smoker    Types: Cigarettes  . Smokeless tobacco: Never Used  Substance and Sexual Activity  . Alcohol use: Yes    Comment: occas  . Drug use: No  . Sexual activity: Not on file

## 2017-12-24 NOTE — Addendum Note (Signed)
Addended by: Precious Bard on: 12/24/2017 03:43 PM   Modules accepted: Orders

## 2017-12-25 ENCOUNTER — Telehealth (INDEPENDENT_AMBULATORY_CARE_PROVIDER_SITE_OTHER): Payer: Self-pay | Admitting: Orthopaedic Surgery

## 2017-12-25 NOTE — Telephone Encounter (Signed)
error 

## 2017-12-29 ENCOUNTER — Encounter (INDEPENDENT_AMBULATORY_CARE_PROVIDER_SITE_OTHER): Payer: Self-pay | Admitting: Family Medicine

## 2017-12-29 NOTE — Telephone Encounter (Signed)
I have already spoken with pt wife

## 2017-12-31 ENCOUNTER — Telehealth (INDEPENDENT_AMBULATORY_CARE_PROVIDER_SITE_OTHER): Payer: Self-pay

## 2017-12-31 NOTE — Telephone Encounter (Signed)
Pt's wife Timothy [name] called and lm on triage vm. States that the pt is in terrible pain and no one has called to schedule MRI yet. She states that the pt has a follow up appt with Dr. Junius Roads on 01/14/18 and that he can not wait that long. The pred taper has caused him to be violently ill and that she does not know what to do. She is asking if we can call and find out what is going on with the MRI or if he needs o be worked in to see Dr. Junius Roads sooner.

## 2018-01-01 ENCOUNTER — Other Ambulatory Visit (INDEPENDENT_AMBULATORY_CARE_PROVIDER_SITE_OTHER): Payer: Self-pay | Admitting: Family Medicine

## 2018-01-01 MED ORDER — BACLOFEN 10 MG PO TABS: 10.0000 mg | ORAL_TABLET | Freq: Three times a day (TID) | ORAL | 1 refills | Status: DC | PRN

## 2018-01-01 MED ORDER — HYDROCODONE-ACETAMINOPHEN 7.5-325 MG PO TABS: 1.0000 | ORAL_TABLET | Freq: Two times a day (BID) | ORAL | 0 refills | Status: DC | PRN

## 2018-01-01 MED ORDER — OXYCODONE-ACETAMINOPHEN 7.5-325 MG PO TABS: 1.0000 | ORAL_TABLET | ORAL | 0 refills | Status: DC | PRN

## 2018-01-01 NOTE — Telephone Encounter (Signed)
Percocet Rx sent

## 2018-01-01 NOTE — Telephone Encounter (Signed)
Spoke with the patient's wife - he has been taking the Hydrocodone and the muscle relaxers for the pain, but they are not giving much relief.  He had an episode yesterday, experiencing chills/hot then cold, clamminess, and BP was 105/48 after taking the hydrocodone (but it was on an empty stomach).  He has been taking an old Rx of Oxycodone ?5mg  IR as well - relief for only 30 minutes.  He is having difficulty stretching out in bed so sits in the recliner most of the day and night.  She said he currently even has issues with standing fully upright today due to pain. He finished the prednisone pack, which caused GI distress - was taking handfuls of TUMS for this - but that is getting better.  He is not interested in trying any PT or chiropractic care until he knows what the MRI shows - will be having the MRI tomorrow at noon at [facility].  He would like a refill of the muscle relaxer, but would like something different for pain. The wife is requesting a sooner appointment than 01/14/18 for followup.  Please advise on the medications.

## 2018-01-01 NOTE — Telephone Encounter (Signed)
Please advise 

## 2018-01-01 NOTE — Telephone Encounter (Signed)
I spoke with both the patient and his wife: Percocet and Baclofen have been sent in to [facility] on [address] - the Oxycodone in this Rx is a bit stronger (he was taking 5 - 325 before).  Scheduled an appointment for him to come in on Monday 01/04/18 for followup with Dr. Junius Roads.

## 2018-01-01 NOTE — Telephone Encounter (Signed)
Will call in a refill of pain med, and a muscle relaxant.  Might be helpful to go to physical therapy, or possibly a chiropractor for gentle treatments to help calm the pain.  If desired, I will give recommendations and request referral.

## 2018-01-02 ENCOUNTER — Encounter (HOSPITAL_COMMUNITY): Payer: Self-pay

## 2018-01-02 ENCOUNTER — Inpatient Hospital Stay (HOSPITAL_COMMUNITY)
Admission: EM | Admit: 2018-01-02 | Discharge: 2018-01-07 | DRG: 378 | Disposition: A | Discharge status: Home / Self Care | Payer: Medicare Other | Attending: Internal Medicine | Admitting: Internal Medicine

## 2018-01-02 ENCOUNTER — Other Ambulatory Visit: Payer: Self-pay

## 2018-01-02 ENCOUNTER — Emergency Department (HOSPITAL_COMMUNITY): Payer: Medicare Other

## 2018-01-02 ENCOUNTER — Ambulatory Visit
Admission: RE | Admit: 2018-01-02 | Discharge: 2018-01-02 | Disposition: A | Discharge status: Home / Self Care | Payer: Medicare Other | Source: Ambulatory Visit | Attending: Physician Assistant | Admitting: Physician Assistant

## 2018-01-02 DIAGNOSIS — M5116 Intervertebral disc disorders with radiculopathy, lumbar region: Secondary | ICD-10-CM | POA: Diagnosis present

## 2018-01-02 DIAGNOSIS — G8929 Other chronic pain: Secondary | ICD-10-CM

## 2018-01-02 DIAGNOSIS — M549 Dorsalgia, unspecified: Secondary | ICD-10-CM | POA: Diagnosis not present

## 2018-01-02 DIAGNOSIS — M5416 Radiculopathy, lumbar region: Secondary | ICD-10-CM

## 2018-01-02 DIAGNOSIS — Z87891 Personal history of nicotine dependence: Secondary | ICD-10-CM

## 2018-01-02 DIAGNOSIS — M545 Low back pain, unspecified: Secondary | ICD-10-CM

## 2018-01-02 DIAGNOSIS — K922 Gastrointestinal hemorrhage, unspecified: Secondary | ICD-10-CM | POA: Diagnosis not present

## 2018-01-02 DIAGNOSIS — R112 Nausea with vomiting, unspecified: Secondary | ICD-10-CM

## 2018-01-02 DIAGNOSIS — Z8546 Personal history of malignant neoplasm of prostate: Secondary | ICD-10-CM

## 2018-01-02 DIAGNOSIS — D696 Thrombocytopenia, unspecified: Secondary | ICD-10-CM | POA: Diagnosis present

## 2018-01-02 DIAGNOSIS — I480 Paroxysmal atrial fibrillation: Secondary | ICD-10-CM | POA: Diagnosis not present

## 2018-01-02 DIAGNOSIS — R110 Nausea: Secondary | ICD-10-CM | POA: Diagnosis not present

## 2018-01-02 DIAGNOSIS — N183 Chronic kidney disease, stage 3 (moderate): Secondary | ICD-10-CM | POA: Diagnosis present

## 2018-01-02 DIAGNOSIS — I959 Hypotension, unspecified: Secondary | ICD-10-CM | POA: Diagnosis not present

## 2018-01-02 DIAGNOSIS — Z7901 Long term (current) use of anticoagulants: Secondary | ICD-10-CM

## 2018-01-02 DIAGNOSIS — M109 Gout, unspecified: Secondary | ICD-10-CM

## 2018-01-02 DIAGNOSIS — I129 Hypertensive chronic kidney disease with stage 1 through stage 4 chronic kidney disease, or unspecified chronic kidney disease: Secondary | ICD-10-CM | POA: Diagnosis present

## 2018-01-02 DIAGNOSIS — I739 Peripheral vascular disease, unspecified: Secondary | ICD-10-CM | POA: Diagnosis present

## 2018-01-02 DIAGNOSIS — I491 Atrial premature depolarization: Secondary | ICD-10-CM | POA: Diagnosis not present

## 2018-01-02 DIAGNOSIS — Z923 Personal history of irradiation: Secondary | ICD-10-CM

## 2018-01-02 DIAGNOSIS — K920 Hematemesis: Secondary | ICD-10-CM | POA: Diagnosis not present

## 2018-01-02 DIAGNOSIS — Z808 Family history of malignant neoplasm of other organs or systems: Secondary | ICD-10-CM

## 2018-01-02 DIAGNOSIS — K219 Gastro-esophageal reflux disease without esophagitis: Secondary | ICD-10-CM | POA: Diagnosis not present

## 2018-01-02 DIAGNOSIS — E782 Mixed hyperlipidemia: Secondary | ICD-10-CM | POA: Diagnosis present

## 2018-01-02 DIAGNOSIS — M47816 Spondylosis without myelopathy or radiculopathy, lumbar region: Secondary | ICD-10-CM | POA: Diagnosis not present

## 2018-01-02 DIAGNOSIS — R1111 Vomiting without nausea: Secondary | ICD-10-CM | POA: Diagnosis not present

## 2018-01-02 DIAGNOSIS — D62 Acute posthemorrhagic anemia: Secondary | ICD-10-CM | POA: Diagnosis present

## 2018-01-02 DIAGNOSIS — N179 Acute kidney failure, unspecified: Secondary | ICD-10-CM | POA: Diagnosis not present

## 2018-01-02 DIAGNOSIS — E860 Dehydration: Secondary | ICD-10-CM | POA: Diagnosis present

## 2018-01-02 DIAGNOSIS — M5417 Radiculopathy, lumbosacral region: Secondary | ICD-10-CM

## 2018-01-02 DIAGNOSIS — E785 Hyperlipidemia, unspecified: Secondary | ICD-10-CM | POA: Diagnosis present

## 2018-01-02 DIAGNOSIS — E871 Hypo-osmolality and hyponatremia: Secondary | ICD-10-CM | POA: Diagnosis not present

## 2018-01-02 DIAGNOSIS — D72829 Elevated white blood cell count, unspecified: Secondary | ICD-10-CM | POA: Diagnosis present

## 2018-01-02 DIAGNOSIS — R000 Tachycardia, unspecified: Secondary | ICD-10-CM | POA: Diagnosis not present

## 2018-01-02 DIAGNOSIS — K317 Polyp of stomach and duodenum: Secondary | ICD-10-CM

## 2018-01-02 LAB — CBC
HCT: 40.2 % (ref 39.0–52.0)
HCT: 40.3 % (ref 39.0–52.0)
HEMOGLOBIN: 13.3 g/dL (ref 13.0–17.0)
Hemoglobin: 13.4 g/dL (ref 13.0–17.0)
MCH: 33.5 pg (ref 26.0–34.0)
MCH: 33.5 pg (ref 26.0–34.0)
MCHC: 33.3 g/dL (ref 30.0–36.0)
MCHC: 33 g/dL (ref 30.0–36.0)
MCV: 100.5 fL — ABNORMAL HIGH (ref 80.0–100.0)
MCV: 101.5 fL — ABNORMAL HIGH (ref 80.0–100.0)
Platelets: 153 10*3/uL (ref 150–400)
Platelets: 166 10*3/uL (ref 150–400)
RBC: 4 MIL/uL — ABNORMAL LOW (ref 4.22–5.81)
RBC: 3.97 MIL/uL — AB (ref 4.22–5.81)
RDW: 15.5 % (ref 11.5–15.5)
RDW: 15.8 % — ABNORMAL HIGH (ref 11.5–15.5)
WBC: 12.2 10*3/uL — ABNORMAL HIGH (ref 4.0–10.5)
WBC: 11.2 10*3/uL — AB (ref 4.0–10.5)
nRBC: 0 % (ref 0.0–0.2)
nRBC: 0 % (ref 0.0–0.2)

## 2018-01-02 LAB — COMPREHENSIVE METABOLIC PANEL
ALBUMIN: 3.8 g/dL (ref 3.5–5.0)
ALT: 15 U/L (ref 0–44)
ANION GAP: 10 (ref 5–15)
AST: 14 U/L — ABNORMAL LOW (ref 15–41)
Alkaline Phosphatase: 67 U/L (ref 38–126)
BILIRUBIN TOTAL: 0.9 mg/dL (ref 0.3–1.2)
BUN: 75 mg/dL — ABNORMAL HIGH (ref 8–23)
CO2: 26 mmol/L (ref 22–32)
Calcium: 9.5 mg/dL (ref 8.9–10.3)
Chloride: 98 mmol/L (ref 98–111)
Creatinine, Ser: 2.13 mg/dL — ABNORMAL HIGH (ref 0.61–1.24)
GFR calc Af Amer: 33 mL/min — ABNORMAL LOW (ref >60)
GFR calc non Af Amer: 29 mL/min — ABNORMAL LOW (ref >60)
GLUCOSE: 126 mg/dL — AB (ref 70–99)
POTASSIUM: 4.2 mmol/L (ref 3.5–5.1)
Sodium: 134 mmol/L — ABNORMAL LOW (ref 135–145)
TOTAL PROTEIN: 7.4 g/dL (ref 6.5–8.1)

## 2018-01-02 LAB — I-STAT TROPONIN, ED: Troponin i, poc: 0 ng/mL (ref 0.00–0.08)

## 2018-01-02 LAB — POC OCCULT BLOOD, ED: Fecal Occult Bld: POSITIVE — AB

## 2018-01-02 LAB — TYPE AND SCREEN
ABO/RH(D): A POS
ANTIBODY SCREEN: NEGATIVE

## 2018-01-02 LAB — ABO/RH: ABO/RH(D): A POS

## 2018-01-02 LAB — PROTIME-INR
INR: 1.21
Prothrombin Time: 15.2 seconds (ref 11.4–15.2)

## 2018-01-02 MED ORDER — SODIUM CHLORIDE 0.9 % IV SOLN
INTRAVENOUS | Status: AC
  Administered 2018-01-02 – 2018-01-03 (×2): via INTRAVENOUS

## 2018-01-02 MED ORDER — PANTOPRAZOLE SODIUM 40 MG IV SOLR
40.0000 mg | Freq: Once | INTRAVENOUS | Status: AC
  Administered 2018-01-02: 40 mg via INTRAVENOUS
  Filled 2018-01-02: qty 40

## 2018-01-02 MED ORDER — PANTOPRAZOLE SODIUM 40 MG IV SOLR
40.0000 mg | Freq: Two times a day (BID) | INTRAVENOUS | Status: DC
  Administered 2018-01-02 – 2018-01-06 (×8): 40 mg via INTRAVENOUS
  Filled 2018-01-02 (×8): qty 40

## 2018-01-02 MED ORDER — LORAZEPAM 2 MG/ML IJ SOLN
1.0000 mg | Freq: Once | INTRAMUSCULAR | Status: AC
  Administered 2018-01-02: 1 mg via INTRAVENOUS
  Filled 2018-01-02: qty 1

## 2018-01-02 MED ORDER — MORPHINE SULFATE (PF) 4 MG/ML IV SOLN
4.0000 mg | Freq: Once | INTRAVENOUS | Status: AC
  Administered 2018-01-02: 4 mg via INTRAVENOUS
  Filled 2018-01-02: qty 1

## 2018-01-02 MED ORDER — SODIUM CHLORIDE 0.9 % IV BOLUS
1000.0000 mL | Freq: Once | INTRAVENOUS | Status: AC
  Administered 2018-01-02: 1000 mL via INTRAVENOUS

## 2018-01-02 MED ORDER — ONDANSETRON HCL 4 MG/2ML IJ SOLN
4.0000 mg | Freq: Once | INTRAMUSCULAR | Status: AC
  Administered 2018-01-02: 4 mg via INTRAVENOUS
  Filled 2018-01-02: qty 2

## 2018-01-02 MED ORDER — MORPHINE SULFATE (PF) 2 MG/ML IV SOLN
1.0000 mg | INTRAVENOUS | Status: DC | PRN
  Administered 2018-01-02 – 2018-01-03 (×4): 1 mg via INTRAVENOUS
  Filled 2018-01-02 (×4): qty 1

## 2018-01-02 MED ORDER — ONDANSETRON HCL 4 MG/2ML IJ SOLN
4.0000 mg | Freq: Four times a day (QID) | INTRAMUSCULAR | Status: DC | PRN
  Administered 2018-01-03 – 2018-01-04 (×2): 4 mg via INTRAVENOUS
  Filled 2018-01-02 (×3): qty 2

## 2018-01-02 NOTE — ED Notes (Signed)
ED TO INPATIENT HANDOFF REPORT  Name/Age/Gender Timothy [name] 77 y.o. male  Code Status Code Status History    Date Active Date Inactive Code Status Order ID Comments User Context   06/24/2014 0843 06/27/2014 1716 Full Code [id number]  Ashok Pall, MD ED      Home/SNF/Other Home  Chief Complaint gi bleed, coffee grounds emesis  Level of Care/Admitting Diagnosis ED Disposition    ED Disposition Condition [facility]: [facility] [[id number]]  Level of Care: Telemetry [5]  Admit to tele based on following criteria: Other see comments  Comments: GI bleed  Diagnosis: Upper GI bleed [914782]  Admitting Physician: Shela Leff [[id number]]  Attending Physician: Shela Leff [[id number]]  PT Class (Do Not Modify): Observation [104]  PT Acc Code (Do Not Modify): Observation [10022]       Medical History Past Medical History:  Diagnosis Date  . Anal fissure    ?  . Bright red rectal bleeding   . C2 cervical fracture ([place]) 06/24/2014  . Chronic renal failure   . Diverticulosis   . GERD (gastroesophageal reflux disease)   . H/O prostate cancer 06/05/2011   S/p radiation rx 2010   . Hemorrhage of rectum and anus 05/09/2011  . HLD (hyperlipidemia)   . HTN (hypertension)   . Kidney disease    acute glomeral nepritis  . Paroxysmal atrial fibrillation (HCC)   . Peripheral vascular disease ([place])    carotid artery stent ( right  )  . Prostate cancer ([place])   . 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 Sinus bradycardia     Allergies No Known Allergies  IV Location/Drains/Wounds Patient Lines/Drains/Airways Status   Active Line/Drains/Airways    Name:   Placement date:   Placement time:   Site:   Days:   Peripheral IV 01/02/18 Left Antecubital   01/02/18    1300    Antecubital   less than 1          Labs/Imaging Results for orders placed or performed during the hospital encounter of 01/02/18 (from the past 48 hour(s))   Comprehensive metabolic panel     Status: Abnormal   Collection Time: 01/02/18  2:01 PM  Result Value Ref Range   Sodium 134 (L) 135 - 145 mmol/L   Potassium 4.2 3.5 - 5.1 mmol/L   Chloride 98 98 - 111 mmol/L   CO2 26 22 - 32 mmol/L   Glucose, Bld 126 (H) 70 - 99 mg/dL   BUN 75 (H) 8 - 23 mg/dL   Creatinine, Ser 2.13 (H) 0.61 - 1.24 mg/dL   Calcium 9.5 8.9 - 10.3 mg/dL   Total Protein 7.4 6.5 - 8.1 g/dL   Albumin 3.8 3.5 - 5.0 g/dL   AST 14 (L) 15 - 41 U/L   ALT 15 0 - 44 U/L   Alkaline Phosphatase 67 38 - 126 U/L   Total Bilirubin 0.9 0.3 - 1.2 mg/dL   GFR calc non Af Amer 29 (L) >60 mL/min   GFR calc Af Amer 33 (L) >60 mL/min    Comment: (NOTE) The eGFR has been calculated using the CKD EPI equation. This calculation has not been validated in all clinical situations. eGFR's persistently <60 mL/min signify possible Chronic Kidney Disease.    Anion gap 10 5 - 15    Comment: Performed at [facility], [place] [address], [place], [place] [address]  CBC     Status: Abnormal   Collection  Time: 01/02/18  2:01 PM  Result Value Ref Range   WBC 12.2 (H) 4.0 - 10.5 K/uL   RBC 4.00 (L) 4.22 - 5.81 MIL/uL   Hemoglobin 13.4 13.0 - 17.0 g/dL   HCT 40.2 39.0 - 52.0 %   MCV 100.5 (H) 80.0 - 100.0 fL   MCH 33.5 26.0 - 34.0 pg   MCHC 33.3 30.0 - 36.0 g/dL   RDW 15.5 11.5 - 15.5 %   Platelets 153 150 - 400 K/uL   nRBC 0.0 0.0 - 0.2 %    Comment: Performed at [facility], [place] [address] [place], [place] [address]  Type and screen Atlas     Status: None   Collection Time: 01/02/18  2:01 PM  Result Value Ref Range   ABO/RH(D) A POS    Antibody Screen NEG    Sample Expiration      01/05/2018 Performed at [facility], [place] [address], [place], [place] [address]   Protime-INR - (order if Patient is taking Coumadin / Warfarin)     Status: None   Collection Time: 01/02/18  2:01 PM  Result Value Ref Range    Prothrombin Time 15.2 11.4 - 15.2 seconds   INR 1.21     Comment: Performed at [facility], [place] [address], [place], [place] [address]  I-Stat Troponin, ED (not at [facility])     Status: None   Collection Time: 01/02/18  2:05 PM  Result Value Ref Range   Troponin i, poc 0.00 0.00 - 0.08 ng/mL   Comment 3            Comment: Due to the release kinetics of cTnI, a negative result within the first hours of the onset of symptoms does not rule out myocardial infarction with certainty. If myocardial infarction is still suspected, repeat the test at appropriate intervals.   POC occult blood, ED     Status: Abnormal   Collection Time: 01/02/18  2:07 PM  Result Value Ref Range   Fecal Occult Bld POSITIVE (A) NEGATIVE  CBC     Status: Abnormal   Collection Time: 01/02/18  5:01 PM  Result Value Ref Range   WBC 11.2 (H) 4.0 - 10.5 K/uL   RBC 3.97 (L) 4.22 - 5.81 MIL/uL   Hemoglobin 13.3 13.0 - 17.0 g/dL   HCT 40.3 39.0 - 52.0 %   MCV 101.5 (H) 80.0 - 100.0 fL   MCH 33.5 26.0 - 34.0 pg   MCHC 33.0 30.0 - 36.0 g/dL   RDW 15.8 (H) 11.5 - 15.5 %   Platelets 166 150 - 400 K/uL   nRBC 0.0 0.0 - 0.2 %    Comment: Performed at [facility], [place] [address], [place], [place] [address]   Mr Lumbar Spine Wo Contrast  Result Date: 01/02/2018 CLINICAL DATA:  Back pain radiating to left leg. EXAM: MRI LUMBAR SPINE WITHOUT CONTRAST TECHNIQUE: Multiplanar, multisequence MR imaging of the lumbar spine was performed. No intravenous contrast was administered. COMPARISON:  Lumbar spine radiograph 12/24/2017 FINDINGS: The examination was discontinued before completion. There is grade 1 anterolisthesis at L4-L5. The examination otherwise shows normal alignment without evidence of compression fracture. IMPRESSION: Grade 1 anterolisthesis at L4-L5. Otherwise, discontinued examination with limited diagnostic information. Electronically Signed   By: Ulyses Jarred M.D.   On: 01/02/2018  18:18   None  Pending Labs Unresulted Labs (From admission, onward)    Start  Ordered   01/02/18 1401  ABO/Rh  Once,   R     01/02/18 1401          Vitals/Pain Today's Vitals   01/02/18 1805 01/02/18 1808 01/02/18 1815 01/02/18 1830  BP: (!) 134/59   (!) 149/58  Pulse:  65  72  Resp: 16     SpO2: 94%   97%  PainSc:   2      Isolation Precautions No active isolations  Medications Medications  morphine 4 MG/ML injection 4 mg (4 mg Intravenous Given 01/02/18 1412)  pantoprazole (PROTONIX) injection 40 mg (40 mg Intravenous Given 01/02/18 1524)  LORazepam (ATIVAN) injection 1 mg (1 mg Intravenous Given 01/02/18 1709)  morphine 4 MG/ML injection 4 mg (4 mg Intravenous Given 01/02/18 1645)  ondansetron (ZOFRAN) injection 4 mg (4 mg Intravenous Given 01/02/18 1645)  sodium chloride 0.9 % bolus 1,000 mL (0 mLs Intravenous Stopped 01/02/18 1822)    Mobility walks with person assist

## 2018-01-02 NOTE — ED Triage Notes (Signed)
He had gone to [facility] to undergo tests regarding the elucidation of his chronic back pain/sciatica. While at the imaging center, he experienced four episodes of "black, coffee ground emesis". He arrives in our department in no distress. He tells [place] he is on Eliquis for A-fib. His wife volunteers that pt. Has recently "been taking a lot of TUMs".

## 2018-01-02 NOTE — ED Provider Notes (Signed)
Briefly, patient is a 77 year old male here with back pain as well as episodes of hematemesis and nausea throughout the day.  Patient has recently been on a prednisone taper for his back pain and was trying to obtain an MRI today when he became nauseous and began vomiting bloody emesis.  He is on Eliquis.  Current plan is to obtain MRI, monitor, and reassess.  On reassessment, patient endorses persistent, severe epigastric pain. I suspect gastritis/PUD 2/2 prednisone course, chronic gastritis. D/w [facility] - will start on IV PPI BID, NPO, fluids, admit. MRI pending.   Duffy Bruce, MD 01/02/18 [phone]

## 2018-01-02 NOTE — H&P (Signed)
History and Physical    Timothy [name]:[id number] DOB: 02/21/1941 DOA: 01/02/2018  PCP: Crist Infante, MD  Chief Complaint: Coffee-ground emesis, back pain  HPI: Timothy [name] is a 77 y.o. male with medical history significant of GERD hypertension, hyperlipidemia, paroxysmal A. fib on Eliquis presenting to the hospital with back pain as well as episodes of coffee-ground emesis throughout the day.  Patient was somnolent secondary to receiving a dose of benzodiazepine for MRI.  History provided by wife at bedside.  Wife states patient has been complaining of severe lumbar back pain for the past 2 weeks.  He was seen at orthopedic office on October 31 and plan was to do an MRI today.  Patient takes Eliquis for A. fib and was recently treated with 2 rounds of prednisone taper for his back pain.  States today he woke up nauseous and has vomited 4 times throughout the day.  Wife describes it as large volume "dark, black, grainy" vomit.  States he was unable to get his MRI done outpatient today due to vomiting. States he has not been eating much for the last few days secondary to his back pain.  Patient denies having any abdominal pain.  Denies having any weakness in his legs.  Denies having any bowel/bladder incontinence or saddle anesthesia.  Denies having any cough or shortness of breath.  Denies having any dysuria, urinary frequency, or urgency.  ED Course: Hemodynamically stable.  Hemoglobin 13.4 initially and repeat 13.3.  FOBT positive.  Liver function normal.  INR 1.2.  Creatinine 2.1, no recent baseline.  Last creatinine checked 3 years ago was 1.6.  I-STAT troponin negative. ED physician informed me that Ortho saw the patient in the ED and recommended a repeat MRI for evaluation of his lower back pain.  Patient was taken for an MRI here but was unable to lay down for the study due to back pain; ED physician will update Ortho.  He also discussed the case with [facility] who recommended starting IV  PPI twice daily, n.p.o., fluids admit.  MRI pending.  TRH paged to admit.  Review of Systems: As per HPI otherwise 10 point review of systems negative.  Past Medical History:  Diagnosis Date  . Anal fissure    ?  . Bright red rectal bleeding   . C2 cervical fracture ([place]) 06/24/2014  . Chronic renal failure   . Diverticulosis   . GERD (gastroesophageal reflux disease)   . H/O prostate cancer 06/05/2011   S/p radiation rx 2010   . Hemorrhage of rectum and anus 05/09/2011  . HLD (hyperlipidemia)   . HTN (hypertension)   . Kidney disease    acute glomeral nepritis  . Paroxysmal atrial fibrillation (HCC)   . Peripheral vascular disease ([place])    carotid artery stent ( right  )  . Prostate cancer ([place])   . 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 Sinus bradycardia     Past Surgical History:  Procedure Laterality Date  . CAROTID STENT    . external beam radiation to prostate  2009  . FLEXIBLE SIGMOIDOSCOPY  05/22/2011   Procedure: FLEXIBLE SIGMOIDOSCOPY;  Surgeon: [name], MD;  Location: [facility];  Service: Endoscopy;  Laterality: N/A;  . HOT HEMOSTASIS  05/22/2011   Procedure: HOT HEMOSTASIS (ARGON PLASMA COAGULATION/BICAP);  Surgeon: [name], MD;  Location: [facility];  Service: Endoscopy;  Laterality: N/A;     reports that he has quit smoking. His smoking use included cigarettes. He  has never used smokeless tobacco. He reports that he drinks alcohol. He reports that he does not use drugs.  No Known Allergies  Family History  Problem Relation Age of Onset  . Brain cancer Brother   . Atrial fibrillation Brother   . Colon cancer Neg Hx     Prior to Admission medications   Medication Sig Start Date End Date Taking? Authorizing Provider  allopurinol (ZYLOPRIM) 300 MG tablet Take 300 mg by mouth daily.    Yes [provider]  amLODipine (NORVASC) 10 MG tablet Take 10 mg by mouth daily.   Yes [provider]  apixaban (ELIQUIS) 5 MG TABS  tablet Take 1 tablet (5 mg total) by mouth 2 (two) times daily. 03/02/17  Yes Nahser, Wonda Cheng, MD  baclofen (LIORESAL) 10 MG tablet Take 1 tablet (10 mg total) by mouth 3 (three) times daily as needed for muscle spasms. 01/01/18  Yes Hilts, Legrand Como, MD  ezetimibe (ZETIA) 10 MG tablet Take 10 mg by mouth daily. 12/10/17  Yes [provider]  furosemide (LASIX) 40 MG tablet Take 80 mg by mouth daily.    Yes [provider]  omeprazole (PRILOSEC) 20 MG capsule Take 20 mg by mouth daily.   Yes [provider]  oxyCODONE-acetaminophen (PERCOCET) 7.5-325 MG tablet Take 1 tablet by mouth every 4 (four) hours as needed for severe pain. 01/01/18  Yes Hilts, Legrand Como, MD  telmisartan (MICARDIS) 80 MG tablet Take 80 mg by mouth daily.   Yes [provider]    Physical Exam: Vitals:   01/02/18 1526 01/02/18 1805 01/02/18 1808 01/02/18 1830  BP: (!) 156/58 (!) 134/59  (!) 149/58  Pulse: 69  65 72  Resp: 16 16    SpO2: 100% 94%  97%    Physical Exam  Constitutional: No distress.  Sleeping comfortably in a hospital stretcher  HENT:  Head: Normocephalic.  Mouth/Throat: Oropharynx is clear and moist.  Eyes: Right eye exhibits no discharge. Left eye exhibits no discharge.  Neck: Neck supple. No tracheal deviation present.  Cardiovascular: Normal rate, regular rhythm and intact distal pulses.  Pulmonary/Chest: Effort normal and breath sounds normal. No respiratory distress. He has no wheezes. He has no rales.  Abdominal: Soft. Bowel sounds are normal. He exhibits no distension. There is no tenderness. There is no guarding.  Musculoskeletal: He exhibits no edema.  Strength 5 out of 5 and sensation to light touch intact in bilateral lower extremities  Neurological:  Somnolent but wakes up and answers questions appropriately.  He is not disoriented.  Skin: Skin is warm and dry. He is not diaphoretic.     Labs on Admission: I have personally reviewed following labs and  imaging studies  CBC: Recent Labs  Lab 01/02/18 1401 01/02/18 1701  WBC 12.2* 11.2*  HGB 13.4 13.3  HCT 40.2 40.3  MCV 100.5* 101.5*  PLT 153 606   Basic Metabolic Panel: Recent Labs  Lab 01/02/18 1401  NA 134*  K 4.2  CL 98  CO2 26  GLUCOSE 126*  BUN 75*  CREATININE 2.13*  CALCIUM 9.5   GFR: CrCl cannot be calculated (Unknown ideal weight.). Liver Function Tests: Recent Labs  Lab 01/02/18 1401  AST 14*  ALT 15  ALKPHOS 67  BILITOT 0.9  PROT 7.4  ALBUMIN 3.8   No results for input(s): LIPASE, AMYLASE in the last 168 hours. No results for input(s): AMMONIA in the last 168 hours. Coagulation Profile: Recent Labs  Lab 01/02/18 1401  INR 1.21   Cardiac Enzymes: No results for input(s): CKTOTAL, CKMB, CKMBINDEX, TROPONINI in the last 168 hours. BNP (last 3 results) No results for input(s): PROBNP in the last 8760 hours. HbA1C: No results for input(s): HGBA1C in the last 72 hours. CBG: No results for input(s): GLUCAP in the last 168 hours. Lipid Profile: No results for input(s): CHOL, HDL, LDLCALC, TRIG, CHOLHDL, LDLDIRECT in the last 72 hours. Thyroid Function Tests: No results for input(s): TSH, T4TOTAL, FREET4, T3FREE, THYROIDAB in the last 72 hours. Anemia Panel: No results for input(s): VITAMINB12, FOLATE, FERRITIN, TIBC, IRON, RETICCTPCT in the last 72 hours. Urine analysis: No results found for: COLORURINE, APPEARANCEUR, LABSPEC, PHURINE, GLUCOSEU, HGBUR, BILIRUBINUR, [place], PROTEINUR, UROBILINOGEN, NITRITE, LEUKOCYTESUR  Radiological Exams on Admission: Mr Lumbar Spine Wo Contrast  Result Date: 01/02/2018 CLINICAL DATA:  Back pain radiating to left leg. EXAM: MRI LUMBAR SPINE WITHOUT CONTRAST TECHNIQUE: Multiplanar, multisequence MR imaging of the lumbar spine was performed. No intravenous contrast was administered. COMPARISON:  Lumbar spine radiograph 12/24/2017 FINDINGS: The examination was discontinued before completion. There is grade 1  anterolisthesis at L4-L5. The examination otherwise shows normal alignment without evidence of compression fracture. IMPRESSION: Grade 1 anterolisthesis at L4-L5. Otherwise, discontinued examination with limited diagnostic information. Electronically Signed   By: Ulyses Jarred M.D.   On: 01/02/2018 18:18    EKG: Pending at this time.  Assessment/Plan Principal Problem:   Upper GI bleed Active Problems:   Mixed hyperlipidemia   PAF (paroxysmal atrial fibrillation) (HCC)   HLD (hyperlipidemia)   GERD (gastroesophageal reflux disease)   Lumbar back pain   Coffee ground emesis   AKI (acute kidney injury) (HCC)   Leukocytosis   Gout   Suspected upper GI bleed, coffee-ground emesis Presenting with a 1 day history of coffee-ground emesis in the setting of home Eliquis use for A. fib and recent steroid tapers for lumbar back pain.  Patient is hemodynamically stable.  No further episodes of vomiting since he has been in the hospital.  He is not complaining of abdominal pain. FOBT positive.  Hemoglobin 13.4 initially and repeat 13.3.  ED physician discussed the case with [facility] who recommended starting IV PPI twice daily, n.p.o., fluids, admit. -Type and cross -IV fluid resuscitation -IV Protonix 40 mg twice daily -IV Zofran PRN nausea -Continue to monitor CBC -Keep n.p.o.  Lumbar back pain Followed by orthopedics on an outpatient basis.  Back pain has been worse for the past 2 weeks.  No red flags such as lower extremity weakness, bladder/bowel incontinence, or saddle anesthesia.  Patient unable to get outpatient MRI today due to vomiting. ED physician informed me that Ortho saw the patient in the ED and recommended a repeat MRI for evaluation of his lower back pain.  Patient was taken for an MRI here but was unable to lay down for the study due to back pain; ED physician will update Ortho.   -He will need MRI done after stabilization of his upper GI bleed. -Follow-up orthopedic  recommendations -IV morphine 1 mg every 3 hours as needed  ?AKI Creatinine 2.1, no recent baseline.  Last creatinine checked 3 years ago was 1.6.  Likely prerenal due to dehydration. -IV fluid resuscitation -Avoid nephrotoxic agents/contrast -Repeat BMP in a.m.  Mild leukocytosis Likely reactive.  White count 11.2.  Patient is afebrile.  No UTI symptoms endorsed.  No cough or shortness of breath to suggest pneumonia.  Satting well on room air.  Lungs clear on exam. -Continue to monitor  CBC  Paroxysmal A. Fib -CHA2DS2VASc 3.  -Hold Eliquis in the setting of suspected upper GI bleed. -He is currently not on a rate control agent.  Hypertension -Hold home amlodipine, telmisartan, and Lasix in the setting of suspected upper GI bleed.  Hyperlipidemia -Hold home Zetia at this time as patient is n.p.o.  GERD -IV PPI  Gout -Hold home allopurinol at this time as patient is n.p.o.  DVT prophylaxis: SCDs Code Status: Patient wishes to be full code. Family Communication: Wife at bedside.   Disposition Plan: Anticipate discharge to home in 1 to 2 days. Consults called: Orthopedics, [facility] Admission status: Observation   Shela Leff MD [facility] Pager [phone]  If 7PM-7AM, please contact night-coverage [email or web address] Password [email or web address]  01/02/2018, 7:14 PM

## 2018-01-02 NOTE — ED Provider Notes (Signed)
[facility] DEPT Provider Note   CSN: [id number] Arrival date & time: 01/02/18  1310     History   Chief Complaint Chief Complaint  Patient presents with  . Hematemesis    HPI Timothy [name] is a 77 y.o. male.  Patient is a 77 year old male with past medical history of paroxysmal atrial fibrillation on Eliquis, hypertension, and lumbar disc disease.  He presents today for evaluation of vomiting.  He was scheduled for an MRI this afternoon.  He was at the radiology department when his pain worsened and he developed nausea and vomiting.  He had multiple episodes of emesis of what was described as a dark brown material.  He was given Zofran by EMS and is now feeling much improved.  He was sent here by his orthopedic surgeon, Dr. Louanne Skye who would like an MRI of the lumbar spine to be obtained today if at all possible.  Patient denies any abdominal pain.  He denies any recent melena or tarry stool.  He denies any history of ulcer but does take Prilosec for reflux.  The history is provided by the patient.    Past Medical History:  Diagnosis Date  . Anal fissure    ?  . Bright red rectal bleeding   . C2 cervical fracture ([place]) 06/24/2014  . Chronic renal failure   . Diverticulosis   . GERD (gastroesophageal reflux disease)   . H/O prostate cancer 06/05/2011   S/p radiation rx 2010   . Hemorrhage of rectum and anus 05/09/2011  . HLD (hyperlipidemia)   . HTN (hypertension)   . Kidney disease    acute glomeral nepritis  . Paroxysmal atrial fibrillation (HCC)   . Peripheral vascular disease ([place])    carotid artery stent ( right  )  . Prostate cancer ([place])   . 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 Sinus bradycardia     Patient Active Problem List   Diagnosis Date Noted  . 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 Sinus bradycardia   . Prostate cancer ([place])   . Peripheral vascular disease ([place])   . Paroxysmal atrial fibrillation (HCC)   . Kidney  disease   . HLD (hyperlipidemia)   . GERD (gastroesophageal reflux disease)   . Diverticulosis   . Chronic renal failure   . Bright red rectal bleeding   . Anal fissure   . PAF (paroxysmal atrial fibrillation) ([place]) 11/28/2016  . Near syncope 11/28/2016  . Palpitations 10/28/2016  . Transient cerebral ischemia 10/28/2016  . Mixed hyperlipidemia 10/28/2016  . C2 cervical fracture ([place]) 06/24/2014  . HTN (hypertension) 06/05/2011  . H/O prostate cancer 06/05/2011  . 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 Hemorrhage of rectum and anus 05/09/2011    Past Surgical History:  Procedure Laterality Date  . CAROTID STENT    . external beam radiation to prostate  2009  . FLEXIBLE SIGMOIDOSCOPY  05/22/2011   Procedure: FLEXIBLE SIGMOIDOSCOPY;  Surgeon: [name], MD;  Location: [facility];  Service: Endoscopy;  Laterality: N/A;  . HOT HEMOSTASIS  05/22/2011   Procedure: HOT HEMOSTASIS (ARGON PLASMA COAGULATION/BICAP);  Surgeon: [name], MD;  Location: [facility];  Service: Endoscopy;  Laterality: N/A;        Home Medications    Prior to Admission medications   Medication Sig Start Date End Date Taking? Authorizing Provider  allopurinol (ZYLOPRIM) 300 MG tablet Take 300 mg by mouth daily.     [provider]  amLODipine ([place])  10 MG tablet Take 10 mg by mouth daily.    [provider]  apixaban (ELIQUIS) 5 MG TABS tablet Take 1 tablet (5 mg total) by mouth 2 (two) times daily. 03/02/17   Nahser, Wonda Cheng, MD  baclofen (LIORESAL) 10 MG tablet Take 1 tablet (10 mg total) by mouth 3 (three) times daily as needed for muscle spasms. 01/01/18   Hilts, Legrand Como, MD  furosemide (LASIX) 40 MG tablet Take 80 mg by mouth daily.     [provider]  HYDROcodone-acetaminophen (NORCO) 7.5-325 MG tablet Take 1 tablet by mouth 2 (two) times daily as needed for moderate pain. 01/01/18   Hilts, Legrand Como, MD  methocarbamol (ROBAXIN) 500 MG tablet Take 1 tablet  (500 mg total) by mouth 2 (two) times daily as needed for muscle spasms. 12/24/17   Aundra Dubin, PA-C  omeprazole (PRILOSEC) 20 MG capsule Take 20 mg by mouth daily.    [provider]  oxyCODONE-acetaminophen (PERCOCET) 7.5-325 MG tablet Take 1 tablet by mouth every 4 (four) hours as needed for severe pain. 01/01/18   Hilts, Legrand Como, MD  predniSONE (STERAPRED UNI-PAK 21 TAB) 10 MG (21) TBPK tablet Take as directed 12/24/17   Aundra Dubin, PA-C  rosuvastatin (CRESTOR) 20 MG tablet Take 20 mg by mouth daily.  11/04/16   [provider]  telmisartan (MICARDIS) 80 MG tablet Take 80 mg by mouth daily.    [provider]    Family History Family History  Problem Relation Age of Onset  . Brain cancer Brother   . Atrial fibrillation Brother   . Colon cancer Neg Hx     Social History Social History   Tobacco Use  . Smoking status: Former Smoker    Types: Cigarettes  . Smokeless tobacco: Never Used  Substance Use Topics  . Alcohol use: Yes    Comment: occas  . Drug use: No     Allergies   Patient has no known allergies.   Review of Systems Review of Systems  All other systems reviewed and are negative.    Physical Exam Updated Vital Signs BP (!) 156/58   Pulse 69   Resp 16   SpO2 100%   Physical Exam  Constitutional: He is oriented to person, place, and time. He appears well-developed and well-nourished. No distress.  HENT:  Head: Normocephalic and atraumatic.  Mouth/Throat: Oropharynx is clear and moist.  Neck: Normal range of motion. Neck supple.  Cardiovascular: Normal rate and regular rhythm. Exam reveals no friction rub.  No murmur heard. Pulmonary/Chest: Effort normal and breath sounds normal. No respiratory distress. He has no wheezes. He has no rales.  Abdominal: Soft. Bowel sounds are normal. He exhibits no distension. There is no tenderness.  Genitourinary: Rectal exam shows guaiac positive stool.  Genitourinary Comments:  Rectal exam unremarkable.  Brown stool is present that is faintly heme-positive.  Musculoskeletal: Normal range of motion. He exhibits no edema.  Neurological: He is alert and oriented to person, place, and time. Coordination normal.  Skin: Skin is warm and dry. He is not diaphoretic.  Nursing note and vitals reviewed.    ED Treatments / Results  Labs (all labs ordered are listed, but only abnormal results are displayed) Labs Reviewed  COMPREHENSIVE METABOLIC PANEL - Abnormal; Notable for the following components:      Result Value   Sodium 134 (*)    Glucose, Bld 126 (*)    BUN 75 (*)    Creatinine, Ser 2.13 (*)  AST 14 (*)    GFR calc non Af Amer 29 (*)    GFR calc Af Amer 33 (*)    All other components within normal limits  CBC - Abnormal; Notable for the following components:   WBC 12.2 (*)    RBC 4.00 (*)    MCV 100.5 (*)    All other components within normal limits  POC OCCULT BLOOD, ED - Abnormal; Notable for the following components:   Fecal Occult Bld POSITIVE (*)    All other components within normal limits  PROTIME-INR  I-STAT TROPONIN, ED  TYPE AND SCREEN  ABO/RH    EKG None  Radiology No results found.  Procedures Procedures (including critical care time)  Medications Ordered in ED Medications  LORazepam (ATIVAN) injection 1 mg (has no administration in time range)  morphine 4 MG/ML injection 4 mg (has no administration in time range)  ondansetron (ZOFRAN) injection 4 mg (has no administration in time range)  morphine 4 MG/ML injection 4 mg (4 mg Intravenous Given 01/02/18 1412)  pantoprazole (PROTONIX) injection 40 mg (40 mg Intravenous Given 01/02/18 1524)     Initial Impression / Assessment and Plan / ED Course  I have reviewed the triage vital signs and the nursing notes.  Pertinent labs & imaging results that were available during my care of the patient were reviewed by me and considered in my medical decision making (see chart for  details).  Patient brought here for evaluation of multiple episodes of vomiting that occurred while attempting to have an MRI performed this afternoon.  He had multiple episodes of what was described as "coffee-ground emesis" that resolved after receiving Zofran by EMS.  Patient is hemodynamically stable.  Laboratory studies are reassuring including hemoglobin of 13.4.  He does have an elevated BUN, but also elevated creatinine.  I am uncertain as to whether this represents an upper GI bleed or possibly dehydration.    Patient will be observed in the emergency department while waiting for his MRI to be obtained.  If he remains hemodynamically stable, I feel as though he will be appropriate for discharge with Protonix and follow-up with his orthopedist.  Patient also seen by Dr. Louanne Skye while in the emergency department.  Care will be signed out to Dr. Ellender Hose at shift change.  He will obtain the results of the MRI and determine the final disposition.  Final Clinical Impressions(s) / ED Diagnoses   Final diagnoses:  None    ED Discharge Orders    None       Veryl Speak, MD 01/02/18 1622

## 2018-01-02 NOTE — ED Notes (Signed)
Bed: FF63 Expected date:  Expected time:  Means of arrival:  Comments: ? GI Bleed

## 2018-01-02 NOTE — ED Notes (Signed)
Pt was unable to complete the MRI d/t pain

## 2018-01-02 NOTE — ED Notes (Signed)
Timothy [name]- wife [phone] Call if there is change in status or for information

## 2018-01-02 NOTE — ED Notes (Signed)
Patient transported to MRI 

## 2018-01-03 ENCOUNTER — Observation Stay (HOSPITAL_COMMUNITY): Payer: Medicare Other

## 2018-01-03 ENCOUNTER — Encounter (HOSPITAL_COMMUNITY): Payer: Self-pay

## 2018-01-03 DIAGNOSIS — K920 Hematemesis: Secondary | ICD-10-CM | POA: Diagnosis not present

## 2018-01-03 DIAGNOSIS — R112 Nausea with vomiting, unspecified: Secondary | ICD-10-CM

## 2018-01-03 DIAGNOSIS — K922 Gastrointestinal hemorrhage, unspecified: Secondary | ICD-10-CM | POA: Diagnosis not present

## 2018-01-03 DIAGNOSIS — I480 Paroxysmal atrial fibrillation: Secondary | ICD-10-CM | POA: Diagnosis not present

## 2018-01-03 DIAGNOSIS — K219 Gastro-esophageal reflux disease without esophagitis: Secondary | ICD-10-CM

## 2018-01-03 DIAGNOSIS — M545 Low back pain: Secondary | ICD-10-CM | POA: Diagnosis not present

## 2018-01-03 DIAGNOSIS — N179 Acute kidney failure, unspecified: Secondary | ICD-10-CM | POA: Diagnosis not present

## 2018-01-03 LAB — BASIC METABOLIC PANEL
Anion gap: 7 (ref 5–15)
BUN: 54 mg/dL — AB (ref 8–23)
CO2: 28 mmol/L (ref 22–32)
Calcium: 8.6 mg/dL — ABNORMAL LOW (ref 8.9–10.3)
Chloride: 101 mmol/L (ref 98–111)
Creatinine, Ser: 1.85 mg/dL — ABNORMAL HIGH (ref 0.61–1.24)
GFR calc Af Amer: 39 mL/min — ABNORMAL LOW (ref >60)
GFR, EST NON AFRICAN AMERICAN: 34 mL/min — AB (ref >60)
GLUCOSE: 101 mg/dL — AB (ref 70–99)
Potassium: 4.6 mmol/L (ref 3.5–5.1)
Sodium: 136 mmol/L (ref 135–145)

## 2018-01-03 LAB — HEMOGLOBIN AND HEMATOCRIT, BLOOD
HCT: 38.1 % — ABNORMAL LOW (ref 39.0–52.0)
Hemoglobin: 12.3 g/dL — ABNORMAL LOW (ref 13.0–17.0)

## 2018-01-03 MED ORDER — CYCLOBENZAPRINE HCL 5 MG PO TABS
5.0000 mg | ORAL_TABLET | Freq: Three times a day (TID) | ORAL | Status: DC
  Administered 2018-01-03 (×2): 5 mg via ORAL
  Filled 2018-01-03 (×2): qty 1

## 2018-01-03 MED ORDER — MORPHINE SULFATE (PF) 2 MG/ML IV SOLN
2.0000 mg | INTRAVENOUS | Status: DC | PRN
  Administered 2018-01-03: 2 mg via INTRAVENOUS
  Filled 2018-01-03: qty 1

## 2018-01-03 MED ORDER — ACETAMINOPHEN 325 MG PO TABS
650.0000 mg | ORAL_TABLET | Freq: Four times a day (QID) | ORAL | Status: DC
  Administered 2018-01-03 – 2018-01-07 (×4): 650 mg via ORAL
  Filled 2018-01-03 (×8): qty 2

## 2018-01-03 MED ORDER — OXYCODONE-ACETAMINOPHEN 5-325 MG PO TABS
1.0000 | ORAL_TABLET | ORAL | Status: DC | PRN
  Administered 2018-01-03: 2 via ORAL
  Filled 2018-01-03: qty 2
  Filled 2018-01-03: qty 1

## 2018-01-03 MED ORDER — FENTANYL CITRATE (PF) 100 MCG/2ML IJ SOLN
25.0000 ug | INTRAMUSCULAR | Status: DC | PRN
  Administered 2018-01-03 (×4): 25 ug via INTRAVENOUS
  Filled 2018-01-03 (×4): qty 2

## 2018-01-03 MED ORDER — DEXTROSE-NACL 5-0.9 % IV SOLN
INTRAVENOUS | Status: AC
  Administered 2018-01-03 – 2018-01-05 (×5): via INTRAVENOUS

## 2018-01-03 MED ORDER — GABAPENTIN 300 MG PO CAPS
300.0000 mg | ORAL_CAPSULE | Freq: Three times a day (TID) | ORAL | Status: DC
  Administered 2018-01-03 – 2018-01-07 (×12): 300 mg via ORAL
  Filled 2018-01-03 (×12): qty 1

## 2018-01-03 MED ORDER — FENTANYL CITRATE (PF) 100 MCG/2ML IJ SOLN
25.0000 ug | INTRAMUSCULAR | Status: DC | PRN
  Administered 2018-01-04: 25 ug via INTRAVENOUS
  Filled 2018-01-03: qty 2

## 2018-01-03 MED ORDER — OXYCODONE-ACETAMINOPHEN 5-325 MG PO TABS
1.0000 | ORAL_TABLET | ORAL | Status: DC | PRN
  Administered 2018-01-04 (×3): 2 via ORAL
  Administered 2018-01-06: 1 via ORAL
  Filled 2018-01-03: qty 2
  Filled 2018-01-03: qty 1
  Filled 2018-01-03 (×2): qty 2

## 2018-01-03 MED ORDER — HYDROMORPHONE HCL 1 MG/ML IJ SOLN
0.5000 mg | INTRAMUSCULAR | Status: DC | PRN
  Administered 2018-01-04 (×2): 0.5 mg via INTRAVENOUS
  Filled 2018-01-03 (×3): qty 0.5

## 2018-01-03 NOTE — Progress Notes (Signed)
PROGRESS NOTE    Timothy [name]  XBD:[id number] DOB: 1940-10-26 DOA: 01/02/2018 PCP: Crist Infante, MD    Brief Narrative:  77 year old male who presented with coffee-ground emesis and back pain.  He does have significant past medical history for GERD, hypertension, dyslipidemia and paroxysmal atrial fibrillation.  Reported 24-hour of coffee-ground emesis, preceded by 2-week history of severe back pain.  Significant decrease in his p.o. intake for the last few days.  Initial physical examination blood pressure 156/58, heart rate 69, respiratory rate 16, oxygen saturation 94%.  His lungs are clear to auscultation bilaterally, heart S1-S2 present and rhythmic, abdomen was soft, nontender nondistended, no lower extremity edema.  Neurologically patient is nonfocal.  Sodium 134, potassium 4.2, chloride 98, bicarb 26, glucose 126, BUN 75, creatinine 2.13, white count 12.2, hemoglobin 13.4, hematocrit 40.2, platelets 153, INR 1.2.   Patient was admitted to the hospital with working diagnosis of coffee-ground emesis likely due to upper GI bleeding.  Assessment & Plan:   Principal Problem:   Upper GI bleed Active Problems:   Mixed hyperlipidemia   PAF (paroxysmal atrial fibrillation) (HCC)   HLD (hyperlipidemia)   GERD (gastroesophageal reflux disease)   Lumbar back pain   Coffee ground emesis   AKI (acute kidney injury) ([place])   Leukocytosis   Gout   1.  Upper GI bleed with acute blood loss anemia. Case discussed with Dr. Bryan Lemma (GI), will continue IV pantoprazole, advance diet as tolerated, holding on endoscopy for now. Will hold on apixaban for now.   2.  Paroxysmal atrial fibrillation. At home not on any rate control agents, telemetry personally reviewed sinus rhythm with pac and pvc. Holding apixaban for now. Echocardiogram from 2018 with preserved LV systolic function (personally reviewed old records)  3.  Pre renal acute kidney injury on CKD stage 3. Will continue hydration with  isotonic saline, follow on renal panel in am, serum K is 4,6 and serum bicarbonate 28 with serum cr at 1,85 down from 2,13.    4.  Lumbar back pain with uncontrolled pain. Will continue pain control with IV morphine, will increase dose to 2 mg IV q2 H, will add tid flexeril and scheduled acetaminophen. No non steroidal anti-inflammatory agents due to reduced GFR.   5.  Hypertension. Systolic blood pressure, 834 mmHG, will continue to hold antihypertensive medications.   6.  Dyslipidemia. Continue statin therapy.    DVT prophylaxis: scd   Code Status:  full Family Communication: I spoke with patient's family at the bedside and all questions were addressed.  Disposition Plan/ discharge barriers: pending clinical improvement.   There is no height or weight on file to calculate BMI. Malnutrition Type:      Malnutrition Characteristics:      Nutrition Interventions:     RN Pressure Injury Documentation:     Consultants:   GI   Procedures:     Antimicrobials:       Subjective: Patient continue to have severe back pain, 10/10 in intensity, worse with movement, radiated to the left leg, no improving factors, associated with nausea.   Objective: Vitals:   01/02/18 1808 01/02/18 1830 01/02/18 2109 01/03/18 0524  BP:  (!) 149/58 136/66 135/64  Pulse: 65 72 60 (!) 57  Resp:   19 12  Temp:   98.3 F (36.8 C) 98 F (36.7 C)  TempSrc:   Oral Oral  SpO2:  97% 100% 99%    Intake/Output Summary (Last 24 hours) at 01/03/2018 1235 Last data filed  at 01/03/2018 0649 Gross per 24 hour  Intake 1473.32 ml  Output 1500 ml  Net -26.68 ml   There were no vitals filed for this visit.  Examination:   General: deconditioned and in pain.  Neurology: Awake and alert, non focal  E KMQ:KMMN pallor, no icterus, oral mucosa moist Cardiovascular: No JVD. S1-S2 present, rhythmic, no gallops, rubs, or murmurs. No lower extremity edema. Pulmonary: positive breath sounds  bilaterally, adequate air movement, no wheezing, rhonchi or rales. Gastrointestinal. Abdomen with no organomegaly, non tender, no rebound or guarding Skin. No rashes Musculoskeletal: no joint deformities     Data Reviewed: I have personally reviewed following labs and imaging studies  CBC: Recent Labs  Lab 01/02/18 1401 01/02/18 1701 01/03/18 0843  WBC 12.2* 11.2*  --   HGB 13.4 13.3 12.3*  HCT 40.2 40.3 38.1*  MCV 100.5* 101.5*  --   PLT 153 166  --    Basic Metabolic Panel: Recent Labs  Lab 01/02/18 1401 01/03/18 0442  NA 134* 136  K 4.2 4.6  CL 98 101  CO2 26 28  GLUCOSE 126* 101*  BUN 75* 54*  CREATININE 2.13* 1.85*  CALCIUM 9.5 8.6*   GFR: CrCl cannot be calculated (Unknown ideal weight.). Liver Function Tests: Recent Labs  Lab 01/02/18 1401  AST 14*  ALT 15  ALKPHOS 67  BILITOT 0.9  PROT 7.4  ALBUMIN 3.8   No results for input(s): LIPASE, AMYLASE in the last 168 hours. No results for input(s): AMMONIA in the last 168 hours. Coagulation Profile: Recent Labs  Lab 01/02/18 1401  INR 1.21   Cardiac Enzymes: No results for input(s): CKTOTAL, CKMB, CKMBINDEX, TROPONINI in the last 168 hours. BNP (last 3 results) No results for input(s): PROBNP in the last 8760 hours. HbA1C: No results for input(s): HGBA1C in the last 72 hours. CBG: No results for input(s): GLUCAP in the last 168 hours. Lipid Profile: No results for input(s): CHOL, HDL, LDLCALC, TRIG, CHOLHDL, LDLDIRECT in the last 72 hours. Thyroid Function Tests: No results for input(s): TSH, T4TOTAL, FREET4, T3FREE, THYROIDAB in the last 72 hours. Anemia Panel: No results for input(s): VITAMINB12, FOLATE, FERRITIN, TIBC, IRON, RETICCTPCT in the last 72 hours.    Radiology Studies: I have reviewed all of the imaging during this hospital visit personally     Scheduled Meds: . pantoprazole (PROTONIX) IV  40 mg Intravenous Q12H   Continuous Infusions:   LOS: 0 days         Caliya Narine Gerome Apley, MD [facility] Pager [phone]

## 2018-01-03 NOTE — Progress Notes (Signed)
Report given to [facility] attendant. Ambulance en route.

## 2018-01-03 NOTE — Progress Notes (Signed)
Pt transferred to [place] via [facility]. Transported via stretcher with attendants x 3.

## 2018-01-03 NOTE — Consult Note (Signed)
Consultation  Referring Provider:     Sander Radon, MD  Primary Care Physician:  Crist Infante, MD Primary Gastroenterologist:    Scarlette Shorts, MD     Reason for Consultation:     Coffee-ground emesis         HPI:   Timothy [name] is a 77 y.o. 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  Patient is wife provide history.  He states that he has been having increasing back and leg pain over the last few weeks, and has been treated with 2 courses of steroid burst along with NSAIDs.  He was scheduled to have MRI yesterday but started to have nausea and coffee-ground emesis in the morning, to include one episode in the waiting room of radiology, and was instead transferred to the emergency department for further evaluation.  No prior similar symptoms.  He does take Eliquis for atrial fibrillation with last dose on 11/8 evening.  He does endorse reduced appetite recently, which he attributed to his back pain.  Otherwise no abdominal pain, and no melena, hematochezia, chest pain, shortness of breath.  He does have a history of reflux, which is typically well controlled with Prilosec 20 mg daily.  He does endorse recent increase in reflux symptoms (heartburn and regurgitation) with steroids, requiring him to take Tums for frequent breakthrough symptoms.  Otherwise no dysphagia, odynophagia.  Does endorse thirst with some hunger this morning.  No prior EGD.  On presentation to the emergency department he was hemodynamically stable and afebrile.  Admission CBC notable for WBC 12.2 (thought secondary to recent steroids), hemoglobin/hematocrit 13.4/40.2 with platelets 153, INR 1.2, BUN/creatinine 75/2.1.  He states his baseline creatinine is 1.9 and BUN in the 30s, follows  with Dr. Apolinar Junes at [facility].  Repeat hemoglobin last evening 13.3.  Overnight, he remained him dynamically stable and afebrile.  Repeat BUN/creatinine this morning 54/1.85 with H/H 12.3/38.1.  He has had no further nausea or emesis since admission.  No BM overnight.  Continues to express thirst.  Past Medical History:  Diagnosis Date  . Anal fissure    ?  . Bright red rectal bleeding   . C2 cervical fracture ([place]) 06/24/2014  . Chronic renal failure   . Diverticulosis   . GERD (gastroesophageal reflux disease)   . H/O prostate cancer 06/05/2011   S/p radiation rx 2010   . Hemorrhage of rectum and anus 05/09/2011  . HLD (hyperlipidemia)   . HTN (hypertension)   . Kidney disease    acute glomeral nepritis  . Paroxysmal atrial fibrillation (HCC)   . Peripheral vascular disease ([place])    carotid artery stent ( right  )  . Prostate cancer ([place])   . 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 Sinus bradycardia     Past Surgical History:  Procedure Laterality Date  . CAROTID STENT    . external beam radiation to prostate  2009  . FLEXIBLE SIGMOIDOSCOPY  05/22/2011   Procedure: FLEXIBLE SIGMOIDOSCOPY;  Surgeon: [name], MD;  Location: [facility]COPY;  Service: Endoscopy;  Laterality: N/A;  . HOT HEMOSTASIS  05/22/2011   Procedure: HOT HEMOSTASIS (ARGON PLASMA COAGULATION/BICAP);  Surgeon: [name], MD;  Location: [facility];  Service: Endoscopy;  Laterality: N/A;    Family History  Problem Relation Age of Onset  .  Brain cancer Brother   . Atrial fibrillation Brother   . Colon cancer Neg Hx      Social History   Tobacco Use  . Smoking status: Former Smoker    Types: Cigarettes  . Smokeless tobacco: Never Used  Substance Use Topics  . Alcohol use: Yes    Comment: occas  . Drug use: No    Prior to Admission medications   Medication Sig Start Date End Date Taking? Authorizing Provider  allopurinol (ZYLOPRIM) 300 MG tablet Take 300 mg by mouth daily.     Yes [provider]  amLODipine (NORVASC) 10 MG tablet Take 10 mg by mouth daily.   Yes [provider]  apixaban (ELIQUIS) 5 MG TABS tablet Take 1 tablet (5 mg total) by mouth 2 (two) times daily. 03/02/17  Yes Nahser, Wonda Cheng, MD  baclofen (LIORESAL) 10 MG tablet Take 1 tablet (10 mg total) by mouth 3 (three) times daily as needed for muscle spasms. 01/01/18  Yes Hilts, Legrand Como, MD  ezetimibe (ZETIA) 10 MG tablet Take 10 mg by mouth daily. 12/10/17  Yes [provider]  furosemide (LASIX) 40 MG tablet Take 80 mg by mouth daily.    Yes [provider]  omeprazole (PRILOSEC) 20 MG capsule Take 20 mg by mouth daily.   Yes [provider]  oxyCODONE-acetaminophen (PERCOCET) 7.5-325 MG tablet Take 1 tablet by mouth every 4 (four) hours as needed for severe pain. 01/01/18  Yes Hilts, Legrand Como, MD  telmisartan (MICARDIS) 80 MG tablet Take 80 mg by mouth daily.   Yes [provider]    Current Facility-Administered Medications  Medication Dose Route Frequency Provider Last Rate Last Dose  . morphine 2 MG/ML injection 1 mg  1 mg Intravenous Q3H PRN Shela Leff, MD   1 mg at 01/03/18 0730  . ondansetron (ZOFRAN) injection 4 mg  4 mg Intravenous Q6H PRN Shela Leff, MD      . pantoprazole (PROTONIX) injection 40 mg  40 mg Intravenous Q12H Shela Leff, MD   40 mg at 01/03/18 0932    Allergies as of 01/02/2018  . (No Known Allergies)     Review of Systems:    As per HPI, otherwise negative    Physical Exam:  Vital signs in last 24 hours: Temp:  [98 F (36.7 C)-98.3 F (36.8 C)] 98 F (36.7 C) (11/10 0524) Pulse Rate:  [57-72] 57 (11/10 0524) Resp:  [12-19] 12 (11/10 0524) BP: (134-156)/(58-66) 135/64 (11/10 0524) SpO2:  [94 %-100 %] 99 % (11/10 0524)   General:   Pleasant male in NAD Head:  Normocephalic and atraumatic. Eyes:   No icterus.   Conjunctiva pink. Ears:  Normal auditory acuity. Neck:  Supple Lungs:   Respirations even and unlabored. Lungs clear to auscultation bilaterally.   No wheezes, crackles, or rhonchi.  Heart:  Regular rate and rhythm; no MRG Abdomen:  Soft, nondistended, nontender. Normal bowel sounds. No appreciable masses or hepatomegaly.  Rectal:  Not performed.  Msk:  Symmetrical without gross deformities.  Extremities:  Without edema. Neurologic:  Alert and  oriented x4;  grossly normal neurologically. Skin:  Intact without significant lesions or rashes. Psych:  Alert and cooperative. Normal affect.  LAB RESULTS: Recent Labs    01/02/18 1401 01/02/18 1701 01/03/18 0843  WBC 12.2* 11.2*  --   HGB 13.4 13.3 12.3*  HCT 40.2 40.3 38.1*  PLT 153 166  --    BMET Recent Labs    01/02/18 1401 01/03/18  0442  NA 134* 136  K 4.2 4.6  CL 98 101  CO2 26 28  GLUCOSE 126* 101*  BUN 75* 54*  CREATININE 2.13* 1.85*  CALCIUM 9.5 8.6*   LFT Recent Labs    01/02/18 1401  PROT 7.4  ALBUMIN 3.8  AST 14*  ALT 15  ALKPHOS 67  BILITOT 0.9   PT/INR Recent Labs    01/02/18 1401  LABPROT 15.2  INR 1.21    STUDIES: Mr Lumbar Spine Wo Contrast  Result Date: 01/02/2018 CLINICAL DATA:  Back pain radiating to left leg. EXAM: MRI LUMBAR SPINE WITHOUT CONTRAST TECHNIQUE: Multiplanar, multisequence MR imaging of the lumbar spine was performed. No intravenous contrast was administered. COMPARISON:  Lumbar spine radiograph 12/24/2017 FINDINGS: The examination was discontinued before completion. There is grade 1 anterolisthesis at L4-L5. The examination otherwise shows normal alignment without evidence of compression fracture. IMPRESSION: Grade 1 anterolisthesis at L4-L5. Otherwise, discontinued examination with limited diagnostic information. Electronically Signed   By: Ulyses Jarred M.D.   On: 01/02/2018 18:18     PREVIOUS ENDOSCOPIES:            -Colonoscopy (2006, Dr. Henrene Pastor): Sigmoid diverticulosis -Flexible sigmoidoscopy (2013, Dr. Deatra Ina): Radiation proctitis, treated  with APC.  Sigmoid diverticulosis   Impression / Plan:   1) Coffee-ground Emesis: Coffee-ground emesis in the setting of recent steroids and NSAID use for back pain.  Otherwise heme dynamically stable and only mild decrement in hemoglobin this morning to 12.3 which may be complicated by some element of delusional component.  Discussed the DDX for his clinical presentation at length with the patient and his wife, to include PUD, gastritis, esophagitis, etc.  Offered EGD for diagnostic and therapeutic intent, however given his overall clinical stability, relatively stable hemoglobin, lack of ongoing symptoms, they decided to hold off on any invasive testing at this time.  - Continue to trend H/H every 12 hours for now -Okay to start clears -Resume Protonix 40 mg IV twice daily for now, if no further evidence of bleeding, can transition to p.o. tomorrow - If evidence of rebleeding, hemodynamic instability, decreasing hemoglobin, or other concerns for rebleeding, please do not hesitate to contact the on-call and will plan on EGD for diagnostic and therapeutic intent -Also discussed the possibility of doing EGD to rule out high risk lesion (i.e. ulcer with high-grade stigmata close (prior to restarting anticoagulation therapy.  They understand this, but declined at this time. -Continue to hold anticoagulation for now  2) GERD: Reflux symptoms have been well controlled with Prilosec 20 mg daily, with recent exacerbation with steroids. - We will treat with Protonix as above, and will eventually titrate to lowest effective dose for continued control of reflux  3) Atrial fibrillation/Systemic anticoagulation:  Holding Eliquis.  Discussed optimal timing to restart therapy with primary Hospitalist Physician.  4) Back pain: Was scheduled for MRI yesterday, but did not obtain due to coffee-ground emesis.  May consider obtaining MRI with sedation on this admission.  Will certainly defer to primary service  and/or orthopedics   LOS: 0 days   Lavena Bullion  01/03/2018, 10:35 AM

## 2018-01-03 NOTE — Progress Notes (Signed)
Report called to 5W at [facility] and given to [facility], RN regarding pt status and reason for transfer.

## 2018-01-03 NOTE — Progress Notes (Signed)
Patient ID: Timothy [name], male   DOB: 1940/03/03, 77 y.o.   MRN: [id number] 77 year old male with history of atrial fibrillation, elloquis. 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 Had onset of back pain and radiation into the left thigh and knee 2 weeks ago after a period of prolong sitting and squatting while mending a fence on his property. He is a retired Software engineer. Has seen Dr. Erlinda Hong and most recently Dr. Junius Roads, an MRI was ordered and he was scheduled for lumbar MRI yesterday. Developed severe nausea and emesis due to narcotic meds for pain. Vomitting Coffee ground emesis consistent with UGI bleeding. stool was positive for occult blood. He was seen at [facility] ER and evaluated. Admitted for probable upper GI Bleeding. Has been evaluated by GI medicine and  Prefers to wait on UGI endoscopy.  Eloquis was stopped on Friday 3 days ago. 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 Pain is present with standing and walking, He is unable to extend his Lumbar spine due to pain. Radiation into the left anterior thigh and knee and anterior left shin.  EXAM: Sitting in bed today. Left knee reflex 0, right 2+ Motor with left quad weakness 4/5 Left foot Dorsiflexion strength is 5-/5 Reverse SLR positive left leg. Plain radiographs with grade 1-2 anterolisthesis L4-5, DDD L5-S1.  MRI attempted yesterday unsuccessful due to severe pain after lying supine for 15-20 minutes.  Imaging is not diagnostic.   DX: Intractable left lumbar radiculopathy with L4 pattern of weakness and pain pattern. Upper GI bleed, stable. Atrial fibrillation off eloquis since Friday 3 days ago.  Plan: I discussed MRI with anesthesia Dr. Bernita Raisin, He was able to determine that MRI at [facility] is not possible due to MRI incompatible anesthesia equipment and ventilator. Will try to arrange for MRI at [facility] to be done tomorrow with  general anesthesia.

## 2018-01-04 ENCOUNTER — Encounter (INDEPENDENT_AMBULATORY_CARE_PROVIDER_SITE_OTHER): Payer: Self-pay | Admitting: Family Medicine

## 2018-01-04 ENCOUNTER — Observation Stay (HOSPITAL_COMMUNITY): Payer: Medicare Other | Admitting: Certified Registered Nurse Anesthetist

## 2018-01-04 ENCOUNTER — Ambulatory Visit (INDEPENDENT_AMBULATORY_CARE_PROVIDER_SITE_OTHER): Payer: Self-pay | Admitting: Family Medicine

## 2018-01-04 ENCOUNTER — Ambulatory Visit (HOSPITAL_COMMUNITY): Admit: 2018-01-04 | Payer: Medicare Other

## 2018-01-04 ENCOUNTER — Encounter (HOSPITAL_COMMUNITY)
Admission: EM | Disposition: A | Discharge status: Home / Self Care | Payer: Self-pay | Source: Home / Self Care | Attending: Family Medicine

## 2018-01-04 ENCOUNTER — Encounter (HOSPITAL_COMMUNITY): Payer: Self-pay

## 2018-01-04 ENCOUNTER — Telehealth (INDEPENDENT_AMBULATORY_CARE_PROVIDER_SITE_OTHER): Payer: Self-pay | Admitting: Family Medicine

## 2018-01-04 ENCOUNTER — Observation Stay (HOSPITAL_COMMUNITY): Payer: Medicare Other

## 2018-01-04 DIAGNOSIS — Z8546 Personal history of malignant neoplasm of prostate: Secondary | ICD-10-CM | POA: Diagnosis not present

## 2018-01-04 DIAGNOSIS — K219 Gastro-esophageal reflux disease without esophagitis: Secondary | ICD-10-CM | POA: Diagnosis present

## 2018-01-04 DIAGNOSIS — N183 Chronic kidney disease, stage 3 (moderate): Secondary | ICD-10-CM | POA: Diagnosis present

## 2018-01-04 DIAGNOSIS — M5137 Other intervertebral disc degeneration, lumbosacral region: Secondary | ICD-10-CM | POA: Diagnosis not present

## 2018-01-04 DIAGNOSIS — Z808 Family history of malignant neoplasm of other organs or systems: Secondary | ICD-10-CM | POA: Diagnosis not present

## 2018-01-04 DIAGNOSIS — K317 Polyp of stomach and duodenum: Secondary | ICD-10-CM | POA: Diagnosis present

## 2018-01-04 DIAGNOSIS — I480 Paroxysmal atrial fibrillation: Secondary | ICD-10-CM | POA: Diagnosis present

## 2018-01-04 DIAGNOSIS — K920 Hematemesis: Secondary | ICD-10-CM | POA: Diagnosis present

## 2018-01-04 DIAGNOSIS — M545 Low back pain: Secondary | ICD-10-CM | POA: Diagnosis not present

## 2018-01-04 DIAGNOSIS — K922 Gastrointestinal hemorrhage, unspecified: Secondary | ICD-10-CM | POA: Diagnosis not present

## 2018-01-04 DIAGNOSIS — M5416 Radiculopathy, lumbar region: Secondary | ICD-10-CM | POA: Diagnosis not present

## 2018-01-04 DIAGNOSIS — M546 Pain in thoracic spine: Secondary | ICD-10-CM | POA: Diagnosis not present

## 2018-01-04 DIAGNOSIS — E785 Hyperlipidemia, unspecified: Secondary | ICD-10-CM | POA: Diagnosis not present

## 2018-01-04 DIAGNOSIS — Z923 Personal history of irradiation: Secondary | ICD-10-CM | POA: Diagnosis not present

## 2018-01-04 DIAGNOSIS — E782 Mixed hyperlipidemia: Secondary | ICD-10-CM | POA: Diagnosis present

## 2018-01-04 DIAGNOSIS — M48061 Spinal stenosis, lumbar region without neurogenic claudication: Secondary | ICD-10-CM | POA: Diagnosis not present

## 2018-01-04 DIAGNOSIS — K7460 Unspecified cirrhosis of liver: Secondary | ICD-10-CM | POA: Diagnosis not present

## 2018-01-04 DIAGNOSIS — E860 Dehydration: Secondary | ICD-10-CM | POA: Diagnosis present

## 2018-01-04 DIAGNOSIS — D72829 Elevated white blood cell count, unspecified: Secondary | ICD-10-CM | POA: Diagnosis present

## 2018-01-04 DIAGNOSIS — D62 Acute posthemorrhagic anemia: Secondary | ICD-10-CM | POA: Diagnosis present

## 2018-01-04 DIAGNOSIS — Z7901 Long term (current) use of anticoagulants: Secondary | ICD-10-CM | POA: Diagnosis not present

## 2018-01-04 DIAGNOSIS — D696 Thrombocytopenia, unspecified: Secondary | ICD-10-CM | POA: Diagnosis present

## 2018-01-04 DIAGNOSIS — N179 Acute kidney failure, unspecified: Secondary | ICD-10-CM | POA: Diagnosis not present

## 2018-01-04 DIAGNOSIS — M5116 Intervertebral disc disorders with radiculopathy, lumbar region: Secondary | ICD-10-CM | POA: Diagnosis present

## 2018-01-04 DIAGNOSIS — I129 Hypertensive chronic kidney disease with stage 1 through stage 4 chronic kidney disease, or unspecified chronic kidney disease: Secondary | ICD-10-CM | POA: Diagnosis present

## 2018-01-04 DIAGNOSIS — E871 Hypo-osmolality and hyponatremia: Secondary | ICD-10-CM | POA: Diagnosis not present

## 2018-01-04 DIAGNOSIS — I739 Peripheral vascular disease, unspecified: Secondary | ICD-10-CM | POA: Diagnosis present

## 2018-01-04 DIAGNOSIS — Z87891 Personal history of nicotine dependence: Secondary | ICD-10-CM | POA: Diagnosis not present

## 2018-01-04 DIAGNOSIS — M109 Gout, unspecified: Secondary | ICD-10-CM | POA: Diagnosis present

## 2018-01-04 HISTORY — PX: RADIOLOGY WITH ANESTHESIA: SHX6223

## 2018-01-04 LAB — TYPE AND SCREEN
ABO/RH(D): A POS
Antibody Screen: NEGATIVE

## 2018-01-04 LAB — ABO/RH: ABO/RH(D): A POS

## 2018-01-04 SURGERY — MRI WITH ANESTHESIA
Anesthesia: General

## 2018-01-04 MED ORDER — METHOCARBAMOL 1000 MG/10ML IJ SOLN
500.0000 mg | Freq: Three times a day (TID) | INTRAVENOUS | Status: DC
  Filled 2018-01-04: qty 5

## 2018-01-04 MED ORDER — METHOCARBAMOL 1000 MG/10ML IJ SOLN
500.0000 mg | Freq: Three times a day (TID) | INTRAVENOUS | Status: DC | PRN
  Filled 2018-01-04: qty 5

## 2018-01-04 MED ORDER — LACTATED RINGERS IV SOLN
INTRAVENOUS | Status: DC
  Administered 2018-01-04: 09:00:00 via INTRAVENOUS

## 2018-01-04 NOTE — Telephone Encounter (Signed)
Patient's wife called to cancel his appointment for today due to the patient being in the hospital.  He also could not have his MRI due to the back pain but he is having one done at the hospital and you should be able to see it in an hour or so.  CB#[phone].  Thank you

## 2018-01-04 NOTE — H&P (View-Only) (Signed)
Daily Rounding Note  01/04/2018, 12:38 PM  LOS: 0 days   SUBJECTIVE:   Chief complaint: CGE.     No further vomiting or sign nausea.  Got Zofran with Percocet this AM as Percocet tends to cause him nausea.  No stool today.   Received sedation for MRI spine today, a bit drowsy currently.    OBJECTIVE:         Vital signs in last 24 hours:    Temp:  [97.3 F (36.3 C)-97.8 F (36.6 C)] 97.3 F (36.3 C) (11/11 1115) Pulse Rate:  [56-68] 66 (11/11 1115) Resp:  [15-19] 19 (11/11 1115) BP: (123-159)/(53-78) 130/71 (11/11 1115) SpO2:  [95 %-100 %] 95 % (11/11 1115) Weight:  [69.6 kg] 69.6 kg (11/10 1842)   Filed Weights   01/03/18 1842  Weight: 69.6 kg   General: looks well, drowsy but arouseable   Heart: RRR Chest: clear bil Abdomen: soft, NT, ND.  Hypoactive BS  Extremities: no CCE Neuro/Psych:  Oriented x 3.  Did not test movement or strength.  No tremor  Intake/Output from previous day: 11/10 0701 - 11/11 0700 In: 1072.8 [I.V.:1072.8] Out: -   Intake/Output this shift: No intake/output data recorded.  Lab Results: Recent Labs    01/02/18 1401 01/02/18 1701 01/03/18 0843  WBC 12.2* 11.2*  --   HGB 13.4 13.3 12.3*  HCT 40.2 40.3 38.1*  PLT 153 166  --    BMET Recent Labs    01/02/18 1401 01/03/18 0442  NA 134* 136  K 4.2 4.6  CL 98 101  CO2 26 28  GLUCOSE 126* 101*  BUN 75* 54*  CREATININE 2.13* 1.85*  CALCIUM 9.5 8.6*   LFT Recent Labs    01/02/18 1401  PROT 7.4  ALBUMIN 3.8  AST 14*  ALT 15  ALKPHOS 67  BILITOT 0.9   PT/INR Recent Labs    01/02/18 1401  LABPROT 15.2  INR 1.21   Hepatitis Panel No results for input(s): HEPBSAG, HCVAB, HEPAIGM, HEPBIGM in the last 72 hours.  Studies/Results: Mr Lumbar Spine Wo Contrast  Result Date: 01/04/2018 CLINICAL DATA:  Low back pain.  No known injury EXAM: MRI LUMBAR SPINE WITHOUT CONTRAST TECHNIQUE: Multiplanar, multisequence MR  imaging of the lumbar spine was performed. No intravenous contrast was administered. COMPARISON:  None. FINDINGS: Segmentation:  Standard. Alignment: Minimal retrolisthesis of L3 on L4. Grade 1 anterolisthesis of L4 on L5 secondary to facet disease. Vertebrae:  No fracture, evidence of discitis, or bone lesion. Conus medullaris and cauda equina: Conus extends to the L1 level. Conus and cauda equina appear normal. Paraspinal and other soft tissues: No acute paraspinal abnormality. Small left renal cyst. Disc levels: Disc spaces: Degenerative disc disease with disc height loss at L5-S1. Disc desiccation at L1-2, L3-4 and L4-5. T12-L1: No significant disc bulge. No evidence of neural foraminal stenosis. No central canal stenosis. L1-L2: Mild broad-based disc bulge. Mild bilateral facet arthropathy. Mild spinal stenosis. No evidence of neural foraminal stenosis. L2-L3: Broad-based disc bulge. Moderate bilateral facet arthropathy with bilateral facet effusions. Mild spinal stenosis and bilateral lateral recess stenosis. Mild bilateral foraminal stenosis. L3-L4: Broad-based disc bulge with a central broad disc extrusion and left paracentral cranial migration of disc material along the L3 vertebral body with mass effect on the left intraspinal L4 nerve root and possibly contacting the left L3 nerve root. Moderate left foraminal stenosis. No right foraminal stenosis. Moderate bilateral facet arthropathy with facet effusions.  Moderate-severe spinal stenosis. L4-L5: Broad-based disc bulge. Severe bilateral facet arthropathy. Moderate spinal stenosis and bilateral lateral recess stenosis. Mild bilateral foraminal stenosis. L5-S1: Broad-based disc bulge. Moderate bilateral facet arthropathy. Mild bilateral foraminal stenosis. No central canal stenosis. IMPRESSION: 1. At L3-4 there is a broad-based disc bulge with a central broad disc extrusion diffuse lumbar spine spondylosis as described above. And left paracentral cranial  migration of disc material along the L3 vertebral body with mass effect on the left intraspinal L4 nerve root and possibly contacting the left L3 nerve root. Moderate left foraminal stenosis. No right foraminal stenosis. Moderate bilateral facet arthropathy with facet effusions. Moderate-severe spinal stenosis. 2. Electronically Signed   By: Kathreen Devoid   On: 01/04/2018 11:46   Mr Lumbar Spine Wo Contrast  Result Date: 01/02/2018 CLINICAL DATA:  Back pain radiating to left leg. EXAM: MRI LUMBAR SPINE WITHOUT CONTRAST TECHNIQUE: Multiplanar, multisequence MR imaging of the lumbar spine was performed. No intravenous contrast was administered. COMPARISON:  Lumbar spine radiograph 12/24/2017 FINDINGS: The examination was discontinued before completion. There is grade 1 anterolisthesis at L4-L5. The examination otherwise shows normal alignment without evidence of compression fracture. IMPRESSION: Grade 1 anterolisthesis at L4-L5. Otherwise, discontinued examination with limited diagnostic information. Electronically Signed   By: Ulyses Jarred M.D.   On: 01/02/2018 18:18   Scheduled Meds: . acetaminophen  650 mg Oral Q6H  . gabapentin  300 mg Oral TID  . pantoprazole (PROTONIX) IV  40 mg Intravenous Q12H   Continuous Infusions: . dextrose 5 % and 0.9% NaCl 75 mL/hr at 01/04/18 1142  . lactated ringers 10 mL/hr at 01/04/18 0843  . methocarbamol (ROBAXIN) IV     PRN Meds:.fentaNYL (SUBLIMAZE) injection, HYDROmorphone (DILAUDID) injection, methocarbamol (ROBAXIN) IV, ondansetron (ZOFRAN) IV, oxyCODONE-acetaminophen   ASSESMENT:   *  CGE.  Pt now ready to undergo EGD.      *   Chronic Eliquis for A fib.  On hold.  Last does was 11/8.    *   Deg disc disease with radiculopathy.  MRI today: L3-4 bulging disc, herniation.  Causing mass effect on L4 nerve.  Spinal stenosis.   Dr Louanne Skye is following.     PLAN   *  EGD tomorrow AM.  Full liquid diet today.      [name]  01/04/2018, 12:38  PM Phone [phone]    Attending physician's note   I have reviewed the chart and labs. I agree with the Advanced Practitioner's note, impression and recommendations.   77 year old with coffee-ground emesis, on Eliquis for A Fib (on hold), now willing to get EGD performed. Hb stable. Has ARF, LBP d/t DJD/disc with spinal stenosis. Has been on steroids and nonsteroidals. Plan: Trend CBC, continue Protonix, EGD in a.m.  Carmell Austria, MD   CBC Latest Ref Rng & Units 01/03/2018 01/02/2018 01/02/2018  WBC 4.0 - 10.5 K/uL - 11.2(H) 12.2(H)  Hemoglobin 13.0 - 17.0 g/dL 12.3(L) 13.3 13.4  Hematocrit 39.0 - 52.0 % 38.1(L) 40.3 40.2  Platelets 150 - 400 K/uL - 166 153   CMP Latest Ref Rng & Units 01/03/2018 01/02/2018 06/24/2014  Glucose 70 - 99 mg/dL 101(H) 126(H) 101(H)  BUN 8 - 23 mg/dL 54(H) 75(H) 29(H)  Creatinine 0.61 - 1.24 mg/dL 1.85(H) 2.13(H) 1.64(H)  Sodium 135 - 145 mmol/L 136 134(L) 136  Potassium 3.5 - 5.1 mmol/L 4.6 4.2 4.6  Chloride 98 - 111 mmol/L 101 98 103  CO2 22 - 32 mmol/L 28 26 23  Calcium 8.9 - 10.3 mg/dL 8.6(L) 9.5 9.1  Total Protein 6.5 - 8.1 g/dL - 7.4 6.9  Total Bilirubin 0.3 - 1.2 mg/dL - 0.9 0.7  Alkaline Phos 38 - 126 U/L - 67 46  AST 15 - 41 U/L - 14(L) 32  ALT 0 - 44 U/L - 15 18

## 2018-01-04 NOTE — Telephone Encounter (Signed)
FYI

## 2018-01-04 NOTE — Anesthesia Preprocedure Evaluation (Addendum)
Anesthesia Evaluation  Patient identified by MRN, date of birth, ID band Patient awake    Reviewed: Allergy & Precautions, NPO status , Patient's Chart, lab work & pertinent test results  Airway Mallampati: I  TM Distance: >3 FB Neck ROM: Full    Dental no notable dental hx. (+) Teeth Intact, Dental Advisory Given   Pulmonary neg pulmonary ROS, former smoker,    Pulmonary exam normal breath sounds clear to auscultation       Cardiovascular hypertension, + Peripheral Vascular Disease (s/p carotid artery stent)  Normal cardiovascular exam+ dysrhythmias (on eliquis, last dose Friday evening) Atrial Fibrillation  Rhythm:Regular Rate:Normal  Stress 03/2017 Nuclear stress EF: 65%. Blood pressure demonstrated a normal response to exercise. There was no ST segment deviation noted during stress. No T wave inversion was noted during stress. This is a low risk study.  TTE 2018 - Normal LV systolic function; mild diastolic dysfunction; mild TR with mildly elevated pulmonary pressure.   Neuro/Psych negative neurological ROS  negative psych ROS   GI/Hepatic Neg liver ROS, GERD  ,  Endo/Other  negative endocrine ROS  Renal/GU negative Renal ROS  negative genitourinary   Musculoskeletal negative musculoskeletal ROS (+)   Abdominal   Peds  Hematology negative hematology ROS (+)   Anesthesia Other Findings H/o prostate cancer s/p radiation  Lumbar MRI to evaluate back pain  Reproductive/Obstetrics                            Anesthesia Physical Anesthesia Plan  ASA: III  Anesthesia Plan: General   Post-op Pain Management:    Induction: Intravenous  PONV Risk Score and Plan: 2 and Ondansetron, Dexamethasone and Treatment may vary due to age or medical condition  Airway Management Planned: LMA and Oral ETT  Additional Equipment:   Intra-op Plan:   Post-operative Plan: Extubation in  OR  Informed Consent: I have reviewed the patients History and Physical, chart, labs and discussed the procedure including the risks, benefits and alternatives for the proposed anesthesia with the patient or authorized representative who has indicated his/her understanding and acceptance.   Dental advisory given  Plan Discussed with: CRNA  Anesthesia Plan Comments:         Anesthesia Quick Evaluation

## 2018-01-04 NOTE — Progress Notes (Signed)
Patient ID: Timothy [name], male   DOB: 10-Apr-1940, 77 y.o.   MRN: [id number] MRi with new large central and left HNP L3-4 . Patient would like to try an ESI. I will try to schedule for today by radiology  Interventionalist.

## 2018-01-04 NOTE — Telephone Encounter (Signed)
MRI shows left-sided disc extrusion/protrusion causing L4 and possibly L3 nerve compression.  Spinal canal narrowing (stenosis) at this level as well.  Findings are similar to MRI from 2014, although at the next lower level.

## 2018-01-04 NOTE — Transfer of Care (Signed)
Immediate Anesthesia Transfer of Care Note  Patient: Timothy [name]  Procedure(s) Performed: MRI WITH ANESTHESIA/LUMBAR SPINE WITHOUT CONTRAST (N/A )  Patient Location: PACU  Anesthesia Type:General  Level of Consciousness: alert , oriented and drowsy  Airway & Oxygen Therapy: Patient Spontanous Breathing and Patient connected to nasal cannula oxygen  Post-op Assessment: Report given to RN, Post -op Vital signs reviewed and stable and Patient moving all extremities  Post vital signs: Reviewed and stable  Last Vitals:  Vitals Value Taken Time  BP 138/53 01/04/2018 10:45 AM  Temp 36.6 C 01/04/2018 10:45 AM  Pulse 56 01/04/2018 10:52 AM  Resp 20 01/04/2018 10:52 AM  SpO2 98 % 01/04/2018 10:52 AM  Vitals shown include unvalidated device data.  Last Pain:  Vitals:   01/04/18 1045  TempSrc:   PainSc: 0-No pain      Patients Stated Pain Goal: 7 (37/34/28 7681)  Complications: No apparent anesthesia complications

## 2018-01-04 NOTE — Progress Notes (Signed)
Patient has continuously complained of 10/10 left lower back pain. Nettey,MD has been paged multiple times and he placed orders for PRN percocet and dilaudid pain medications. Patient reports that these pain medications have given him little relief, but the PRN percocet did allow him to sleep for a little over an hour. Nettey,MD called and spoke with patient and his wife about the PRN pain meds he had prescribed. Will continue to monitor and treat per MD orders.

## 2018-01-04 NOTE — Progress Notes (Signed)
Patient Demographics:    Timothy [name], is a 77 y.o. male, DOB - 1940/07/18, KCL:[id number]  Admit date - 01/02/2018   Admitting Physician Timothy Leff, MD  Outpatient Primary MD for the patient is Timothy Infante, MD  LOS - 0   Chief Complaint  Patient presents with  . Hematemesis        Subjective:    Timothy [name] today has no fevers, No chest pain, wife at bedside, questions answered, nausea persist, no further emesis, fatigue and dizziness persist , patient complained of dyspnea on exertion when he ambulated to the bathroom,  Assessment  & Plan :    Principal Problem:   Upper GI bleed Active Problems:   Mixed hyperlipidemia   PAF (paroxysmal atrial fibrillation) (HCC)   HLD (hyperlipidemia)   GERD (gastroesophageal reflux disease)   Lumbar back pain   Coffee ground emesis   AKI (acute kidney injury) ([place])   Leukocytosis   Gout  Brief Narrative:  77 year old male who presented with coffee-ground emesis and back pain.  He does have significant past medical history for GERD, hypertension, dyslipidemia and paroxysmal atrial fibrillation.  Reported 24-hour of coffee-ground emesis, preceded by 2-week history of severe back pain.  Significant decrease in his p.o. intake for the last few days.  Initial physical examination blood pressure 156/58, heart rate 69, respiratory rate 16, oxygen saturation 94%.  His lungs are clear to auscultation bilaterally, heart S1-S2 present and rhythmic, abdomen was soft, nontender nondistended, no lower extremity edema.  Neurologically patient is nonfocal.  Sodium 134, potassium 4.2, chloride 98, bicarb 26, glucose 126, BUN 75, creatinine 2.13, white count 12.2, hemoglobin 13.4, hematocrit 40.2, platelets 153, INR 1.2.   Plan:- 1)Acute Gi Bleed--nausea persist, no further emesis, fatigue and dizziness persist , patient complained of dyspnea on exertion when he  ambulated to the bathroom, suspect upper GI bleed secondary to steroid use in the patient who is chronically anticoagulated on apixaban, last dose of apixaban 01/11/2018, discussed with GI service patient scheduled for EGD on 01/05/2018, we need to make sure there is no high risk of rebleeding if Timothy [name] is restarted, follow H&H and transfuse as clinically indicated  2)LBP-back pain is very severe, patient continues to require IV Dilaudid, MRi with new large central and left HNP L3-4 . Patient would like to try an ESI, interventional radiology to attempt [facility], orthopedic surgeon Dr. Louanne [name] aware. ,  PRN methocarbamol, oxycodone  3)PAfib-continue to hold apixaban due to acute GI bleed to allow for EGD in a.m. 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   creatinine on admission= 2.1 ,   baseline creatinine = 1.6   , creatinine is now=1.85   , renally adjust medications, avoid nephrotoxic agents/dehydration/hypotension   Disposition/Need for in-Hospital Stay- patient unable to be discharged at this time due to acute symptomatic GI bleed requiring further investigation prior to restarting anticoagulation-nausea persist, no further emesis, fatigue and dizziness persist , patient complained of dyspnea on exertion when he ambulated to the bathroom, low back pain requiring interventional radiology procedure and IV Dilaudid/pain medication  Code Status :  Full   Disposition Plan  : home  Consults  :  Gi/Ortho/IR   DVT Prophylaxis  : SCDs/apixaban hold since 01/01/2018  Lab Results  Component Value Date   PLT 166 01/02/2018    Inpatient Medications  Scheduled Meds: . acetaminophen  650 mg Oral Q6H  . gabapentin  300 mg Oral TID  . pantoprazole (PROTONIX) IV  40 mg Intravenous Q12H   Continuous Infusions: . dextrose 5 % and 0.9% NaCl 75 mL/hr at  01/04/18 1512  . lactated ringers 10 mL/hr at 01/04/18 0843  . methocarbamol (ROBAXIN) IV     PRN Meds:.fentaNYL (SUBLIMAZE) injection, HYDROmorphone (DILAUDID) injection, methocarbamol (ROBAXIN) IV, ondansetron (ZOFRAN) IV, oxyCODONE-acetaminophen    Anti-infectives (From admission, onward)   None        Objective:   Vitals:   01/04/18 0533 01/04/18 1045 01/04/18 1100 01/04/18 1115  BP: 123/63 (!) 138/53 135/60 130/71  Pulse: (!) 56 67 65 66  Resp:  15 16 19   Temp: 97.8 F (36.6 C) 97.8 F (36.6 C)  (!) 97.3 F (36.3 C)  TempSrc: Oral     SpO2: 98% 99% 99% 95%  Weight:      Height:        Wt Readings from Last 3 Encounters:  01/03/18 69.6 kg  04/24/17 74.8 kg  04/01/17 75.3 kg    Intake/Output Summary (Last 24 hours) at 01/04/2018 1625 Last data filed at 01/04/2018 0700 Gross per 24 hour  Intake 1025.87 ml  Output -  Net 1025.87 ml   Physical Exam Patient is examined daily including today on 01/04/18 , exams remain the same as of yesterday except that has changed   Gen:- Awake Alert,  In no apparent distress  HEENT:- [place].AT, No sclera icterus Neck-Supple Neck,No JVD,.  Lungs-  CTAB , fair symmetrical air movement CV- S1, S2 normal, regular (h/o AFib) Abd-  +ve B.Sounds, Abd Soft, No tenderness,    Extremity/Skin:- No  edema, pedal pulses present  Psych-affect is appropriate, oriented x3 Neuro-no new focal deficits, no tremors   Data Review:   Micro Results No results found for this or any previous visit (from the past 240 hour(s)).  Radiology Reports Mr Lumbar Spine Wo Contrast  Result Date: 01/04/2018 CLINICAL DATA:  Low back pain.  No known injury EXAM: MRI LUMBAR SPINE WITHOUT CONTRAST TECHNIQUE: Multiplanar, multisequence MR imaging of the lumbar spine was performed. No intravenous contrast was administered. COMPARISON:  None. FINDINGS: Segmentation:  Standard. Alignment: Minimal retrolisthesis of L3 on L4. Grade 1 anterolisthesis of L4 on L5  secondary to facet disease. Vertebrae:  No fracture, evidence of discitis, or bone lesion. Conus medullaris and cauda equina: Conus extends to the L1 level. Conus and cauda equina appear normal. Paraspinal and other soft tissues: No acute paraspinal abnormality. Small left renal cyst. Disc levels: Disc spaces: Degenerative disc disease with disc height loss at L5-S1. Disc desiccation at L1-2, L3-4 and L4-5. T12-L1: No significant disc bulge. No evidence of neural foraminal stenosis. No central canal stenosis. L1-L2: Mild broad-based disc bulge. Mild bilateral facet arthropathy. Mild spinal stenosis. No evidence of neural foraminal stenosis. L2-L3: Broad-based disc bulge. Moderate bilateral facet arthropathy with bilateral facet effusions. Mild spinal stenosis and bilateral lateral recess stenosis. Mild bilateral foraminal stenosis. L3-L4: Broad-based disc bulge with a central broad disc extrusion and left paracentral cranial migration of disc material along the L3 vertebral body with mass effect on the left intraspinal L4 nerve root and possibly contacting the left L3 nerve  root. Moderate left foraminal stenosis. No right foraminal stenosis. Moderate bilateral facet arthropathy with facet effusions. Moderate-severe spinal stenosis. L4-L5: Broad-based disc bulge. Severe bilateral facet arthropathy. Moderate spinal stenosis and bilateral lateral recess stenosis. Mild bilateral foraminal stenosis. L5-S1: Broad-based disc bulge. Moderate bilateral facet arthropathy. Mild bilateral foraminal stenosis. No central canal stenosis. IMPRESSION: 1. At L3-4 there is a broad-based disc bulge with a central broad disc extrusion diffuse lumbar spine spondylosis as described above. And left paracentral cranial migration of disc material along the L3 vertebral body with mass effect on the left intraspinal L4 nerve root and possibly contacting the left L3 nerve root. Moderate left foraminal stenosis. No right foraminal stenosis.  Moderate bilateral facet arthropathy with facet effusions. Moderate-severe spinal stenosis. 2. Electronically Signed   By: Kathreen Devoid   On: 01/04/2018 11:46   Mr Lumbar Spine Wo Contrast  Result Date: 01/02/2018 CLINICAL DATA:  Back pain radiating to left leg. EXAM: MRI LUMBAR SPINE WITHOUT CONTRAST TECHNIQUE: Multiplanar, multisequence MR imaging of the lumbar spine was performed. No intravenous contrast was administered. COMPARISON:  Lumbar spine radiograph 12/24/2017 FINDINGS: The examination was discontinued before completion. There is grade 1 anterolisthesis at L4-L5. The examination otherwise shows normal alignment without evidence of compression fracture. IMPRESSION: Grade 1 anterolisthesis at L4-L5. Otherwise, discontinued examination with limited diagnostic information. Electronically Signed   By: Ulyses Jarred M.D.   On: 01/02/2018 18:18   Xr Lumbar Spine 2-3 Views  Result Date: 12/24/2017 X-rays of the lumbar spine reveal abnormal straightening.  He does appear to have a retrolisthesis at L5 with significant degenerative disc disease L5-S1.   CBC Recent Labs  Lab 01/02/18 1401 01/02/18 1701 01/03/18 0843  WBC 12.2* 11.2*  --   HGB 13.4 13.3 12.3*  HCT 40.2 40.3 38.1*  PLT 153 166  --   MCV 100.5* 101.5*  --   MCH 33.5 33.5  --   MCHC 33.3 33.0  --   RDW 15.5 15.8*  --     Chemistries  Recent Labs  Lab 01/02/18 1401 01/03/18 0442  NA 134* 136  K 4.2 4.6  CL 98 101  CO2 26 28  GLUCOSE 126* 101*  BUN 75* 54*  CREATININE 2.13* 1.85*  CALCIUM 9.5 8.6*  AST 14*  --   ALT 15  --   ALKPHOS 67  --   BILITOT 0.9  --   ------------------------------------------------------------------------------------------------------------------ No results for input(s): CHOL, HDL, LDLCALC, TRIG, CHOLHDL, LDLDIRECT in the last 72 hours.  No results found for:  HGBA1C ------------------------------------------------------------------------------------------------------------------ No results for input(s): TSH, T4TOTAL, T3FREE, THYROIDAB in the last 72 hours.  Invalid input(s): FREET3 ------------------------------------------------------------------------------------------------------------------ No results for input(s): VITAMINB12, FOLATE, FERRITIN, TIBC, IRON, RETICCTPCT in the last 72 hours.  Coagulation profile Recent Labs  Lab 01/02/18 1401  INR 1.21   No results for input(s): DDIMER in the last 72 hours.  Cardiac Enzymes No results for input(s): CKMB, TROPONINI, MYOGLOBIN in the last 168 hours.  Invalid input(s): CK ------------------------------------------------------------------------------------------------------------------ No results found for: BNP  Roxan Hockey M.D on 01/04/2018 at 4:25 PM  Pager---[phone] Go to [email or web address] - password [email or web address] for contact info  [facility] - Office  [phone]

## 2018-01-04 NOTE — Progress Notes (Addendum)
Daily Rounding Note  01/04/2018, 12:38 PM  LOS: 0 days   SUBJECTIVE:   Chief complaint: CGE.     No further vomiting or sign nausea.  Got Zofran with Percocet this AM as Percocet tends to cause him nausea.  No stool today.   Received sedation for MRI spine today, a bit drowsy currently.    OBJECTIVE:         Vital signs in last 24 hours:    Temp:  [97.3 F (36.3 C)-97.8 F (36.6 C)] 97.3 F (36.3 C) (11/11 1115) Pulse Rate:  [56-68] 66 (11/11 1115) Resp:  [15-19] 19 (11/11 1115) BP: (123-159)/(53-78) 130/71 (11/11 1115) SpO2:  [95 %-100 %] 95 % (11/11 1115) Weight:  [69.6 kg] 69.6 kg (11/10 1842)   Filed Weights   01/03/18 1842  Weight: 69.6 kg   General: looks well, drowsy but arouseable   Heart: RRR Chest: clear bil Abdomen: soft, NT, ND.  Hypoactive BS  Extremities: no CCE Neuro/Psych:  Oriented x 3.  Did not test movement or strength.  No tremor  Intake/Output from previous day: 11/10 0701 - 11/11 0700 In: 1072.8 [I.V.:1072.8] Out: -   Intake/Output this shift: No intake/output data recorded.  Lab Results: Recent Labs    01/02/18 1401 01/02/18 1701 01/03/18 0843  WBC 12.2* 11.2*  --   HGB 13.4 13.3 12.3*  HCT 40.2 40.3 38.1*  PLT 153 166  --    BMET Recent Labs    01/02/18 1401 01/03/18 0442  NA 134* 136  K 4.2 4.6  CL 98 101  CO2 26 28  GLUCOSE 126* 101*  BUN 75* 54*  CREATININE 2.13* 1.85*  CALCIUM 9.5 8.6*   LFT Recent Labs    01/02/18 1401  PROT 7.4  ALBUMIN 3.8  AST 14*  ALT 15  ALKPHOS 67  BILITOT 0.9   PT/INR Recent Labs    01/02/18 1401  LABPROT 15.2  INR 1.21   Hepatitis Panel No results for input(s): HEPBSAG, HCVAB, HEPAIGM, HEPBIGM in the last 72 hours.  Studies/Results: Mr Lumbar Spine Wo Contrast  Result Date: 01/04/2018 CLINICAL DATA:  Low back pain.  No known injury EXAM: MRI LUMBAR SPINE WITHOUT CONTRAST TECHNIQUE: Multiplanar, multisequence MR  imaging of the lumbar spine was performed. No intravenous contrast was administered. COMPARISON:  None. FINDINGS: Segmentation:  Standard. Alignment: Minimal retrolisthesis of L3 on L4. Grade 1 anterolisthesis of L4 on L5 secondary to facet disease. Vertebrae:  No fracture, evidence of discitis, or bone lesion. Conus medullaris and cauda equina: Conus extends to the L1 level. Conus and cauda equina appear normal. Paraspinal and other soft tissues: No acute paraspinal abnormality. Small left renal cyst. Disc levels: Disc spaces: Degenerative disc disease with disc height loss at L5-S1. Disc desiccation at L1-2, L3-4 and L4-5. T12-L1: No significant disc bulge. No evidence of neural foraminal stenosis. No central canal stenosis. L1-L2: Mild broad-based disc bulge. Mild bilateral facet arthropathy. Mild spinal stenosis. No evidence of neural foraminal stenosis. L2-L3: Broad-based disc bulge. Moderate bilateral facet arthropathy with bilateral facet effusions. Mild spinal stenosis and bilateral lateral recess stenosis. Mild bilateral foraminal stenosis. L3-L4: Broad-based disc bulge with a central broad disc extrusion and left paracentral cranial migration of disc material along the L3 vertebral body with mass effect on the left intraspinal L4 nerve root and possibly contacting the left L3 nerve root. Moderate left foraminal stenosis. No right foraminal stenosis. Moderate bilateral facet arthropathy with facet effusions.  Moderate-severe spinal stenosis. L4-L5: Broad-based disc bulge. Severe bilateral facet arthropathy. Moderate spinal stenosis and bilateral lateral recess stenosis. Mild bilateral foraminal stenosis. L5-S1: Broad-based disc bulge. Moderate bilateral facet arthropathy. Mild bilateral foraminal stenosis. No central canal stenosis. IMPRESSION: 1. At L3-4 there is a broad-based disc bulge with a central broad disc extrusion diffuse lumbar spine spondylosis as described above. And left paracentral cranial  migration of disc material along the L3 vertebral body with mass effect on the left intraspinal L4 nerve root and possibly contacting the left L3 nerve root. Moderate left foraminal stenosis. No right foraminal stenosis. Moderate bilateral facet arthropathy with facet effusions. Moderate-severe spinal stenosis. 2. Electronically Signed   By: Kathreen Devoid   On: 01/04/2018 11:46   Mr Lumbar Spine Wo Contrast  Result Date: 01/02/2018 CLINICAL DATA:  Back pain radiating to left leg. EXAM: MRI LUMBAR SPINE WITHOUT CONTRAST TECHNIQUE: Multiplanar, multisequence MR imaging of the lumbar spine was performed. No intravenous contrast was administered. COMPARISON:  Lumbar spine radiograph 12/24/2017 FINDINGS: The examination was discontinued before completion. There is grade 1 anterolisthesis at L4-L5. The examination otherwise shows normal alignment without evidence of compression fracture. IMPRESSION: Grade 1 anterolisthesis at L4-L5. Otherwise, discontinued examination with limited diagnostic information. Electronically Signed   By: Ulyses Jarred M.D.   On: 01/02/2018 18:18   Scheduled Meds: . acetaminophen  650 mg Oral Q6H  . gabapentin  300 mg Oral TID  . pantoprazole (PROTONIX) IV  40 mg Intravenous Q12H   Continuous Infusions: . dextrose 5 % and 0.9% NaCl 75 mL/hr at 01/04/18 1142  . lactated ringers 10 mL/hr at 01/04/18 0843  . methocarbamol (ROBAXIN) IV     PRN Meds:.fentaNYL (SUBLIMAZE) injection, HYDROmorphone (DILAUDID) injection, methocarbamol (ROBAXIN) IV, ondansetron (ZOFRAN) IV, oxyCODONE-acetaminophen   ASSESMENT:   *  CGE.  Pt now ready to undergo EGD.      *   Chronic Eliquis for A fib.  On hold.  Last does was 11/8.    *   Deg disc disease with radiculopathy.  MRI today: L3-4 bulging disc, herniation.  Causing mass effect on L4 nerve.  Spinal stenosis.   Dr Louanne Skye is following.     PLAN   *  EGD tomorrow AM.  Full liquid diet today.      [name]  01/04/2018, 12:38  PM Phone [phone]    Attending physician's note   I have reviewed the chart and labs. I agree with the Advanced Practitioner's note, impression and recommendations.   77 year old with coffee-ground emesis, on Eliquis for A Fib (on hold), now willing to get EGD performed. Hb stable. Has ARF, LBP d/t DJD/disc with spinal stenosis. Has been on steroids and nonsteroidals. Plan: Trend CBC, continue Protonix, EGD in a.m.  Carmell Austria, MD   CBC Latest Ref Rng & Units 01/03/2018 01/02/2018 01/02/2018  WBC 4.0 - 10.5 K/uL - 11.2(H) 12.2(H)  Hemoglobin 13.0 - 17.0 g/dL 12.3(L) 13.3 13.4  Hematocrit 39.0 - 52.0 % 38.1(L) 40.3 40.2  Platelets 150 - 400 K/uL - 166 153   CMP Latest Ref Rng & Units 01/03/2018 01/02/2018 06/24/2014  Glucose 70 - 99 mg/dL 101(H) 126(H) 101(H)  BUN 8 - 23 mg/dL 54(H) 75(H) 29(H)  Creatinine 0.61 - 1.24 mg/dL 1.85(H) 2.13(H) 1.64(H)  Sodium 135 - 145 mmol/L 136 134(L) 136  Potassium 3.5 - 5.1 mmol/L 4.6 4.2 4.6  Chloride 98 - 111 mmol/L 101 98 103  CO2 22 - 32 mmol/L 28 26 23  Calcium 8.9 - 10.3 mg/dL 8.6(L) 9.5 9.1  Total Protein 6.5 - 8.1 g/dL - 7.4 6.9  Total Bilirubin 0.3 - 1.2 mg/dL - 0.9 0.7  Alkaline Phos 38 - 126 U/L - 67 46  AST 15 - 41 U/L - 14(L) 32  ALT 0 - 44 U/L - 15 18

## 2018-01-05 ENCOUNTER — Inpatient Hospital Stay (HOSPITAL_COMMUNITY): Payer: Medicare Other | Admitting: Anesthesiology

## 2018-01-05 ENCOUNTER — Encounter (HOSPITAL_COMMUNITY)
Admission: EM | Disposition: A | Discharge status: Home / Self Care | Payer: Self-pay | Source: Home / Self Care | Attending: Family Medicine

## 2018-01-05 ENCOUNTER — Encounter (HOSPITAL_COMMUNITY): Payer: Self-pay | Admitting: Anesthesiology

## 2018-01-05 HISTORY — PX: ESOPHAGOGASTRODUODENOSCOPY (EGD) WITH PROPOFOL: SHX5813

## 2018-01-05 LAB — COMPREHENSIVE METABOLIC PANEL
ALT: 13 U/L (ref 0–44)
AST: 17 U/L (ref 15–41)
Albumin: 2.6 g/dL — ABNORMAL LOW (ref 3.5–5.0)
Alkaline Phosphatase: 57 U/L (ref 38–126)
Anion gap: 6 (ref 5–15)
BUN: 23 mg/dL (ref 8–23)
CHLORIDE: 103 mmol/L (ref 98–111)
CO2: 23 mmol/L (ref 22–32)
Calcium: 8.3 mg/dL — ABNORMAL LOW (ref 8.9–10.3)
Creatinine, Ser: 1.46 mg/dL — ABNORMAL HIGH (ref 0.61–1.24)
GFR calc Af Amer: 52 mL/min — ABNORMAL LOW (ref >60)
GFR, EST NON AFRICAN AMERICAN: 45 mL/min — AB (ref >60)
Glucose, Bld: 133 mg/dL — ABNORMAL HIGH (ref 70–99)
POTASSIUM: 4.6 mmol/L (ref 3.5–5.1)
Sodium: 132 mmol/L — ABNORMAL LOW (ref 135–145)
Total Bilirubin: 0.6 mg/dL (ref 0.3–1.2)
Total Protein: 5.8 g/dL — ABNORMAL LOW (ref 6.5–8.1)

## 2018-01-05 LAB — CBC
HCT: 31.1 % — ABNORMAL LOW (ref 39.0–52.0)
HCT: 34.1 % — ABNORMAL LOW (ref 39.0–52.0)
Hemoglobin: 10.2 g/dL — ABNORMAL LOW (ref 13.0–17.0)
Hemoglobin: 10.6 g/dL — ABNORMAL LOW (ref 13.0–17.0)
MCH: 32.8 pg (ref 26.0–34.0)
MCH: 31.8 pg (ref 26.0–34.0)
MCHC: 32.8 g/dL (ref 30.0–36.0)
MCHC: 31.1 g/dL (ref 30.0–36.0)
MCV: 100 fL (ref 80.0–100.0)
MCV: 102.4 fL — AB (ref 80.0–100.0)
NRBC: 0 % (ref 0.0–0.2)
PLATELETS: 112 10*3/uL — AB (ref 150–400)
PLATELETS: 128 10*3/uL — AB (ref 150–400)
RBC: 3.11 MIL/uL — AB (ref 4.22–5.81)
RBC: 3.33 MIL/uL — ABNORMAL LOW (ref 4.22–5.81)
RDW: 14.8 % (ref 11.5–15.5)
RDW: 15.1 % (ref 11.5–15.5)
WBC: 5 10*3/uL (ref 4.0–10.5)
WBC: 8.5 10*3/uL (ref 4.0–10.5)
nRBC: 0 % (ref 0.0–0.2)

## 2018-01-05 SURGERY — ESOPHAGOGASTRODUODENOSCOPY (EGD) WITH PROPOFOL
Anesthesia: Monitor Anesthesia Care

## 2018-01-05 MED ORDER — CYCLOBENZAPRINE HCL 5 MG PO TABS
5.0000 mg | ORAL_TABLET | Freq: Once | ORAL | Status: AC
  Administered 2018-01-05: 5 mg via ORAL
  Filled 2018-01-05: qty 1

## 2018-01-05 MED ORDER — PROPOFOL 500 MG/50ML IV EMUL
INTRAVENOUS | Status: DC | PRN
  Administered 2018-01-05: 150 ug/kg/min via INTRAVENOUS

## 2018-01-05 MED ORDER — CYCLOBENZAPRINE HCL 5 MG PO TABS
5.0000 mg | ORAL_TABLET | Freq: Three times a day (TID) | ORAL | Status: DC | PRN
  Administered 2018-01-05: 5 mg via ORAL
  Filled 2018-01-05: qty 1

## 2018-01-05 MED ORDER — ONDANSETRON HCL 4 MG/2ML IJ SOLN
INTRAMUSCULAR | Status: DC | PRN
  Administered 2018-01-05: 4 mg via INTRAVENOUS

## 2018-01-05 MED ORDER — MORPHINE SULFATE (PF) 2 MG/ML IV SOLN: 2.0000 mg | INTRAVENOUS | Status: DC | PRN

## 2018-01-05 MED ORDER — PROPOFOL 10 MG/ML IV BOLUS
INTRAVENOUS | Status: DC | PRN
  Administered 2018-01-05: 20 mg via INTRAVENOUS
  Administered 2018-01-05: 10 mg via INTRAVENOUS

## 2018-01-05 MED FILL — Succinylcholine Chloride Sol Pref Syr 200 MG/10ML (20 MG/ML): INTRAVENOUS | Qty: 10 | Status: AC

## 2018-01-05 MED FILL — Dexamethasone Sod Phosphate Preservative Free Inj 10 MG/ML: INTRAMUSCULAR | Qty: 1 | Status: AC

## 2018-01-05 MED FILL — Phenylephrine-NaCl Pref Syr 0.4 MG/10ML-0.9% (40 MCG/ML): INTRAVENOUS | Qty: 10 | Status: AC

## 2018-01-05 MED FILL — Fentanyl Citrate Preservative Free (PF) Inj 100 MCG/2ML: INTRAMUSCULAR | Qty: 2 | Status: AC

## 2018-01-05 MED FILL — Lidocaine HCl(Cardiac) IV PF Soln Pref Syr 100 MG/5ML (2%): INTRAVENOUS | Qty: 5 | Status: AC

## 2018-01-05 MED FILL — Rocuronium Bromide IV Soln 50 MG/5ML (10 MG/ML): INTRAVENOUS | Qty: 5 | Status: AC

## 2018-01-05 MED FILL — Ephedrine Sulf-NaCl Soln Pref Syr 50 MG/10ML-0.9% (5 MG/ML): INTRAVENOUS | Qty: 10 | Status: AC

## 2018-01-05 MED FILL — Sugammadex Sodium IV 200 MG/2ML (Base Equivalent): INTRAVENOUS | Qty: 2 | Status: AC

## 2018-01-05 MED FILL — Midazolam HCl Inj 2 MG/2ML (Base Equivalent): INTRAMUSCULAR | Qty: 2 | Status: AC

## 2018-01-05 MED FILL — Propofol IV Emul 200 MG/20ML (10 MG/ML): INTRAVENOUS | Qty: 20 | Status: AC

## 2018-01-05 MED FILL — Ondansetron HCl Inj 4 MG/2ML (2 MG/ML): INTRAMUSCULAR | Qty: 2 | Status: AC

## 2018-01-05 MED FILL — Lactated Ringer's Solution: INTRAVENOUS | Qty: 1000 | Status: AC

## 2018-01-05 SURGICAL SUPPLY — 15 items

## 2018-01-05 NOTE — Anesthesia Preprocedure Evaluation (Addendum)
Anesthesia Evaluation  Patient identified by MRN, date of birth, ID band Patient awake    Reviewed: Allergy & Precautions, NPO status , Patient's Chart, lab work & pertinent test results  Airway Mallampati: II  TM Distance: >3 FB Neck ROM: Full    Dental  (+) Teeth Intact, Dental Advisory Given   Pulmonary former smoker,    breath sounds clear to auscultation       Cardiovascular hypertension, Pt. on medications + Peripheral Vascular Disease  + dysrhythmias Atrial Fibrillation  Rhythm:Regular Rate:Normal     Neuro/Psych negative neurological ROS     GI/Hepatic Neg liver ROS, GERD  Medicated,  Endo/Other  negative endocrine ROS  Renal/GU Renal disease     Musculoskeletal negative musculoskeletal ROS (+)   Abdominal Normal abdominal exam  (+)   Peds  Hematology negative hematology ROS (+)   Anesthesia Other Findings   Reproductive/Obstetrics                            Anesthesia Physical Anesthesia Plan  ASA: III  Anesthesia Plan: General   Post-op Pain Management:    Induction: Intravenous  PONV Risk Score and Plan: 3 and Ondansetron and Propofol infusion  Airway Management Planned: Natural Airway and Nasal Cannula  Additional Equipment: None  Intra-op Plan:   Post-operative Plan:   Informed Consent: I have reviewed the patients History and Physical, chart, labs and discussed the procedure including the risks, benefits and alternatives for the proposed anesthesia with the patient or authorized representative who has indicated his/her understanding and acceptance.     Plan Discussed with: CRNA  Anesthesia Plan Comments:        Anesthesia Quick Evaluation

## 2018-01-05 NOTE — Interval H&P Note (Signed)
History and Physical Interval Note:  01/05/2018 11:50 AM  Timothy [name]  has presented today for surgery, with the diagnosis of Coffee-ground emesis.  The various methods of treatment have been discussed with the patient and family. After consideration of risks, benefits and other options for treatment, the patient has consented to  Procedure(s): ESOPHAGOGASTRODUODENOSCOPY (EGD) WITH PROPOFOL (N/A) as a surgical intervention .  The patient's history has been reviewed, patient examined, no change in status, stable for surgery.  I have reviewed the patient's chart and labs.  Questions were answered to the patient's satisfaction.     [name]

## 2018-01-05 NOTE — Evaluation (Signed)
Physical Therapy Evaluation Patient Details Name: Timothy [name] MRN: [id number] DOB: March 07, 1940 Today's Date: 01/05/2018   History of Present Illness  77 year old male who presented with coffee-ground emesis and back pain.  He does have significant past medical history for GERD, hypertension, dyslipidemia and paroxysmal atrial fibrillation.  Clinical Impression  Pt admitted with above diagnosis. Pt currently with functional limitations due to the deficits listed below (see PT Problem List). Pt with tolerable back pain since MRI though still with compromised posture and overactivity L lateral thigh with ambulation. Without RW pt moving with fwd flexed posture and very slow and unsteady. With RW pt was able to stand with more erect stance as well as increase his pace with less pain. Recommend for short term to allow him to mobilize. Recommend oupt PT for spine and will follow acutely.  Pt will benefit from skilled PT to increase their independence and safety with mobility to allow discharge to the venue listed below.       Follow Up Recommendations Outpatient PT    Equipment Recommendations  Rolling walker with 5" wheels    Recommendations for Other Services       Precautions / Restrictions Precautions Precautions: Back Precaution Booklet Issued: No Precaution Comments: pt very familiar with precautions from previous back issues but reviewed again Restrictions Weight Bearing Restrictions: No      Mobility  Bed Mobility Overal bed mobility: Modified Independent             General bed mobility comments: vc's for rolling first  Transfers Overall transfer level: Modified independent Equipment used: None;Rolling walker (2 wheeled)             General transfer comment: stood safely without AD and then vc's given for how to stand to use RW  Ambulation/Gait Ambulation/Gait assistance: Min guard;Modified independent (Device/Increase time) Gait Distance (Feet): 140  Feet Assistive device: Rolling walker (2 wheeled);None Gait Pattern/deviations: Antalgic;Trunk flexed Gait velocity: decreased Gait velocity interpretation: >2.62 ft/sec, indicative of community ambulatory General Gait Details: first ambulated with no AD and pt mildly unsteady with flexed trunk and L quad spasms by the time he sat back down. Second ambulation also 48' with RW and more erct posture, more steady gait, faster gait, and less L thigh discomfort during and after  Stairs            Wheelchair Mobility    Modified Rankin (Stroke Patients Only)       Balance Overall balance assessment: Mild deficits observed, not formally tested                                           Pertinent Vitals/Pain Pain Assessment: Faces Faces Pain Scale: Hurts even more Pain Location: L thigh with ambulation Pain Descriptors / Indicators: Cramping Pain Intervention(s): Limited activity within patient's tolerance;Monitored during session    Home Living Family/patient expects to be discharged to:: Private residence Living Arrangements: Spouse/significant other Available Help at Discharge: Family;Available 24 hours/day Type of Home: House Home Access: Stairs to enter   [employer] of Steps: 1 Home Layout: One level Home Equipment: None      Prior Function Level of Independence: Independent         Comments: pt is a retired Software engineer, likes to work in yard but has people lined up to do that for him     [employer]  Extremity/Trunk Assessment   Upper Extremity Assessment Upper Extremity Assessment: Overall WFL for tasks assessed    Lower Extremity Assessment Lower Extremity Assessment: LLE deficits/detail LLE Deficits / Details: hyperactivity L lateral quad with activity and erect posture. Strength WFL LLE Sensation: (hyperresponsive) LLE Coordination: WNL    Cervical / Trunk Assessment Cervical / Trunk Assessment:  Kyphotic;Other exceptions Cervical / Trunk Exceptions: decreased lumbar lordosis  Communication   Communication: No difficulties  Cognition Arousal/Alertness: Awake/alert Behavior During Therapy: WFL for tasks assessed/performed Overall Cognitive Status: Within Functional Limits for tasks assessed                                        General Comments General comments (skin integrity, edema, etc.): discussed appropriate activities for exercise and avoidance of all flexion activities    Exercises     Assessment/Plan    PT Assessment Patient needs continued PT services  PT Problem List Decreased activity tolerance;Decreased mobility;Decreased knowledge of use of DME;Pain;Impaired tone       PT Treatment Interventions DME instruction;Gait training;Stair training;Functional mobility training;Therapeutic activities;Therapeutic exercise;Patient/family education;Neuromuscular re-education    PT Goals (Current goals can be found in the Care Plan section)  Acute Rehab PT Goals Patient Stated Goal: return to PLOF and no surgery PT Goal Formulation: With patient Time For Goal Achievement: 01/19/18 Potential to Achieve Goals: Good    Frequency Min 3X/week   Barriers to discharge        Co-evaluation               AM-PAC PT "6 Clicks" Daily Activity  Outcome Measure Difficulty turning over in bed (including adjusting bedclothes, sheets and blankets)?: None Difficulty moving from lying on back to sitting on the side of the bed? : None Difficulty sitting down on and standing up from a chair with arms (e.g., wheelchair, bedside commode, etc,.)?: A Little Help needed moving to and from a bed to chair (including a wheelchair)?: A Little Help needed walking in hospital room?: A Little Help needed climbing 3-5 steps with a railing? : A Lot 6 Click Score: 19    End of Session   Activity Tolerance: Patient tolerated treatment well Patient left: in bed;with call  bell/phone within reach;with family/visitor present Nurse Communication: Mobility status PT Visit Diagnosis: Unsteadiness on feet (R26.81);Pain;Difficulty in walking, not elsewhere classified (R26.2) Pain - Right/Left: Left Pain - part of body: Leg    Time: 1539-1610 PT Time Calculation (min) (ACUTE ONLY): 31 min   Charges:   PT Evaluation $PT Eval Moderate Complexity: 1 Mod PT Treatments $Gait Training: 8-22 mins        Leighton Roach, PT  Acute Rehab Services  Pager [phone] Office [place] 01/05/2018, 5:23 PM

## 2018-01-05 NOTE — Op Note (Signed)
[facility] Patient Name: Timothy [name] Procedure Date : 01/05/2018 MRN: [id number] Attending MD: [name] , MD Date of Birth: [birth year] CSN: [id number] Age: 77 Admit Type: Inpatient Procedure:                Upper GI endoscopy Indications:              Coffee-ground emesis Providers:                [name], MD, Cleda Daub, RN, William Dalton, Technician Referring MD:              Medicines:                Monitored Anesthesia Care Complications:            No immediate complications. Estimated Blood Loss:     Estimated blood loss was minimal. Procedure:                Pre-Anesthesia Assessment:                           - Prior to the procedure, a History and Physical                            was performed, and patient medications and                            allergies were reviewed. The patient's tolerance of                            previous anesthesia was also reviewed. The risks                            and benefits of the procedure and the sedation                            options and risks were discussed with the patient.                            All questions were answered, and informed consent                            was obtained. Prior Anticoagulants: The patient has                            taken Eliquis (apixaban), last dose was 5 days                            prior to procedure. ASA Grade Assessment: II - A                            patient with mild systemic disease. After reviewing  the risks and benefits, the patient was deemed in                            satisfactory condition to undergo the procedure.                           After obtaining informed consent, the endoscope was                            passed under direct vision. Throughout the                            procedure, the patient's blood pressure, pulse, and                            oxygen  saturations were monitored continuously. The                            [id number]-[id number] ([id number]) Olympus Adult EGD was introduced                            through the mouth, and advanced to the second part                            of duodenum. The upper GI endoscopy was                            accomplished without difficulty. The patient                            tolerated the procedure well. Scope In: Scope Out: Findings:      The examined esophagus was normal. No esophageal varices.      A few 6 to 8 mm sessile polyps found in the cardia and in the gastric       body. One polyp in the cardia just below the GE junction, best       visualized on the retroflexed examination started oozing. It had a small       ulceration. For hemostasis, three hemostatic clips were successfully       placed (MR conditional). There was no bleeding at the end of the       procedure. No obvious fundal varices. Doubt dieulafoy lesion.      The duodenum was normal. Incidental note was made of 2 small submucosal       polyps in the first portion of the duodenum. Estimated blood loss was       minimal. Impression:               - Gastric polyps with one actively oozing polyp in                            the cardia of the stomach s/p endocopic hemostatic                            clips. Recommendation:           -  Return patient to hospital ward for ongoing care.                           - No aspirin, ibuprofen, naproxen, or other                            non-steroidal anti-inflammatory drugs.                           - Continue present medications.                           - Repeat upper endoscopy in 4-6 weeks to reevaluate.                           - CTA abdomen (r/o underlying fundal varices or any                            vascular abnormalities- I doubt it)                           - Recheck CBC in a.m.                           - Protonix 40 mg IV every 12 hours for now. Needed                             discharge, he would need Protonix 40 mg by mouth                            once a day.                           - Will determine timing of Eliquis depending upon                            the above CTA                           - Return to GI clinic. Procedure Code(s):        --- Professional ---                           [phone], Esophagogastroduodenoscopy, flexible,                            transoral; with control of bleeding, any method Diagnosis Code(s):        --- Professional ---                           K31.7, Polyp of stomach and duodenum                           K92.0, Hematemesis CPT copyright 2018 [employer]. All rights reserved. The codes documented in this report are preliminary and upon coder  review may  be revised to meet current compliance requirements. [name], MD 01/05/2018 12:38:39 PM This report has been signed electronically. Number of Addenda: 0

## 2018-01-05 NOTE — Progress Notes (Addendum)
Patient Demographics:    Timothy [name], is a 77 y.o. male, DOB - 17-Jul-1940, TIR:[id number]  Admit date - 01/02/2018   Admitting Physician Shela Leff, MD  Outpatient Primary MD for the patient is Crist Infante, MD  LOS - 1   Chief Complaint  Patient presents with  . Hematemesis        Subjective:    [name] today has no fevers, No chest pain, wife at bedside, questions answered, back pain persist, dyspnea on exertion persist,   Assessment  & Plan :    Principal Problem:   Upper GI bleed Active Problems:   Mixed hyperlipidemia   PAF (paroxysmal atrial fibrillation) (HCC)   HLD (hyperlipidemia)   GERD (gastroesophageal reflux disease)   Lumbar back pain   Coffee ground emesis   AKI (acute kidney injury) ([place])   Leukocytosis   Gout   GI bleed  Brief Narrative:  77 year old male with history of GERD, dyslipidemia, hypertension and paroxysmal A. fib on Eliquis admitted on 01/02/2018 with coffee-ground emesis and found to have acute blood loss anemia , also had severe back pain.    Plan:- 1)Acute Gi Bleed--hemoglobin is down to 10.2 from 13.3, EGD on 01/05/2018 with actively oozing gastric polyp, status post endoscopic hemostatic clips, CTA abdomen is pending (r/o underlying fundal varices or any vascular abnormalities),  suspect upper GI bleed secondary to steroid use in the patient who is chronically anticoagulated on apixaban, last dose of apixaban 01/01/2018,   follow H&H and transfuse as clinically indicated, please await GI recommendations as to the timing of possible restart of Eliquis, continue IV Protonix, liquid diet  2)LBP-back pain is very severe, patient continues to require IV narcotics/pain meds, MRi with new large central and left HNP L3-4 . Patient is considering possible ESI may be as outpatient, orthopedic surgeon Dr. 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   creatinine on admission= 2.1 ,   baseline creatinine = 1.6   , creatinine is now= 1.4   , renally adjust medications, avoid nephrotoxic agents/dehydration/hypotension  Dispo--- consider holding Eliquis post discharge for wall as patient may want to try ESI as outpatient for his herniated disc  Disposition/Need for in-Hospital Stay- patient unable to be discharged at this time due to acute symptomatic GI bleed requiring further investigation prior to restarting anticoagulation-  fatigue and dizziness persist ,  , low back pain requiring  IV pain medication/aquatic, continues to have some dyspnea on exertion, hemoglobin is dropping (10.2 from 13.3)  Code Status : Full   Disposition Plan  : home  Consults  :  Gi/Ortho/IR   DVT Prophylaxis  : SCDs/apixaban hold since 01/01/2018  Lab Results  Component Value  Date   PLT 128 (L) 01/05/2018   Inpatient Medications  Scheduled Meds: . acetaminophen  650 mg Oral Q6H  . gabapentin  300 mg Oral TID  . pantoprazole (PROTONIX) IV  40 mg Intravenous Q12H   Continuous Infusions: . dextrose 5 % and 0.9% NaCl 75 mL/hr at 01/05/18 0421  . lactated ringers 10 mL/hr at 01/04/18 0843   PRN Meds:.cyclobenzaprine, morphine injection, ondansetron (ZOFRAN) IV, oxyCODONE-acetaminophen   Anti-infectives (From admission, onward)   None       Objective:   Vitals:   01/05/18 1225 01/05/18 1235 01/05/18 1245 01/05/18 1400  BP: (!) 114/48 (!) 126/57 (!) 145/55 (!) 159/68  Pulse: (!)  48 (!) 58 (!) 59 (!) 58  Resp: (!) 7 19 15 16   Temp: 97.6 F (36.4 C)   97.7 F (36.5 C)  TempSrc: Oral   Axillary  SpO2: 100% 100% 98% 98%  Weight:      Height:        Wt Readings from Last 3 Encounters:  01/03/18 69.6 kg  04/24/17 74.8 kg  04/01/17 75.3 kg    Intake/Output Summary (Last 24 hours) at 01/05/2018 1711 Last data filed at 01/05/2018 1219 Gross per 24 hour  Intake 700 ml  Output 350 ml  Net 350 ml   Physical Exam Patient is examined daily including today on 01/05/18 , exams remain the same as of yesterday except that has changed   Gen:- Awake Alert,  In no apparent distress  HEENT:- [place].AT, No sclera icterus Neck-Supple Neck,No JVD,.  Lungs-  CTAB , fair symmetrical air movement CV- S1, S2 normal, regular (h/o AFib) Abd-  +ve B.Sounds, Abd Soft, No tenderness,    Extremity/Skin:- No  edema, pedal pulses present  Psych-affect is appropriate, oriented x3 Neuro-no new focal deficits, no tremors   Data Review:   Micro Results No results found for this or any previous visit (from the past 240 hour(s)).  Radiology Reports Mr Lumbar Spine Wo Contrast  Result Date: 01/04/2018 CLINICAL DATA:  Low back pain.  No known injury EXAM: MRI LUMBAR SPINE WITHOUT CONTRAST TECHNIQUE: Multiplanar, multisequence MR imaging of the lumbar spine was performed. No intravenous contrast was administered. COMPARISON:  None. FINDINGS: Segmentation:  Standard. Alignment: Minimal retrolisthesis of L3 on L4. Grade 1 anterolisthesis of L4 on L5 secondary to facet disease. Vertebrae:  No fracture, evidence of discitis, or bone lesion. Conus medullaris and cauda equina: Conus extends to the L1 level. Conus and cauda equina appear normal. Paraspinal and other soft tissues: No acute paraspinal abnormality. Small left renal cyst. Disc levels: Disc spaces: Degenerative disc disease with disc height loss at L5-S1. Disc desiccation at L1-2, L3-4 and L4-5. T12-L1: No significant disc bulge. No  evidence of neural foraminal stenosis. No central canal stenosis. L1-L2: Mild broad-based disc bulge. Mild bilateral facet arthropathy. Mild spinal stenosis. No evidence of neural foraminal stenosis. L2-L3: Broad-based disc bulge. Moderate bilateral facet arthropathy with bilateral facet effusions. Mild spinal stenosis and bilateral lateral recess stenosis. Mild bilateral foraminal stenosis. L3-L4: Broad-based disc bulge with a central broad disc extrusion and left paracentral cranial migration of disc material along the L3 vertebral body with mass effect on the left intraspinal L4 nerve root and possibly contacting the left L3 nerve root. Moderate left foraminal stenosis. No right foraminal stenosis. Moderate bilateral facet arthropathy with facet effusions. Moderate-severe spinal stenosis. L4-L5: Broad-based disc bulge. Severe bilateral facet arthropathy. Moderate spinal stenosis and bilateral lateral recess stenosis. Mild bilateral foraminal stenosis. L5-S1: Broad-based  disc bulge. Moderate bilateral facet arthropathy. Mild bilateral foraminal stenosis. No central canal stenosis. IMPRESSION: 1. At L3-4 there is a broad-based disc bulge with a central broad disc extrusion diffuse lumbar spine spondylosis as described above. And left paracentral cranial migration of disc material along the L3 vertebral body with mass effect on the left intraspinal L4 nerve root and possibly contacting the left L3 nerve root. Moderate left foraminal stenosis. No right foraminal stenosis. Moderate bilateral facet arthropathy with facet effusions. Moderate-severe spinal stenosis. 2. Electronically Signed   By: Kathreen Devoid   On: 01/04/2018 11:46   Mr Lumbar Spine Wo Contrast  Result Date: 01/02/2018 CLINICAL DATA:  Back pain radiating to left leg. EXAM: MRI LUMBAR SPINE WITHOUT CONTRAST TECHNIQUE: Multiplanar, multisequence MR imaging of the lumbar spine was performed. No intravenous contrast was administered. COMPARISON:  Lumbar  spine radiograph 12/24/2017 FINDINGS: The examination was discontinued before completion. There is grade 1 anterolisthesis at L4-L5. The examination otherwise shows normal alignment without evidence of compression fracture. IMPRESSION: Grade 1 anterolisthesis at L4-L5. Otherwise, discontinued examination with limited diagnostic information. Electronically Signed   By: Ulyses Jarred M.D.   On: 01/02/2018 18:18   Xr Lumbar Spine 2-3 Views  Result Date: 12/24/2017 X-rays of the lumbar spine reveal abnormal straightening.  He does appear to have a retrolisthesis at L5 with significant degenerative disc disease L5-S1.   CBC Recent Labs  Lab 01/02/18 1401 01/02/18 1701 01/03/18 0843 01/05/18 0410 01/05/18 1428  WBC 12.2* 11.2*  --  5.0 8.5  HGB 13.4 13.3 12.3* 10.2* 10.6*  HCT 40.2 40.3 38.1* 31.1* 34.1*  PLT 153 166  --  112* 128*  MCV 100.5* 101.5*  --  100.0 102.4*  MCH 33.5 33.5  --  32.8 31.8  MCHC 33.3 33.0  --  32.8 31.1  RDW 15.5 15.8*  --  14.8 15.1    Chemistries  Recent Labs  Lab 01/02/18 1401 01/03/18 0442 01/05/18 0410  NA 134* 136 132*  K 4.2 4.6 4.6  CL 98 101 103  CO2 26 28 23   GLUCOSE 126* 101* 133*  BUN 75* 54* 23  CREATININE 2.13* 1.85* 1.46*  CALCIUM 9.5 8.6* 8.3*  AST 14*  --  17  ALT 15  --  13  ALKPHOS 67  --  57  BILITOT 0.9  --  0.6  ------------------------------------------------------------------------------------------------------------------ No results for input(s): CHOL, HDL, LDLCALC, TRIG, CHOLHDL, LDLDIRECT in the last 72 hours.  No results found for: HGBA1C ------------------------------------------------------------------------------------------------------------------ No results for input(s): TSH, T4TOTAL, T3FREE, THYROIDAB in the last 72 hours.  Invalid input(s): FREET3 ------------------------------------------------------------------------------------------------------------------ No results for input(s): VITAMINB12, FOLATE,  FERRITIN, TIBC, IRON, RETICCTPCT in the last 72 hours.  Coagulation profile Recent Labs  Lab 01/02/18 1401  INR 1.21   No results for input(s): DDIMER in the last 72 hours.  Cardiac Enzymes No results for input(s): CKMB, TROPONINI, MYOGLOBIN in the last 168 hours.  Invalid input(s): CK ------------------------------------------------------------------------------------------------------------------ No results found for: BNP  Roxan Hockey M.D on 01/05/2018 at 5:11 PM  Pager---[phone] Go to [email or web address] - password [email or web address] for contact info  [facility] - Office  [phone]

## 2018-01-05 NOTE — Anesthesia Procedure Notes (Signed)
Procedure Name: MAC Date/Time: 01/05/2018 11:54 AM Performed by: Wilburn Cornelia, CRNA Pre-anesthesia Checklist: Patient identified, Emergency Drugs available, Suction available, Patient being monitored and Timeout performed Patient Re-evaluated:Patient Re-evaluated prior to induction Oxygen Delivery Method: Nasal cannula Induction Type: IV induction Placement Confirmation: positive ETCO2 and breath sounds checked- equal and bilateral Dental Injury: Teeth and Oropharynx as per pre-operative assessment

## 2018-01-05 NOTE — Anesthesia Postprocedure Evaluation (Signed)
Anesthesia Post Note  Patient: Timothy [name]  Procedure(s) Performed: ESOPHAGOGASTRODUODENOSCOPY (EGD) WITH PROPOFOL (N/A ) HEMOSTASIS CLIP PLACEMENT     Patient location during evaluation: PACU Anesthesia Type: MAC Level of consciousness: awake and alert Pain management: pain level controlled Vital Signs Assessment: post-procedure vital signs reviewed and stable Respiratory status: spontaneous breathing, nonlabored ventilation, respiratory function stable and patient connected to nasal cannula oxygen Cardiovascular status: stable and blood pressure returned to baseline Postop Assessment: no apparent nausea or vomiting Anesthetic complications: no    Last Vitals:  Vitals:   01/05/18 1235 01/05/18 1245  BP: (!) 126/57 (!) 145/55  Pulse: (!) 58 (!) 59  Resp: 19 15  Temp:    SpO2: 100% 98%    Last Pain:  Vitals:   01/05/18 1245  TempSrc:   PainSc: 0-No pain                 Effie Berkshire

## 2018-01-05 NOTE — Progress Notes (Signed)
  Interventional radiology does not have capacity to perform Epidural steroid injection on Monday afternoon. This was discussed with patient and his family yesterday. Today underwent endoscopy so  ESI appropriateness is such that I would not recommend a steriod injection about the time of  Endoscopy. Timothy [name] had much less pain in the back and left leg following the Lumbar MRI and he is considering no ESI and no surgery as he is tolerating the sciatica and back pain much better.  I will follow while in hospital but recomment PT and OT.

## 2018-01-05 NOTE — Transfer of Care (Signed)
Immediate Anesthesia Transfer of Care Note  Patient: Timothy [name]  Procedure(s) Performed: ESOPHAGOGASTRODUODENOSCOPY (EGD) WITH PROPOFOL (N/A ) HEMOSTASIS CLIP PLACEMENT  Patient Location: Endoscopy Unit  Anesthesia Type:MAC  Level of Consciousness: awake, alert  and oriented  Airway & Oxygen Therapy: Patient Spontanous Breathing and Patient connected to nasal cannula oxygen  Post-op Assessment: Report given to RN and Post -op Vital signs reviewed and stable  Post vital signs: Reviewed and stable  Last Vitals:  Vitals Value Taken Time  BP 114/48 01/05/2018 12:25 PM  Temp    Pulse 53 01/05/2018 12:29 PM  Resp 14 01/05/2018 12:29 PM  SpO2 100 % 01/05/2018 12:29 PM  Vitals shown include unvalidated device data.  Last Pain:  Vitals:   01/05/18 1038  TempSrc: Oral  PainSc: 0-No pain      Patients Stated Pain Goal: 7 (32/99/24 2683)  Complications: No apparent anesthesia complications

## 2018-01-06 ENCOUNTER — Inpatient Hospital Stay (HOSPITAL_COMMUNITY): Payer: Medicare Other

## 2018-01-06 ENCOUNTER — Encounter (HOSPITAL_COMMUNITY): Payer: Self-pay | Admitting: Gastroenterology

## 2018-01-06 DIAGNOSIS — N179 Acute kidney failure, unspecified: Secondary | ICD-10-CM

## 2018-01-06 DIAGNOSIS — E785 Hyperlipidemia, unspecified: Secondary | ICD-10-CM

## 2018-01-06 DIAGNOSIS — I480 Paroxysmal atrial fibrillation: Secondary | ICD-10-CM

## 2018-01-06 DIAGNOSIS — M545 Low back pain: Secondary | ICD-10-CM

## 2018-01-06 DIAGNOSIS — D72829 Elevated white blood cell count, unspecified: Secondary | ICD-10-CM

## 2018-01-06 LAB — COMPREHENSIVE METABOLIC PANEL
ALT: 15 U/L (ref 0–44)
AST: 16 U/L (ref 15–41)
Albumin: 2.5 g/dL — ABNORMAL LOW (ref 3.5–5.0)
Alkaline Phosphatase: 50 U/L (ref 38–126)
Anion gap: 5 (ref 5–15)
BUN: 20 mg/dL (ref 8–23)
CHLORIDE: 110 mmol/L (ref 98–111)
CO2: 23 mmol/L (ref 22–32)
CREATININE: 1.38 mg/dL — AB (ref 0.61–1.24)
Calcium: 8.3 mg/dL — ABNORMAL LOW (ref 8.9–10.3)
GFR calc Af Amer: 56 mL/min — ABNORMAL LOW (ref >60)
GFR calc non Af Amer: 48 mL/min — ABNORMAL LOW (ref >60)
Glucose, Bld: 89 mg/dL (ref 70–99)
POTASSIUM: 4.5 mmol/L (ref 3.5–5.1)
SODIUM: 138 mmol/L (ref 135–145)
Total Bilirubin: 0.7 mg/dL (ref 0.3–1.2)
Total Protein: 5.4 g/dL — ABNORMAL LOW (ref 6.5–8.1)

## 2018-01-06 LAB — CBC
HCT: 30.4 % — ABNORMAL LOW (ref 39.0–52.0)
Hemoglobin: 9.6 g/dL — ABNORMAL LOW (ref 13.0–17.0)
MCH: 31.9 pg (ref 26.0–34.0)
MCHC: 31.6 g/dL (ref 30.0–36.0)
MCV: 101 fL — ABNORMAL HIGH (ref 80.0–100.0)
NRBC: 0 % (ref 0.0–0.2)
PLATELETS: 122 10*3/uL — AB (ref 150–400)
RBC: 3.01 MIL/uL — AB (ref 4.22–5.81)
RDW: 15.4 % (ref 11.5–15.5)
WBC: 5.2 10*3/uL (ref 4.0–10.5)

## 2018-01-06 LAB — HEMOGLOBIN AND HEMATOCRIT, BLOOD
HCT: 33.4 % — ABNORMAL LOW (ref 39.0–52.0)
Hemoglobin: 10.9 g/dL — ABNORMAL LOW (ref 13.0–17.0)

## 2018-01-06 MED ORDER — PANTOPRAZOLE SODIUM 40 MG PO TBEC
40.0000 mg | DELAYED_RELEASE_TABLET | Freq: Every day | ORAL | Status: DC
  Administered 2018-01-07: 40 mg via ORAL
  Filled 2018-01-06: qty 1

## 2018-01-06 NOTE — Progress Notes (Addendum)
Daily Rounding Note  01/06/2018, 10:03 AM  LOS: 2 days   SUBJECTIVE:   Chief complaint:  CGE.   Last black stool was early Saturday morning.  No nausea or vomiting.  Not dizzy.  Has not been up out of bed much because of the IV connections and the pressure stockings.  OBJECTIVE:         Vital signs in last 24 hours:    Temp:  [97.6 F (36.4 C)-98.3 F (36.8 C)] 98.1 F (36.7 C) (11/13 0526) Pulse Rate:  [48-65] 50 (11/13 0526) Resp:  [7-19] 16 (11/13 0526) BP: (114-169)/(48-83) 130/54 (11/13 0526) SpO2:  [97 %-100 %] 97 % (11/13 0526) Last BM Date: 01/04/18 Filed Weights   01/03/18 1842  Weight: 69.6 kg   General: Looks well. Heart: RRR.  Monitor shows sinus rhythm in the 60s. Chest: Clear bilaterally.  No dyspnea.  No cough Abdomen: Nondistended, nontender.  Active bowel sounds. Extremities: CCE. Neuro/Psych: X3.  Alert.  No gross weakness.  No tremors.  Intake/Output from previous day: 11/12 0701 - 11/13 0700 In: 700 [I.V.:700] Out: -   Intake/Output this shift: No intake/output data recorded.  Lab Results: Recent Labs    01/05/18 0410 01/05/18 1428 01/06/18 0345  WBC 5.0 8.5 5.2  HGB 10.2* 10.6* 9.6*  HCT 31.1* 34.1* 30.4*  PLT 112* 128* 122*   BMET Recent Labs    01/05/18 0410 01/06/18 0345  NA 132* 138  K 4.6 4.5  CL 103 110  CO2 23 23  GLUCOSE 133* 89  BUN 23 20  CREATININE 1.46* 1.38*  CALCIUM 8.3* 8.3*   LFT Recent Labs    01/05/18 0410 01/06/18 0345  PROT 5.8* 5.4*  ALBUMIN 2.6* 2.5*  AST 17 16  ALT 13 15  ALKPHOS 57 50  BILITOT 0.6 0.7   PT/INR No results for input(s): LABPROT, INR in the last 72 hours. Hepatitis Panel No results for input(s): HEPBSAG, HCVAB, HEPAIGM, HEPBIGM in the last 72 hours.  Studies/Results: Mr Lumbar Spine Wo Contrast  Result Date: 01/04/2018 CLINICAL DATA:  Low back pain.  No known injury EXAM: MRI LUMBAR SPINE WITHOUT CONTRAST  TECHNIQUE: Multiplanar, multisequence MR imaging of the lumbar spine was performed. No intravenous contrast was administered. COMPARISON:  None. FINDINGS: Segmentation:  Standard. Alignment: Minimal retrolisthesis of L3 on L4. Grade 1 anterolisthesis of L4 on L5 secondary to facet disease. Vertebrae:  No fracture, evidence of discitis, or bone lesion. Conus medullaris and cauda equina: Conus extends to the L1 level. Conus and cauda equina appear normal. Paraspinal and other soft tissues: No acute paraspinal abnormality. Small left renal cyst. Disc levels: Disc spaces: Degenerative disc disease with disc height loss at L5-S1. Disc desiccation at L1-2, L3-4 and L4-5. T12-L1: No significant disc bulge. No evidence of neural foraminal stenosis. No central canal stenosis. L1-L2: Mild broad-based disc bulge. Mild bilateral facet arthropathy. Mild spinal stenosis. No evidence of neural foraminal stenosis. L2-L3: Broad-based disc bulge. Moderate bilateral facet arthropathy with bilateral facet effusions. Mild spinal stenosis and bilateral lateral recess stenosis. Mild bilateral foraminal stenosis. L3-L4: Broad-based disc bulge with a central broad disc extrusion and left paracentral cranial migration of disc material along the L3 vertebral body with mass effect on the left intraspinal L4 nerve root and possibly contacting the left L3 nerve root. Moderate left foraminal stenosis. No right foraminal stenosis. Moderate bilateral facet arthropathy with facet effusions. Moderate-severe spinal stenosis. L4-L5: Broad-based disc bulge. Severe bilateral  facet arthropathy. Moderate spinal stenosis and bilateral lateral recess stenosis. Mild bilateral foraminal stenosis. L5-S1: Broad-based disc bulge. Moderate bilateral facet arthropathy. Mild bilateral foraminal stenosis. No central canal stenosis. IMPRESSION: 1. At L3-4 there is a broad-based disc bulge with a central broad disc extrusion diffuse lumbar spine spondylosis as  described above. And left paracentral cranial migration of disc material along the L3 vertebral body with mass effect on the left intraspinal L4 nerve root and possibly contacting the left L3 nerve root. Moderate left foraminal stenosis. No right foraminal stenosis. Moderate bilateral facet arthropathy with facet effusions. Moderate-severe spinal stenosis. 2. Electronically Signed   By: Kathreen Devoid   On: 01/04/2018 11:46   Scheduled Meds: . acetaminophen  650 mg Oral Q6H  . gabapentin  300 mg Oral TID  . pantoprazole (PROTONIX) IV  40 mg Intravenous Q12H   Continuous Infusions: . lactated ringers 50 mL/hr at 01/05/18 1744   PRN Meds:.cyclobenzaprine, morphine injection, ondansetron (ZOFRAN) IV, oxyCODONE-acetaminophen  ASSESMENT:   *    CGE.  Recent course of prednisone and NSAIDs for back pain.   01/05/18 EGD: gastric polyps, one actively oozing blood treated with endoclipping Day 3 Protonix 40 mg IV BID.  CTA angio ordered but not yet completed, pt refused and Dr Deterding does not want pt to receive any IV contrast.     *   Blood loss anemia.  1 gm drop Hgb in last 14 hours, overall drop 3.5 gm.    *   Afib, chronic Eliquis on hold.    *   Lumbar spine disease with radiculopathy.  Back and left leg pain improved.  No surgery/intervention per Dr Louanne Skye, orthopedics.    *   Thrombocytopenia. Non-critical.    *   AKI.  Improved.     *   Hyponatremia, resolved.    *   Radiation proctitis 2013, hx radiation for proatate cancer in 2010.     PLAN   *   Plan OV in 2 to 3 weeks and EGD repeat in 4 to 6 weeks (Dr Henrene Pastor).  Also it has been 12 years (09/2004) since last, unremarkable, avg risk screening colonoscopy so may want to consider pursuing another at time of EGD.    *   Restart date for daily Eliquis Saturday 11/16.  *  HH diet.    *   Repeat Hgb set for this afternoon.  CBC in AM.    *   Stop IVF and convert to po Protonix.    *   Doppler ultrasound to assess for portal  htn underlying the bleeding gastric polyp.  Pt ok with this.      [name]  01/06/2018, 10:03 AM Phone [phone]   Attending physician's note   I have taken an interval history, reviewed the chart and examined the patient. I agree with the Advanced Practitioner's note, impression and recommendations.   Patient with upper GI bleeding due to gastric polyp in the [place] with active oozing status post endoscopic clipping. CTA ordered but canceled as per nephrology (Dr Deterding's recommendations).  No further bleeding.  Hemoglobin more or less stable. Doppler ultrasound for today rule out portal hypertension.  Plan: Protonix 40 mg p.o. once a day, trend CBC, Doppler ultrasound today, restart Eliquis in 72 hours 11/16, repeat EGD in 4 to 6 weeks with screening colonoscopy.  Avoid nonsteroidals.  Carmell Austria, MD

## 2018-01-06 NOTE — Progress Notes (Signed)
Physical Therapy Treatment Patient Details Name: Timothy [name] MRN: [id number] DOB: 07/01/1940 Today's Date: 01/06/2018    History of Present Illness Pt is a 77 year old male who presented with coffee-ground emesis and back pain.  He does have significant past medical history for GERD, hypertension, dyslipidemia and paroxysmal atrial fibrillation.    PT Comments    Pt making steady progress with functional mobility. He demonstrated good log roll technique for bed mobility and safety with transfers with use of RW. PT discussed a generalized walking program with pt and spouse for pt to initiate upon d/c home (gradually building up endurance and tolerance to ambulation, increasing amount of time). Pt would continue to benefit from skilled physical therapy services at this time while admitted and after d/c to address the below listed limitations in order to improve overall safety and independence with functional mobility.    Follow Up Recommendations  Outpatient PT     Equipment Recommendations  Rolling walker with 5" wheels    Recommendations for Other Services       Precautions / Restrictions Precautions Precautions: Back Precaution Booklet Issued: No Precaution Comments: pt very familiar with precautions from previous back issues but reviewed again Restrictions Weight Bearing Restrictions: No    Mobility  Bed Mobility Overal bed mobility: Modified Independent                Transfers Overall transfer level: Modified independent                  Ambulation/Gait Ambulation/Gait assistance: Supervision Gait Distance (Feet): 100 Feet Assistive device: Rolling walker (2 wheeled) Gait Pattern/deviations: Decreased stride length Gait velocity: decreased   General Gait Details: pt with good posture with use of RW, steady with no LOB or need for physical assistance, supervision for safety   Stairs             Wheelchair Mobility    Modified Rankin  (Stroke Patients Only)       Balance Overall balance assessment: Mild deficits observed, not formally tested                                          Cognition Arousal/Alertness: Awake/alert Behavior During Therapy: WFL for tasks assessed/performed Overall Cognitive Status: Within Functional Limits for tasks assessed                                        Exercises      General Comments        Pertinent Vitals/Pain Pain Assessment: 0-10 Pain Score: 1  Pain Location: L hip and low back Pain Descriptors / Indicators: Sore Pain Intervention(s): Monitored during session    Home Living                      Prior Function            PT Goals (current goals can now be found in the care plan section) Acute Rehab PT Goals PT Goal Formulation: With patient Time For Goal Achievement: 01/19/18 Potential to Achieve Goals: Good Progress towards PT goals: Progressing toward goals    Frequency    Min 3X/week      PT Plan Current plan remains appropriate    Co-evaluation  AM-PAC PT "6 Clicks" Daily Activity  Outcome Measure  Difficulty turning over in bed (including adjusting bedclothes, sheets and blankets)?: None Difficulty moving from lying on back to sitting on the side of the bed? : None Difficulty sitting down on and standing up from a chair with arms (e.g., wheelchair, bedside commode, etc,.)?: A Little Help needed moving to and from a bed to chair (including a wheelchair)?: None Help needed walking in hospital room?: None Help needed climbing 3-5 steps with a railing? : A Little 6 Click Score: 22    End of Session   Activity Tolerance: Patient limited by pain Patient left: in bed;with call bell/phone within reach;with family/visitor present Nurse Communication: Mobility status PT Visit Diagnosis: Other abnormalities of gait and mobility (R26.89);Pain Pain - Right/Left: Left Pain - part of body:  Hip     Time: 4076-8088 PT Time Calculation (min) (ACUTE ONLY): 13 min  Charges:  $Gait Training: 8-22 mins                     Sherie Don, [place], DPT  Acute Rehabilitation Services Pager [phone] Office [place] 01/06/2018, 4:46 PM

## 2018-01-06 NOTE — Progress Notes (Signed)
Patient ID: Timothy [name], male   DOB: Aug 22, 1940, 77 y.o.   MRN: [id number] Pain left thigh is manageable but there is dense numbness left anterior thigh. He is able to stand and walk with PT assistance. He is reconsidering undergoing a left L3-4 ESI.  RSLR is positive. Motor foot DF is 5/5, Left knee extension 5-/5 I will talk to interventional radiology and consider for an ESI left L3-4. This can be done and the patient safe to be discharged home post 1-2 hours following surgery.  I called and spoke with the interventionalist on call, Dr. Vernard Gambles and he is favorable that this may be done tomorrow.  NPO past midnight for [facility] tomorrow.

## 2018-01-06 NOTE — Progress Notes (Signed)
PT Cancellation Note  Patient Details Name: Timothy [name] MRN: [id number] DOB: 08/29/1940   Cancelled Treatment:    Reason Eval/Treat Not Completed: Patient at procedure or test/unavailable. Pt off of the floor for testing. PT will continue to follow acutely as available.    [name] 01/06/2018, 3:41 PM

## 2018-01-06 NOTE — Progress Notes (Signed)
PROGRESS NOTE    Timothy [name]  HGD:[id number] DOB: February 12, 1941 DOA: 01/02/2018 PCP: Crist Infante, MD   Brief Narrative:  HPI On 01/02/2018 by Dr. Otto Herb Timothy [name] is a 77 y.o. male with medical history significant of GERD hypertension, hyperlipidemia, paroxysmal A. fib on Eliquis presenting to the hospital with back pain as well as episodes of coffee-ground emesis throughout the day.  Patient was somnolent secondary to receiving a dose of benzodiazepine for MRI.  History provided by wife at bedside.  Wife states patient has been complaining of severe lumbar back pain for the past 2 weeks.  He was seen at orthopedic office on October 31 and plan was to do an MRI today.  Patient takes Eliquis for A. fib and was recently treated with 2 rounds of prednisone taper for his back pain.  States today he woke up nauseous and has vomited 4 times throughout the day.  Wife describes it as large volume "dark, black, grainy" vomit.  States he was unable to get his MRI done outpatient today due to vomiting. States he has not been eating much for the last few days secondary to his back pain.  Patient denies having any abdominal pain.  Denies having any weakness in his legs.  Denies having any bowel/bladder incontinence or saddle anesthesia.  Denies having any cough or shortness of breath.  Denies having any dysuria, urinary frequency, or urgency.  Assessment & Plan   Acute Upper GI bleeding/ Acute blood loss anemia  -Hemoglobin on admission was 13.3, down to 9.6 today (hemoglobin was 10.6 on 01/05/2018, suspect there could also be a delusional component, IV fluids discontinued) -Gastroenterology consulted and appreciated, status post EGD on 01/05/2018 which showed actively oozing gastric polyp, status post endoscopic hemostatic clips -CTA abdomen cannot be done as patient is not to be given contrast per nephrology -GI bleed suspected to be secondary to steroid use and a chronically anticoagulated patient  (last dose of apixaban on 01/01/2018) -Continue to monitor CBC -Discussed with GI that patient is not able to receive contrast, ultrasound liver Doppler has been ordered  Lower back pain -Orthopedic surgery, Dr. Louanne Skye, consulted and appreciated-planning for outpatient epidural injection -Continue pain control  Paroxsymal  atrial fibrillation -CHADSVASC 3 (Age, HTN)  Acute kidney injury on chronic kidney disease, stage III -Creatinine on admission was 2.13, baseline appears to be 1.6 -creatinine currently 1.38 -Continue to monitor BMP  DVT Prophylaxis  SCDS  Code Status: full  Family Communication: Wife at bedside  Disposition Plan: Admitted. Pending further recommendations and workup per GI  Consultants Gastroenterology Interventional radiology Orthopedic surgery  Procedures  EGD  Antibiotics   Anti-infectives (From admission, onward)   None      Subjective:   Timothy [name] seen and examined today.  Patient has no complaints today.  Denies current chest pain, shortness of breath, abdominal pain, nausea vomiting, diarrhea constipation, dizziness or headache.  Has not noticed any further dark emesis or dark or bright blood per rectum. Objective:   Vitals:   01/05/18 1400 01/05/18 2201 01/06/18 0526 01/06/18 1318  BP: (!) 159/68 140/62 (!) 130/54 (!) 145/71  Pulse: (!) 58 65 (!) 50 (!) 51  Resp: 16 19 16 18   Temp: 97.7 F (36.5 C) 98.2 F (36.8 C) 98.1 F (36.7 C) 99 F (37.2 C)  TempSrc: Axillary Oral Oral   SpO2: 98% 97% 97% 98%  Weight:      Height:        Intake/Output Summary (  Last 24 hours) at 01/06/2018 1358 Last data filed at 01/06/2018 0900 Gross per 24 hour  Intake 240 ml  Output -  Net 240 ml   Filed Weights   01/03/18 1842  Weight: 69.6 kg    Exam  General: Well developed, well nourished, NAD, appears stated age  46: NCAT, mucous membranes moist.   Neck: Supple  Cardiovascular: S1 S2 auscultated, RRR, no murmur  Respiratory:  Clear to auscultation bilaterally   Abdomen: Soft, nontender, nondistended, + bowel sounds  Extremities: warm dry without cyanosis clubbing or edema  Neuro: AAOx3, nonfocal  Psych: Normal affect and demeanor   Data Reviewed: I have personally reviewed following labs and imaging studies  CBC: Recent Labs  Lab 01/02/18 1401 01/02/18 1701 01/03/18 0843 01/05/18 0410 01/05/18 1428 01/06/18 0345  WBC 12.2* 11.2*  --  5.0 8.5 5.2  HGB 13.4 13.3 12.3* 10.2* 10.6* 9.6*  HCT 40.2 40.3 38.1* 31.1* 34.1* 30.4*  MCV 100.5* 101.5*  --  100.0 102.4* 101.0*  PLT 153 166  --  112* 128* 347*   Basic Metabolic Panel: Recent Labs  Lab 01/02/18 1401 01/03/18 0442 01/05/18 0410 01/06/18 0345  NA 134* 136 132* 138  K 4.2 4.6 4.6 4.5  CL 98 101 103 110  CO2 26 28 23 23   GLUCOSE 126* 101* 133* 89  BUN 75* 54* 23 20  CREATININE 2.13* 1.85* 1.46* 1.38*  CALCIUM 9.5 8.6* 8.3* 8.3*   GFR: Estimated Creatinine Clearance: 45.5 mL/min (A) (by C-G formula based on SCr of 1.38 mg/dL (H)). Liver Function Tests: Recent Labs  Lab 01/02/18 1401 01/05/18 0410 01/06/18 0345  AST 14* 17 16  ALT 15 13 15   ALKPHOS 67 57 50  BILITOT 0.9 0.6 0.7  PROT 7.4 5.8* 5.4*  ALBUMIN 3.8 2.6* 2.5*   No results for input(s): LIPASE, AMYLASE in the last 168 hours. No results for input(s): AMMONIA in the last 168 hours. Coagulation Profile: Recent Labs  Lab 01/02/18 1401  INR 1.21   Cardiac Enzymes: No results for input(s): CKTOTAL, CKMB, CKMBINDEX, TROPONINI in the last 168 hours. BNP (last 3 results) No results for input(s): PROBNP in the last 8760 hours. HbA1C: No results for input(s): HGBA1C in the last 72 hours. CBG: No results for input(s): GLUCAP in the last 168 hours. Lipid Profile: No results for input(s): CHOL, HDL, LDLCALC, TRIG, CHOLHDL, LDLDIRECT in the last 72 hours. Thyroid Function Tests: No results for input(s): TSH, T4TOTAL, FREET4, T3FREE, THYROIDAB in the last 72  hours. Anemia Panel: No results for input(s): VITAMINB12, FOLATE, FERRITIN, TIBC, IRON, RETICCTPCT in the last 72 hours. Urine analysis: No results found for: COLORURINE, APPEARANCEUR, LABSPEC, PHURINE, GLUCOSEU, HGBUR, BILIRUBINUR, KETONESUR, PROTEINUR, UROBILINOGEN, NITRITE, LEUKOCYTESUR Sepsis Labs: @LABRCNTIP (procalcitonin:4,lacticidven:4)  )No results found for this or any previous visit (from the past 240 hour(s)).    Radiology Studies: No results found.   Scheduled Meds: . acetaminophen  650 mg Oral Q6H  . gabapentin  300 mg Oral TID  . pantoprazole  40 mg Oral Q0600   Continuous Infusions:   LOS: 2 days   Time Spent in minutes   45 minutes (greater than 50% of time spent with patient face to face, as well as reviewing records, calling consults, and formulating a plan)   Cristal Ford D.O. on 01/06/2018 at 1:58 PM  Between 7am to 7pm - Please see pager noted on [email or web address]  After 7pm go to [email or web address]  And look for the night coverage person covering for me  after hours  [employer]  [phone]

## 2018-01-07 ENCOUNTER — Other Ambulatory Visit (INDEPENDENT_AMBULATORY_CARE_PROVIDER_SITE_OTHER): Payer: Self-pay | Admitting: Specialist

## 2018-01-07 ENCOUNTER — Encounter (INDEPENDENT_AMBULATORY_CARE_PROVIDER_SITE_OTHER): Payer: Self-pay | Admitting: Family Medicine

## 2018-01-07 DIAGNOSIS — M5116 Intervertebral disc disorders with radiculopathy, lumbar region: Secondary | ICD-10-CM

## 2018-01-07 DIAGNOSIS — M546 Pain in thoracic spine: Secondary | ICD-10-CM

## 2018-01-07 LAB — BASIC METABOLIC PANEL
ANION GAP: 5 (ref 5–15)
BUN: 19 mg/dL (ref 8–23)
CALCIUM: 8.6 mg/dL — AB (ref 8.9–10.3)
CO2: 28 mmol/L (ref 22–32)
Chloride: 106 mmol/L (ref 98–111)
Creatinine, Ser: 1.58 mg/dL — ABNORMAL HIGH (ref 0.61–1.24)
GFR calc Af Amer: 48 mL/min — ABNORMAL LOW (ref >60)
GFR calc non Af Amer: 41 mL/min — ABNORMAL LOW (ref >60)
GLUCOSE: 89 mg/dL (ref 70–99)
POTASSIUM: 4.5 mmol/L (ref 3.5–5.1)
Sodium: 139 mmol/L (ref 135–145)

## 2018-01-07 LAB — CBC
HEMATOCRIT: 32.2 % — AB (ref 39.0–52.0)
Hemoglobin: 9.9 g/dL — ABNORMAL LOW (ref 13.0–17.0)
MCH: 31.5 pg (ref 26.0–34.0)
MCHC: 30.7 g/dL (ref 30.0–36.0)
MCV: 102.5 fL — AB (ref 80.0–100.0)
NRBC: 0 % (ref 0.0–0.2)
Platelets: 135 10*3/uL — ABNORMAL LOW (ref 150–400)
RBC: 3.14 MIL/uL — ABNORMAL LOW (ref 4.22–5.81)
RDW: 15.4 % (ref 11.5–15.5)
WBC: 5.6 10*3/uL (ref 4.0–10.5)

## 2018-01-07 MED ORDER — CYCLOBENZAPRINE HCL 5 MG PO TABS: 5.0000 mg | ORAL_TABLET | Freq: Three times a day (TID) | ORAL | 0 refills | Status: DC | PRN

## 2018-01-07 MED ORDER — GABAPENTIN 300 MG PO CAPS: 300.0000 mg | ORAL_CAPSULE | Freq: Three times a day (TID) | ORAL | 0 refills | Status: DC

## 2018-01-07 MED FILL — CYCLOBENZAPRINE 10 MG TABLE: 10 | 5 days supply | Qty: 15 | Fill #0

## 2018-01-07 MED FILL — GABAPENTIN 300 MG CAPSULE: 300 | 30 days supply | Qty: 90 | Fill #0

## 2018-01-07 NOTE — Progress Notes (Addendum)
Daily Rounding Note  01/07/2018, 8:21 AM  LOS: 3 days   SUBJECTIVE:   Chief complaint: CGE.  Bleeding gastric polyp.       No BM's since Saturday.  Got solid food for dinner.  NPO this AM for Epidural injection.   No nausea.    OBJECTIVE:         Vital signs in last 24 hours:    Temp:  [98.2 F (36.8 C)-99 F (37.2 C)] 98.2 F (36.8 C) (11/14 0504) Pulse Rate:  [50-53] 53 (11/14 0504) Resp:  [16-20] 16 (11/14 0504) BP: (129-145)/(58-71) 129/63 (11/14 0504) SpO2:  [95 %-99 %] 99 % (11/14 0504) Last BM Date: 01/04/18 Filed Weights   01/03/18 1842  Weight: 69.6 kg   General: pleasant,alert.  Looks well, comfortable   Heart: RRR Chest: clear bil.   Abdomen: soft, NT, ND.  Active BS  Extremities: no CCE Neuro/Psych:  Oriented x 3.  Calm.  No gross deficits.    Intake/Output from previous day: 11/13 0701 - 11/14 0700 In: 240 [P.O.:240] Out: -   Intake/Output this shift: No intake/output data recorded.  Lab Results: Recent Labs    01/05/18 1428 01/06/18 0345 01/06/18 1411 01/07/18 0657  WBC 8.5 5.2  --  5.6  HGB 10.6* 9.6* 10.9* 9.9*  HCT 34.1* 30.4* 33.4* 32.2*  PLT 128* 122*  --  135*   BMET Recent Labs    01/05/18 0410 01/06/18 0345  NA 132* 138  K 4.6 4.5  CL 103 110  CO2 23 23  GLUCOSE 133* 89  BUN 23 20  CREATININE 1.46* 1.38*  CALCIUM 8.3* 8.3*   LFT Recent Labs    01/05/18 0410 01/06/18 0345  PROT 5.8* 5.4*  ALBUMIN 2.6* 2.5*  AST 17 16  ALT 13 15  ALKPHOS 57 50  BILITOT 0.6 0.7   PT/INR No results for input(s): LABPROT, INR in the last 72 hours. Hepatitis Panel No results for input(s): HEPBSAG, HCVAB, HEPAIGM, HEPBIGM in the last 72 hours.  Studies/Results: US Liver Doppler  Result Date: 01/06/2018 CLINICAL DATA:  77 year old male with a bleeding gastric polyp. Evaluate for evidence of portal hypertension. EXAM: DUPLEX ULTRASOUND OF LIVER TECHNIQUE: Color and duplex  Doppler ultrasound was performed to evaluate the hepatic in-flow and out-flow vessels. COMPARISON:  None. FINDINGS: Liver: Mildly echogenic hepatic parenchyma with coarsening of the echotexture. No discrete hepatic lesion is identified. Normal hepatic contour without nodularity. No focal lesion, mass or intrahepatic biliary ductal dilatation. Portal Vein Velocities Main:  38-65 cm/sec Right:  31 cm/sec Left:  22 cm/sec Hepatic Vein Velocities Right:  24 cm/sec Middle:  25 cm/sec Left:  17 cm/sec IVC: Present and patent with normal respiratory phasicity. Hepatic Artery Velocity:  178 cm/sec Splenic Vein Velocity:  46 cm/sec Varices: None visualized Ascites: None Spleen: Normal splenic size.  No discrete lesion. Other: Incidentally imaged renal parenchyma appears echogenic suggesting underlying medical renal disease. IMPRESSION: 1. Hepatic steatosis. 2. Widely patent portal veins without ultrasound evidence of portal hypertension. 3. Echogenic renal parenchyma suggests underlying medical renal disease. Electronically Signed   By: Jacqulynn Cadet M.D.   On: 01/06/2018 16:14   Scheduled Meds: . acetaminophen  650 mg Oral Q6H  . gabapentin  300 mg Oral TID  . pantoprazole  40 mg Oral Q0600   Continuous Infusions: PRN Meds:.cyclobenzaprine, morphine injection, ondansetron (ZOFRAN) IV, oxyCODONE-acetaminophen   ASSESMENT:   *    CGE.  Recent course of  prednisone and NSAIDs for back pain.   01/05/18 EGD: gastric polyps, one actively oozing blood treated with endoclipping Protonix 40 mg IV >> PO BID.   *   Blood loss anemia.    *   Fatty liver, patent PV, no portal htn per ultrasound.  LFTs normal.    *   Afib, chronic Eliquis on hold.    *   Lumbar spine disease with radiculopathy. S/p ESI (steroid injection) this AM.    *   Thrombocytopenia. Non-critical.  Improved.    *   AKI.  Improved.  medical renal dz per ultrasound.    *   Radiation proctitis 2013, hx radiation for proatate cancer  in 2010.      [place] for discharge today.  Restart Eliquis 11/16.    *  Has ROV set for 12/10 with Dr Lyndel Safe.  Will then be set up for repeat EGD and screening colonoscopy (last done 2006).      [name]  01/07/2018, 8:21 AM Phone [phone]   Attending physician's note   I have taken reviewed the chart. I agree with the Advanced Practitioner's note, impression and recommendations.  No further coffee-ground emesis/melena.  Upper GI bleeding status post EGD 01/05/2018 showing gastric polyps, one with active oozing status post Endo clipping. CTA not approved by nephrology, croissant with Dopplers negative for portal hypertension. Plan: Okay to discharge, resume Eliquis 11/16.  Patient does understand that there is higher risk of bleeding.  Avoid nonsteroidals and prednisone if possible.  Would need repeat EGD and screening colonoscopy as outpatient.  Carmell Austria, MD

## 2018-01-07 NOTE — Progress Notes (Signed)
Seen in the hospital admitted to [facility] 11/9 with intractable nausea and vomitting. He was having coffee ground emesis. Was seen at [facility] and admitted with UGI bleed. Eloquis chronically for afib was discontinued. He was not able to tolerate MRI of the lumbar spine due to pain and sciatica.Was transferred to [facility] and underwent general anesthesia for an MRI Of the lumbar spine. 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 Post anesthesia his left leg pain improved and he was more tolerant of lying flat and PT. He had an UGI endoscopy and this was completed and found to have a friable gastric polyp. ESI was not able to done at [facility]. He was discharge home on 01/07/2018 and requested an ESI be scheduled on an out patient basis. This is done today.

## 2018-01-07 NOTE — Progress Notes (Signed)
Patient ID: Timothy [name], male   DOB: [birth year], 77 y.o.   MRN: [id number]   Lumbar 3-4 ESI requested  Unfortunately NO Radiologist is scheduled at [facility] today that performs this procedure.  Discussed  with pt ; wife and Dr Louanne Skye  Will be performed as OP Dr Louanne Skye will order as OP

## 2018-01-07 NOTE — Progress Notes (Signed)
Patient ID: Timothy [name], male   DOB: 04-Nov-1940, 77 y.o.   MRN: [id number] Seen this morning in good spirits, ready to go home following ESI left L3-4 transforaminal. Left leg as before with quad weakness 5-/5, foot DF is 5/5. 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 Previous HNP 2014 resolved on MRI Lumbar spondylololisthesis. NPO ESI today Should be okay for discharge 1-2 hours following ESI. Resume diet post ESI.

## 2018-01-07 NOTE — Progress Notes (Signed)
Nsg Discharge Note  Admit Date:  01/02/2018 Discharge date: 01/07/2018   Timothy [name] to be D/C'd Home per MD order.  AVS completed.  Copy for chart, and copy for patient signed, and dated. Patient/caregiver able to verbalize understanding.  Discharge Medication: Allergies as of 01/07/2018      Reactions   Contrast Media [iodinated Diagnostic Agents]       Medication List    STOP taking these medications   apixaban 5 MG Tabs tablet Commonly known as:  ELIQUIS   baclofen 10 MG tablet Commonly known as:  LIORESAL   furosemide 40 MG tablet Commonly known as:  LASIX   telmisartan 80 MG tablet Commonly known as:  MICARDIS     TAKE these medications   allopurinol 300 MG tablet Commonly known as:  ZYLOPRIM Take 300 mg by mouth daily.   amLODipine 10 MG tablet Commonly known as:  NORVASC Take 10 mg by mouth daily.   cyclobenzaprine 5 MG tablet Commonly known as:  FLEXERIL Take 1 tablet (5 mg total) by mouth 3 (three) times daily as needed for muscle spasms.   ezetimibe 10 MG tablet Commonly known as:  ZETIA Take 10 mg by mouth daily.   gabapentin 300 MG capsule Commonly known as:  NEURONTIN Take 1 capsule (300 mg total) by mouth 3 (three) times daily.   omeprazole 20 MG capsule Commonly known as:  PRILOSEC Take 20 mg by mouth daily.   oxyCODONE-acetaminophen 7.5-325 MG tablet Commonly known as:  PERCOCET Take 1 tablet by mouth every 4 (four) hours as needed for severe pain.            Durable Medical Equipment  (From admission, onward)         Start     Ordered   01/07/18 0951  For home use only DME Walker rolling  Once    Question:  Patient needs a walker to treat with the following condition  Answer:  Weakness   01/07/18 0951          Discharge Assessment: Vitals:   01/06/18 2125 01/07/18 0504  BP: (!) 137/58 129/63  Pulse: (!) 50 (!) 53  Resp: 20 16  Temp: 98.4 F (36.9 C) 98.2 F (36.8 C)  SpO2: 95% 99%   Skin clean, dry and intact  without evidence of skin break down, no evidence of skin tears noted. IV catheter discontinued intact. Site without signs and symptoms of complications - no redness or edema noted at insertion site, patient denies c/o pain - only slight tenderness at site.  Dressing with slight pressure applied.  D/c Instructions-Education: Discharge instructions given to patient/family with verbalized understanding. D/c education completed with patient/family including follow up instructions, medication list, d/c activities limitations if indicated, with other d/c instructions as indicated by MD - patient able to verbalize understanding, all questions fully answered. Patient instructed to return to ED, call 911, or call MD for any changes in condition.  Patient escorted via [place], and D/C home via private auto.  [place] N Usbaldo Pannone, RN 01/07/2018 12:11 PM

## 2018-01-07 NOTE — Care Management Note (Signed)
Case Management Note  Patient Details  Name: HILLEL CARD MRN: [id number] Date of Birth: [birth year]  Subjective/Objective:        Acute Upper GI bleeding/ Acute blood loss anemia  Lower back pain.           Efraim Vanallen (Spouse)     [phone]      PCP: Crist Infante  Action/Plan: Transition to home with outpatient PT follow up.  Expected Discharge Date:  01/07/18               Expected Discharge Plan:  Home/Self Care  In-House Referral:     Discharge planning Services  CM Consult  Post Acute Care Choice:  NA Choice offered to:  Patient  DME Arranged:  Gilford Rile rolling(rolling walker will be delivered to bedside prior to discharge) DME Agency:  [place]:  NA [place] Agency:  NA  Status of Service:  Completed, signed off  If discussed at [place] of Stay Meetings, dates discussed:    Additional Comments:  Sharin Mons, RN 01/07/2018, 11:16 AM

## 2018-01-07 NOTE — Progress Notes (Signed)
Physical Therapy Treatment Patient Details Name: Timothy [name] MRN: [id number] DOB: 1940-04-13 Today's Date: 01/07/2018    History of Present Illness Pt is a 77 year old male who presented with coffee-ground emesis and back pain.  He does have significant past medical history for GERD, hypertension, dyslipidemia and paroxysmal atrial fibrillation.    PT Comments    Pt remains limited with activity tolerance secondary to increasing pain in L hip and thigh. Pt reported that he is expecting to have a "spinal injection" today and did not want to do too much to create more pain. Pt demonstrated and instructed pt in safe car transfer technique. Pt would continue to benefit from skilled physical therapy services at this time while admitted and after d/c to address the below listed limitations in order to improve overall safety and independence with functional mobility.   Follow Up Recommendations  Outpatient PT     Equipment Recommendations  Rolling walker with 5" wheels    Recommendations for Other Services       Precautions / Restrictions Precautions Precautions: Back Precaution Booklet Issued: No Restrictions Weight Bearing Restrictions: No    Mobility  Bed Mobility Overal bed mobility: Modified Independent             General bed mobility comments: good log roll technique  Transfers Overall transfer level: Modified independent                  Ambulation/Gait Ambulation/Gait assistance: Supervision Gait Distance (Feet): 100 Feet Assistive device: Rolling walker (2 wheeled) Gait Pattern/deviations: Decreased stride length Gait velocity: decreased   General Gait Details: pt with good posture with use of RW, steady with no LOB or need for physical assistance, supervision for safety   Stairs             Wheelchair Mobility    Modified Rankin (Stroke Patients Only)       Balance Overall balance assessment: Mild deficits observed, not formally  tested                                          Cognition Arousal/Alertness: Awake/alert Behavior During Therapy: WFL for tasks assessed/performed Overall Cognitive Status: Within Functional Limits for tasks assessed                                        Exercises      General Comments        Pertinent Vitals/Pain Pain Assessment: 0-10 Pain Score: 4  Pain Location: L hip and low back Pain Descriptors / Indicators: Sore Pain Intervention(s): Monitored during session;Repositioned    Home Living                      Prior Function            PT Goals (current goals can now be found in the care plan section) Acute Rehab PT Goals PT Goal Formulation: With patient Time For Goal Achievement: 01/19/18 Potential to Achieve Goals: Good Progress towards PT goals: Progressing toward goals    Frequency    Min 3X/week      PT Plan Current plan remains appropriate    Co-evaluation              AM-PAC PT "6 Clicks" Daily Activity  Outcome Measure  Difficulty turning over in bed (including adjusting bedclothes, sheets and blankets)?: None Difficulty moving from lying on back to sitting on the side of the bed? : None Difficulty sitting down on and standing up from a chair with arms (e.g., wheelchair, bedside commode, etc,.)?: A Little Help needed moving to and from a bed to chair (including a wheelchair)?: None Help needed walking in hospital room?: None Help needed climbing 3-5 steps with a railing? : A Little 6 Click Score: 22    End of Session   Activity Tolerance: Patient limited by pain Patient left: in bed;with call bell/phone within reach Nurse Communication: Mobility status PT Visit Diagnosis: Other abnormalities of gait and mobility (R26.89);Pain Pain - Right/Left: Left Pain - part of body: Hip     Time: 0910-0919 PT Time Calculation (min) (ACUTE ONLY): 9 min  Charges:  $Therapeutic Activity: 8-22  mins                     Sherie Don, PT, DPT  Acute Rehabilitation Services Pager [phone] Office [place] 01/07/2018, 10:05 AM

## 2018-01-07 NOTE — Consult Note (Signed)
            [facility] CM Primary Care Navigator  01/07/2018  Timothy [name] 1940/08/07 [id number]   Went to seepatient in the room to identify possible discharge needs buthe wasalready discharged home(with outpatient PT) per staff report. Per Inpatient CM, patient is a pharmacist.  Per MD note, patient presented with back pain as well as episodes of coffee-ground emesis throughout the day that led to admission.  (acute Upper GI bleeding, acute blood loss anemia, lower back pain, paroxsymalatrial fibrillation, acute kidney injury on chronic kidney disease stage III)    Patient has discharge instruction to follow-up withprimary care provider in 1 week, nephrology in 1 week and orthopedic surgery in 1 week.  Primary care provider's office is listed as providing transition of care (TOC) follow-up   For additional questions please contact:  Edwena Felty A. Corie Allis, BSN, RN-BC [facility] PRIMARY CARE Navigator Cell: [phone]

## 2018-01-07 NOTE — Discharge Summary (Signed)
Physician Discharge Summary  ASCENSION STFLEUR WUJ:[id number] DOB: 1940-05-08 DOA: 01/02/2018  PCP: Crist Infante, MD  Admit date: 01/02/2018 Discharge date: 01/07/2018  Time spent: 45 minutes  Recommendations for Outpatient Follow-up:  Patient will be discharged to home with outpatient physical thearpy.  Patient will need to follow up with primary care provider within one week of discharge, repeat CBC.  Patient should continue medications as prescribed. Continue to hold Eliquis.  Follow up with Dr. Louanne Skye for epidural injection. Follow up with gastroenterology. Patient should follow a heart healthy diet.   Discharge Diagnoses:  Acute Upper GI bleeding/ Acute blood loss anemia  Lower back pain Paroxsymal  atrial fibrillation Acute kidney injury on chronic kidney disease, stage III  Discharge Condition: Stable   Diet recommendation: heart healthy  Filed Weights   01/03/18 1842  Weight: 69.6 kg    History of present illness:  On 01/02/2018 by Dr. Halina Andreas a 77 y.o.malewith medical history significant ofGERD hypertension, hyperlipidemia, paroxysmal A. fib on Eliquis presenting to the hospital with back pain as well as episodes of coffee-ground emesis throughout the day.Patient was somnolent secondary to receiving a dose of benzodiazepine for MRI. History provided by wife at bedside. Wife states patient has been complaining of severe lumbar back pain for the past 2 weeks. He was seen at orthopedic office on October 31 and plan was to do an MRI today. Patient takes Eliquis for A. fib and was recently treated with 2 rounds of prednisone taper for his back pain. States today he woke up nauseous and has vomited 4 times throughout the day. Wife describes it as large volume "dark, black, grainy"vomit. States he was unable to get his MRI done outpatient today due to vomiting. States he has not been eating much for the last few days secondary to his back pain. Patient  denies having any abdominal pain. Denies having any weakness in his legs. Denies having any bowel/bladder incontinence or saddle anesthesia. Denies having any cough or shortness of breath. Denies having any dysuria, urinary frequency, or urgency.  Hospital Course:  Acute Upper GI bleeding/ Acute blood loss anemia  -Hemoglobin on admission was 13.3, down to 9.6 today (hemoglobin was 10.6 on 01/05/2018, suspect there could also be a delusional component, IV fluids discontinued) -Gastroenterology consulted and appreciated, status post EGD on 01/05/2018 which showed actively oozing gastric polyp, status post endoscopic hemostatic clips -CTA abdomen cannot be done as patient is not to be given contrast per nephrology -GI bleed suspected to be secondary to steroid use and a chronically anticoagulated patient (last dose of apixaban on 01/01/2018) -Continue to monitor CBC -Discussed with GI that patient is not able to receive contrast, ultrasound liver Doppler ordered: Hepatic steatosis widely patent portal veins without ultrasound evidence of portal hypertension.  Echogenic renal parenchyma suggest underlying medical renal disease.  Lower back pain -Orthopedic surgery, Dr. Louanne Skye, consulted and appreciated-planning for outpatient epidural injection -Continue pain control  Paroxsymal  atrial fibrillation -CHADSVASC 3 (Age, HTN)  Acute kidney injury on chronic kidney disease, stage III -Creatinine on admission was 2.13, baseline appears to be 1.6 -creatinine currently 1.58  Essential hypertension -continue amlodipine  -lasix and micardis held and should be discussed with nephrology  Consultants Gastroenterology Interventional radiology Orthopedic surgery  Procedures  EGD US liver/doppler  Discharge Exam: Vitals:   01/06/18 2125 01/07/18 0504  BP: (!) 137/58 129/63  Pulse: (!) 50 (!) 53  Resp: 20 16  Temp: 98.4 F (36.9 C) 98.2 F (  36.8 C)  SpO2: 95% 99%   Patient with no  complaints today. Denies current chest pain, shortness of breath, abdominal pain, N/V/D/C.    General: Well developed, well nourished, NAD, appears stated age  93: NCAT, mucous membranes moist.  Cardiovascular: S1 S2 auscultated, RRR, no murmur  Respiratory: Clear to auscultation bilaterally with equal chest rise  Abdomen: Soft, nontender, nondistended, + bowel sounds  Extremities: warm dry without cyanosis clubbing or edema  Neuro: AAOx3, nonfocal  Psych: Normal affect and demeanor with intact judgement and insight  Discharge Instructions Discharge Instructions    Discharge instructions   Complete by:  As directed    Patient will be discharged to home with outpatient physical thearpy.  Patient will need to follow up with primary care provider within one week of discharge, repeat CBC.  Patient should continue medications as prescribed. Continue to hold Eliquis.  Follow up with Dr. Louanne Skye for epidural injection. Follow up with gastroenterology. Patient should follow a heart healthy diet.   Discuss blood pressure medications with Dr. Jimmy Footman, nephrology.     Allergies as of 01/07/2018      Reactions   Contrast Media [iodinated Diagnostic Agents]       Medication List    STOP taking these medications   apixaban 5 MG Tabs tablet Commonly known as:  ELIQUIS   baclofen 10 MG tablet Commonly known as:  LIORESAL   furosemide 40 MG tablet Commonly known as:  LASIX   telmisartan 80 MG tablet Commonly known as:  MICARDIS     TAKE these medications   allopurinol 300 MG tablet Commonly known as:  ZYLOPRIM Take 300 mg by mouth daily.   amLODipine 10 MG tablet Commonly known as:  NORVASC Take 10 mg by mouth daily.   cyclobenzaprine 5 MG tablet Commonly known as:  FLEXERIL Take 1 tablet (5 mg total) by mouth 3 (three) times daily as needed for muscle spasms.   ezetimibe 10 MG tablet Commonly known as:  ZETIA Take 10 mg by mouth daily.   gabapentin 300 MG  capsule Commonly known as:  NEURONTIN Take 1 capsule (300 mg total) by mouth 3 (three) times daily.   omeprazole 20 MG capsule Commonly known as:  PRILOSEC Take 20 mg by mouth daily.   oxyCODONE-acetaminophen 7.5-325 MG tablet Commonly known as:  PERCOCET Take 1 tablet by mouth every 4 (four) hours as needed for severe pain.            Durable Medical Equipment  (From admission, onward)         Start     Ordered   01/07/18 0951  For home use only DME Walker rolling  Once    Question:  Patient needs a walker to treat with the following condition  Answer:  Weakness   01/07/18 0951         Allergies  Allergen Reactions  . Contrast Media [Iodinated Diagnostic Agents]    Follow-up Information    Jessy Oto, MD Follow up in 1 week(s).   Specialty:  Orthopedic Surgery Contact information: [place] [address] [phone]        Medicine, [facility] Follow up.   Contact information: [place] [address] [phone]        Crist Infante, MD. Schedule an appointment as soon as possible for a visit in 1 week(s).   Specialty:  Internal Medicine Why:  Hospital follow up Contact information: [place]  [place] [address] [phone]        Nahser, Wonda Cheng, MD .   Specialty:  Cardiology Contact information: [place] [address] [phone]        Mauricia Area, MD. Schedule an appointment as soon as possible for a visit in 1 week(s).   Specialty:  Nephrology Why:  hospital follow up Contact information: [place] [address] [phone]            The results of significant diagnostics from this hospitalization (including imaging, microbiology, ancillary and laboratory) are listed below for reference.    Significant Diagnostic Studies: Mr Lumbar Spine Wo Contrast  Result Date: 01/04/2018 CLINICAL DATA:   Low back pain.  No known injury EXAM: MRI LUMBAR SPINE WITHOUT CONTRAST TECHNIQUE: Multiplanar, multisequence MR imaging of the lumbar spine was performed. No intravenous contrast was administered. COMPARISON:  None. FINDINGS: Segmentation:  Standard. Alignment: Minimal retrolisthesis of L3 on L4. Grade 1 anterolisthesis of L4 on L5 secondary to facet disease. Vertebrae:  No fracture, evidence of discitis, or bone lesion. Conus medullaris and cauda equina: Conus extends to the L1 level. Conus and cauda equina appear normal. Paraspinal and other soft tissues: No acute paraspinal abnormality. Small left renal cyst. Disc levels: Disc spaces: Degenerative disc disease with disc height loss at L5-S1. Disc desiccation at L1-2, L3-4 and L4-5. T12-L1: No significant disc bulge. No evidence of neural foraminal stenosis. No central canal stenosis. L1-L2: Mild broad-based disc bulge. Mild bilateral facet arthropathy. Mild spinal stenosis. No evidence of neural foraminal stenosis. L2-L3: Broad-based disc bulge. Moderate bilateral facet arthropathy with bilateral facet effusions. Mild spinal stenosis and bilateral lateral recess stenosis. Mild bilateral foraminal stenosis. L3-L4: Broad-based disc bulge with a central broad disc extrusion and left paracentral cranial migration of disc material along the L3 vertebral body with mass effect on the left intraspinal L4 nerve root and possibly contacting the left L3 nerve root. Moderate left foraminal stenosis. No right foraminal stenosis. Moderate bilateral facet arthropathy with facet effusions. Moderate-severe spinal stenosis. L4-L5: Broad-based disc bulge. Severe bilateral facet arthropathy. Moderate spinal stenosis and bilateral lateral recess stenosis. Mild bilateral foraminal stenosis. L5-S1: Broad-based disc bulge. Moderate bilateral facet arthropathy. Mild bilateral foraminal stenosis. No central canal stenosis. IMPRESSION: 1. At L3-4 there is a broad-based disc bulge with a  central broad disc extrusion diffuse lumbar spine spondylosis as described above. And left paracentral cranial migration of disc material along the L3 vertebral body with mass effect on the left intraspinal L4 nerve root and possibly contacting the left L3 nerve root. Moderate left foraminal stenosis. No right foraminal stenosis. Moderate bilateral facet arthropathy with facet effusions. Moderate-severe spinal stenosis. 2. Electronically Signed   By: Kathreen Devoid   On: 01/04/2018 11:46   Mr Lumbar Spine Wo Contrast  Result Date: 01/02/2018 CLINICAL DATA:  Back pain radiating to left leg. EXAM: MRI LUMBAR SPINE WITHOUT CONTRAST TECHNIQUE: Multiplanar, multisequence MR imaging of the lumbar spine was performed. No intravenous contrast was administered. COMPARISON:  Lumbar spine radiograph 12/24/2017 FINDINGS: The examination was discontinued before completion. There is grade 1 anterolisthesis at L4-L5. The examination otherwise shows normal alignment without evidence of compression fracture. IMPRESSION: Grade 1 anterolisthesis at L4-L5. Otherwise, discontinued examination with limited diagnostic information. Electronically Signed   By: Ulyses Jarred M.D.   On: 01/02/2018 18:18   US Liver Doppler  Result Date: 01/06/2018 CLINICAL DATA:  77 year old male with a bleeding gastric polyp. Evaluate for evidence  of portal hypertension. EXAM: DUPLEX ULTRASOUND OF LIVER TECHNIQUE: Color and duplex Doppler ultrasound was performed to evaluate the hepatic in-flow and out-flow vessels. COMPARISON:  None. FINDINGS: Liver: Mildly echogenic hepatic parenchyma with coarsening of the echotexture. No discrete hepatic lesion is identified. Normal hepatic contour without nodularity. No focal lesion, mass or intrahepatic biliary ductal dilatation. Portal Vein Velocities Main:  38-65 cm/sec Right:  31 cm/sec Left:  22 cm/sec Hepatic Vein Velocities Right:  24 cm/sec Middle:  25 cm/sec Left:  17 cm/sec IVC: Present and patent with  normal respiratory phasicity. Hepatic Artery Velocity:  178 cm/sec Splenic Vein Velocity:  46 cm/sec Varices: None visualized Ascites: None Spleen: Normal splenic size.  No discrete lesion. Other: Incidentally imaged renal parenchyma appears echogenic suggesting underlying medical renal disease. IMPRESSION: 1. Hepatic steatosis. 2. Widely patent portal veins without ultrasound evidence of portal hypertension. 3. Echogenic renal parenchyma suggests underlying medical renal disease. Electronically Signed   By: Jacqulynn Cadet M.D.   On: 01/06/2018 16:14   Xr Lumbar Spine 2-3 Views  Result Date: 12/24/2017 X-rays of the lumbar spine reveal abnormal straightening.  He does appear to have a retrolisthesis at L5 with significant degenerative disc disease L5-S1.   Microbiology: No results found for this or any previous visit (from the past 240 hour(s)).   Labs: Basic Metabolic Panel: Recent Labs  Lab 01/02/18 1401 01/03/18 0442 01/05/18 0410 01/06/18 0345 01/07/18 0657  NA 134* 136 132* 138 139  K 4.2 4.6 4.6 4.5 4.5  CL 98 101 103 110 106  CO2 26 28 23 23 28   GLUCOSE 126* 101* 133* 89 89  BUN 75* 54* 23 20 19   CREATININE 2.13* 1.85* 1.46* 1.38* 1.58*  CALCIUM 9.5 8.6* 8.3* 8.3* 8.6*   Liver Function Tests: Recent Labs  Lab 01/02/18 1401 01/05/18 0410 01/06/18 0345  AST 14* 17 16  ALT 15 13 15   ALKPHOS 67 57 50  BILITOT 0.9 0.6 0.7  PROT 7.4 5.8* 5.4*  ALBUMIN 3.8 2.6* 2.5*   No results for input(s): LIPASE, AMYLASE in the last 168 hours. No results for input(s): AMMONIA in the last 168 hours. CBC: Recent Labs  Lab 01/02/18 1701  01/05/18 0410 01/05/18 1428 01/06/18 0345 01/06/18 1411 01/07/18 0657  WBC 11.2*  --  5.0 8.5 5.2  --  5.6  HGB 13.3   < > 10.2* 10.6* 9.6* 10.9* 9.9*  HCT 40.3   < > 31.1* 34.1* 30.4* 33.4* 32.2*  MCV 101.5*  --  100.0 102.4* 101.0*  --  102.5*  PLT 166  --  112* 128* 122*  --  135*   < > = values in this interval not displayed.    Cardiac Enzymes: No results for input(s): CKTOTAL, CKMB, CKMBINDEX, TROPONINI in the last 168 hours. BNP: BNP (last 3 results) No results for input(s): BNP in the last 8760 hours.  ProBNP (last 3 results) No results for input(s): PROBNP in the last 8760 hours.  CBG: No results for input(s): GLUCAP in the last 168 hours.     Signed:  Cristal Ford  [facility] 01/07/2018, 10:49 AM

## 2018-01-08 ENCOUNTER — Telehealth (INDEPENDENT_AMBULATORY_CARE_PROVIDER_SITE_OTHER): Payer: Self-pay | Admitting: *Deleted

## 2018-01-08 NOTE — Telephone Encounter (Signed)
06/10/17 pt has medicare no pa is needed.

## 2018-01-11 ENCOUNTER — Encounter (INDEPENDENT_AMBULATORY_CARE_PROVIDER_SITE_OTHER): Payer: Self-pay | Admitting: Physical Medicine and Rehabilitation

## 2018-01-11 ENCOUNTER — Ambulatory Visit (INDEPENDENT_AMBULATORY_CARE_PROVIDER_SITE_OTHER): Payer: Self-pay

## 2018-01-11 ENCOUNTER — Ambulatory Visit (INDEPENDENT_AMBULATORY_CARE_PROVIDER_SITE_OTHER): Payer: Medicare Other | Admitting: Physical Medicine and Rehabilitation

## 2018-01-11 VITALS — BP 140/70 | Temp 98.4°F

## 2018-01-11 DIAGNOSIS — M5416 Radiculopathy, lumbar region: Secondary | ICD-10-CM | POA: Diagnosis not present

## 2018-01-11 MED ORDER — BETAMETHASONE SOD PHOS & ACET 6 (3-3) MG/ML IJ SUSP
12.0000 mg | Freq: Once | INTRAMUSCULAR | Status: AC
  Administered 2018-01-11: 12 mg

## 2018-01-11 NOTE — Progress Notes (Signed)
 .  Numeric Pain Rating Scale and Functional Assessment Average Pain 8   In the last MONTH (on 0-10 scale) has pain interfered with the following?  1. General activity like being  able to carry out your everyday physical activities such as walking, climbing stairs, carrying groceries, or moving a chair?  Rating(6)   +Driver, +BT(stopped eliquis 01/01/18 due to hospitalization), +Dye Allergies(Dye Allergies).

## 2018-01-11 NOTE — Procedures (Signed)
Lumbosacral Transforaminal Epidural Steroid Injection - Sub-Pedicular Approach with Fluoroscopic Guidance  Patient: Timothy [name]      Date of Birth: Apr 16, 1940 MRN: [id number] PCP: Crist Infante, MD      Visit Date: 01/11/2018   Universal Protocol:    Date/Time: 01/11/2018  Consent Given By: the patient  Position: PRONE  Additional Comments: Vital signs were monitored before and after the procedure. Patient was prepped and draped in the usual sterile fashion. The correct patient, procedure, and site was verified.   Injection Procedure Details:  Procedure Site One Meds Administered:  Meds ordered this encounter  Medications  . betamethasone acetate-betamethasone sodium phosphate (CELESTONE) injection 12 mg    Laterality: Left  Location/Site:  L3-L4  Needle size: 22 G  Needle type: Spinal  Needle Placement: Transforaminal  Findings:    -Comments: Good flow of contrast in the foramen and nerve root.  Patient had difficulty with the positioning on the table and had significant nerve pain trying to lay prone.  We did try to get him into a more obliqued body position but even this was painful.  Injection did work well placed otherwise.  Procedure Details: After squaring off the end-plates to get a true AP view, the C-arm was positioned so that an oblique view of the foramen as noted above was visualized. The target area is just inferior to the "nose of the scotty dog" or sub pedicular. The soft tissues overlying this structure were infiltrated with 2-3 ml. of 1% Lidocaine without Epinephrine.  The spinal needle was inserted toward the target using a "trajectory" view along the fluoroscope beam.  Under AP and lateral visualization, the needle was advanced so it did not puncture dura and was located close the 6 O'Clock position of the pedical in AP tracterory. Biplanar projections were used to confirm position. Aspiration was confirmed to be negative for CSF and/or blood. A 1-2  ml. volume of Isovue-250 was injected and flow of contrast was noted at each level. Radiographs were obtained for documentation purposes.   After attaining the desired flow of contrast documented above, a 0.5 to 1.0 ml test dose of 0.25% Marcaine was injected into each respective transforaminal space.  The patient was observed for 90 seconds post injection.  After no sensory deficits were reported, and normal lower extremity motor function was noted,   the above injectate was administered so that equal amounts of the injectate were placed at each foramen (level) into the transforaminal epidural space.   Additional Comments:  No complications occurred Dressing: Band-Aid    Post-procedure details: Patient was observed during the procedure. Post-procedure instructions were reviewed.  Patient left the clinic in stable condition.

## 2018-01-11 NOTE — Patient Instructions (Signed)

## 2018-01-14 ENCOUNTER — Encounter (INDEPENDENT_AMBULATORY_CARE_PROVIDER_SITE_OTHER): Payer: Self-pay | Admitting: Family Medicine

## 2018-01-14 ENCOUNTER — Ambulatory Visit (INDEPENDENT_AMBULATORY_CARE_PROVIDER_SITE_OTHER): Payer: Medicare Other | Admitting: Family Medicine

## 2018-01-14 ENCOUNTER — Ambulatory Visit (INDEPENDENT_AMBULATORY_CARE_PROVIDER_SITE_OTHER): Payer: BLUE CROSS/BLUE SHIELD | Admitting: Family Medicine

## 2018-01-14 DIAGNOSIS — Z6823 Body mass index (BMI) 23.0-23.9, adult: Secondary | ICD-10-CM | POA: Diagnosis not present

## 2018-01-14 DIAGNOSIS — N184 Chronic kidney disease, stage 4 (severe): Secondary | ICD-10-CM | POA: Diagnosis not present

## 2018-01-14 DIAGNOSIS — I10 Essential (primary) hypertension: Secondary | ICD-10-CM | POA: Diagnosis not present

## 2018-01-14 DIAGNOSIS — R7301 Impaired fasting glucose: Secondary | ICD-10-CM | POA: Diagnosis not present

## 2018-01-14 DIAGNOSIS — M5117 Intervertebral disc disorders with radiculopathy, lumbosacral region: Secondary | ICD-10-CM | POA: Diagnosis not present

## 2018-01-14 DIAGNOSIS — M5116 Intervertebral disc disorders with radiculopathy, lumbar region: Secondary | ICD-10-CM

## 2018-01-14 DIAGNOSIS — E785 Hyperlipidemia, unspecified: Secondary | ICD-10-CM | POA: Diagnosis not present

## 2018-01-14 DIAGNOSIS — Z7901 Long term (current) use of anticoagulants: Secondary | ICD-10-CM | POA: Diagnosis not present

## 2018-01-14 DIAGNOSIS — K219 Gastro-esophageal reflux disease without esophagitis: Secondary | ICD-10-CM | POA: Diagnosis not present

## 2018-01-14 DIAGNOSIS — I480 Paroxysmal atrial fibrillation: Secondary | ICD-10-CM | POA: Diagnosis not present

## 2018-01-14 DIAGNOSIS — D62 Acute posthemorrhagic anemia: Secondary | ICD-10-CM | POA: Diagnosis not present

## 2018-01-14 NOTE — Progress Notes (Signed)
Office Visit Note   Patient: Timothy [name]           Date of Birth: 1940-07-12           MRN: [id number] Visit Date: 01/14/2018 Requested by: Crist Infante, MD [address] [place], [place] [address] PCP: Crist Infante, MD  Subjective: Chief Complaint  Patient presents with  . Lower Back - Pain, Follow-up    S/p ESI with Dr. Ernestina Patches 01/11/18.    HPI: He is here for follow-up low back and left leg pain due to L3-4 disc protrusion.  He had his epidural injection on Monday and is feeling substantially better.  He has not needed any pain medicine in a couple days.  He still has a little bit of numbness in his anterior thigh but even that seems to be improving.  No incontinence.              ROS: Noncontributory  Objective: Vital Signs: There were no vitals taken for this visit.  Physical Exam:  Low back: Slightly tender in the left gluteus medius area.  No pain in the sciatic notch.  No midline spinous process tenderness.  Lower extremity strength and reflexes were normal today.  Imaging: None today.  Assessment & Plan: 1.  Improved low back and left leg pain due to L3-4 disc protrusion -Start physical therapy.  Wean from Flexeril and gabapentin.  Repeat epidural injection if symptoms worsen again.  Otherwise follow-up PRN.   Follow-Up Instructions: No follow-ups on file.      Procedures: No procedures performed  No notes on file    PMFS History: Patient Active Problem List   Diagnosis Date Noted  . GI bleed 01/04/2018  . Upper GI bleed 01/02/2018  . Coffee ground emesis 01/02/2018  . AKI (acute kidney injury) ([place]) 01/02/2018  . Leukocytosis 01/02/2018  . Gout 01/02/2018  . Lumbar back pain 12/24/2017  . Sinus bradycardia   . Prostate cancer ([place])   . Peripheral vascular disease ([place])   . Paroxysmal atrial fibrillation ([facility])   . Kidney disease   . HLD (hyperlipidemia)   . GERD (gastroesophageal reflux disease)   . Diverticulosis   . Chronic renal failure     . Bright red rectal bleeding   . Anal fissure   . PAF (paroxysmal atrial fibrillation) ([place]) 11/28/2016  . Near syncope 11/28/2016  . Palpitations 10/28/2016  . Transient cerebral ischemia 10/28/2016  . Mixed hyperlipidemia 10/28/2016  . C2 cervical fracture ([place]) 06/24/2014  . HTN (hypertension) 06/05/2011  . H/O prostate cancer 06/05/2011  . 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 Hemorrhage of rectum and anus 05/09/2011   Past Medical History:  Diagnosis Date  . Anal fissure    ?  . Bright red rectal bleeding   . C2 cervical fracture ([place]) 06/24/2014  . Chronic renal failure   . Diverticulosis   . GERD (gastroesophageal reflux disease)   . H/O prostate cancer 06/05/2011   S/p radiation rx 2010   . Hemorrhage of rectum and anus 05/09/2011  . HLD (hyperlipidemia)   . HTN (hypertension)   . Kidney disease    acute glomeral nepritis  . Paroxysmal atrial fibrillation ([facility])   . Peripheral vascular disease ([place])    carotid artery stent ( right  )  . Prostate cancer ([place])   . 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 Sinus bradycardia     Family History  Problem Relation Age of Onset  . Brain cancer Brother   .  Atrial fibrillation Brother   . Colon cancer Neg Hx     Past Surgical History:  Procedure Laterality Date  . CAROTID STENT    . ESOPHAGOGASTRODUODENOSCOPY (EGD) WITH PROPOFOL N/A 01/05/2018   Procedure: ESOPHAGOGASTRODUODENOSCOPY (EGD) WITH PROPOFOL;  Surgeon: [name], MD;  Location: [facility] ENDOSCOPY;  Service: Endoscopy;  Laterality: N/A;  . external beam radiation to prostate  2009  . FLEXIBLE SIGMOIDOSCOPY  05/22/2011   Procedure: FLEXIBLE SIGMOIDOSCOPY;  Surgeon: [name], MD;  Location: [facility] ENDOSCOPY;  Service: Endoscopy;  Laterality: N/A;  . HOT HEMOSTASIS  05/22/2011   Procedure: HOT HEMOSTASIS (ARGON PLASMA COAGULATION/BICAP);  Surgeon: [name], MD;  Location: [facility] ENDOSCOPY;  Service: Endoscopy;  Laterality: N/A;  . RADIOLOGY WITH ANESTHESIA  N/A 01/04/2018   Procedure: MRI WITH ANESTHESIA/LUMBAR SPINE WITHOUT CONTRAST;  Surgeon: Radiologist, Medication, MD;  Location: [facility];  Service: Radiology;  Laterality: N/A;   Social History   Occupational History  . Occupation: pharmacist  Tobacco Use  . Smoking status: Former Smoker    Types: Cigarettes  . Smokeless tobacco: Never Used  Substance and Sexual Activity  . Alcohol use: Yes    Comment: occas  . Drug use: No  . Sexual activity: Yes

## 2018-01-15 DIAGNOSIS — E785 Hyperlipidemia, unspecified: Secondary | ICD-10-CM | POA: Diagnosis not present

## 2018-01-15 DIAGNOSIS — I129 Hypertensive chronic kidney disease with stage 1 through stage 4 chronic kidney disease, or unspecified chronic kidney disease: Secondary | ICD-10-CM | POA: Diagnosis not present

## 2018-01-15 DIAGNOSIS — I4891 Unspecified atrial fibrillation: Secondary | ICD-10-CM | POA: Diagnosis not present

## 2018-01-15 DIAGNOSIS — R946 Abnormal results of thyroid function studies: Secondary | ICD-10-CM | POA: Diagnosis not present

## 2018-01-15 DIAGNOSIS — N184 Chronic kidney disease, stage 4 (severe): Secondary | ICD-10-CM | POA: Diagnosis not present

## 2018-01-15 DIAGNOSIS — D631 Anemia in chronic kidney disease: Secondary | ICD-10-CM | POA: Diagnosis not present

## 2018-01-15 DIAGNOSIS — C61 Malignant neoplasm of prostate: Secondary | ICD-10-CM | POA: Diagnosis not present

## 2018-01-15 DIAGNOSIS — I739 Peripheral vascular disease, unspecified: Secondary | ICD-10-CM | POA: Diagnosis not present

## 2018-01-15 DIAGNOSIS — M109 Gout, unspecified: Secondary | ICD-10-CM | POA: Diagnosis not present

## 2018-01-15 DIAGNOSIS — N2581 Secondary hyperparathyroidism of renal origin: Secondary | ICD-10-CM | POA: Diagnosis not present

## 2018-01-15 DIAGNOSIS — Z0000 Encounter for general adult medical examination without abnormal findings: Secondary | ICD-10-CM | POA: Diagnosis not present

## 2018-01-15 DIAGNOSIS — K219 Gastro-esophageal reflux disease without esophagitis: Secondary | ICD-10-CM | POA: Diagnosis not present

## 2018-01-20 DIAGNOSIS — R262 Difficulty in walking, not elsewhere classified: Secondary | ICD-10-CM | POA: Diagnosis not present

## 2018-01-20 DIAGNOSIS — M545 Low back pain: Secondary | ICD-10-CM | POA: Diagnosis not present

## 2018-01-20 DIAGNOSIS — M79605 Pain in left leg: Secondary | ICD-10-CM | POA: Diagnosis not present

## 2018-01-20 DIAGNOSIS — M6281 Muscle weakness (generalized): Secondary | ICD-10-CM | POA: Diagnosis not present

## 2018-01-21 ENCOUNTER — Encounter (INDEPENDENT_AMBULATORY_CARE_PROVIDER_SITE_OTHER): Payer: Self-pay | Admitting: Family Medicine

## 2018-01-24 ENCOUNTER — Encounter (INDEPENDENT_AMBULATORY_CARE_PROVIDER_SITE_OTHER): Payer: Self-pay | Admitting: Family Medicine

## 2018-01-25 ENCOUNTER — Encounter (INDEPENDENT_AMBULATORY_CARE_PROVIDER_SITE_OTHER): Payer: Self-pay | Admitting: Family Medicine

## 2018-01-25 ENCOUNTER — Ambulatory Visit (INDEPENDENT_AMBULATORY_CARE_PROVIDER_SITE_OTHER): Payer: Medicare Other | Admitting: Family Medicine

## 2018-01-25 DIAGNOSIS — M79671 Pain in right foot: Secondary | ICD-10-CM | POA: Diagnosis not present

## 2018-01-25 DIAGNOSIS — M79672 Pain in left foot: Secondary | ICD-10-CM

## 2018-01-25 DIAGNOSIS — M5116 Intervertebral disc disorders with radiculopathy, lumbar region: Secondary | ICD-10-CM

## 2018-01-25 MED ORDER — COLCHICINE 0.6 MG PO TABS: 0.6000 mg | ORAL_TABLET | Freq: Two times a day (BID) | ORAL | 6 refills | Status: DC | PRN

## 2018-01-25 NOTE — Progress Notes (Signed)
Office Visit Note   Patient: Timothy [name]           Date of Birth: 1940-05-17           MRN: [id number] Visit Date: 01/25/2018 Requested by: Crist Infante, MD [address] [place], [place] [address] PCP: Crist Infante, MD  Subjective: Chief Complaint  Patient presents with  . bilateral foot pain/?gout, started w/lt foot last week    HPI: He is here with a couple concerns.  Both of his feet started hurting last week.  He has a history of gout which is been well controlled on allopurinol, but when he was hospitalized last month he was taken off his medication.  He started it about 5 days later and then last week developed gout in both feet.  He has not had an attack in quite a while.  Uric acid has been below 6 usually when checked.  Physical therapy is hoping his back pain with left-sided sciatica, but he is still in quite a bit of discomfort and is wondering whether he might need another epidural injection.                ROS: Otherwise noncontributory.  Objective: Vital Signs: There were no vitals taken for this visit.  Physical Exam:  Feet: Erythema, slight warmth and swelling of both feet at the MTP joint of the first toes.  Very painful with active movement.  Imaging: None today.  Assessment & Plan: 1.  Bilateral first MTP gout attack -Trial of colchicine.  If not improving in the next couple days, then cortisone injections.  2.  Low back pain with sciatica -Continue physical therapy.  Epidural injection if fails to improve.   Follow-Up Instructions: No follow-ups on file.      Procedures: No procedures performed  No notes on file    PMFS History: Patient Active Problem List   Diagnosis Date Noted  . GI bleed 01/04/2018  . Upper GI bleed 01/02/2018  . Coffee ground emesis 01/02/2018  . AKI (acute kidney injury) ([facility]) 01/02/2018  . Leukocytosis 01/02/2018  . Gout 01/02/2018  . Lumbar back pain 12/24/2017  . Sinus bradycardia   . Prostate cancer  ([facility])   . Peripheral vascular disease ([facility])   . Paroxysmal atrial fibrillation ([facility])   . Kidney disease   . HLD (hyperlipidemia)   . GERD (gastroesophageal reflux disease)   . Diverticulosis   . Chronic renal failure   . Bright red rectal bleeding   . Anal fissure   . PAF (paroxysmal atrial fibrillation) ([facility]) 11/28/2016  . Near syncope 11/28/2016  . Palpitations 10/28/2016  . Transient cerebral ischemia 10/28/2016  . Mixed hyperlipidemia 10/28/2016  . C2 cervical fracture ([facility]) 06/24/2014  . HTN (hypertension) 06/05/2011  . H/O prostate cancer 06/05/2011  . 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 Hemorrhage of rectum and anus 05/09/2011   Past Medical History:  Diagnosis Date  . Anal fissure    ?  . Bright red rectal bleeding   . C2 cervical fracture ([facility]) 06/24/2014  . Chronic renal failure   . Diverticulosis   . GERD (gastroesophageal reflux disease)   . H/O prostate cancer 06/05/2011   S/p radiation rx 2010   . Hemorrhage of rectum and anus 05/09/2011  . HLD (hyperlipidemia)   . HTN (hypertension)   . Kidney disease    acute glomeral nepritis  . Paroxysmal atrial fibrillation ([facility])   . Peripheral vascular disease ([facility])    carotid artery stent (  right  )  . Prostate cancer ([facility])   . 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 Sinus bradycardia     Family History  Problem Relation Age of Onset  . Brain cancer Brother   . Atrial fibrillation Brother   . Colon cancer Neg Hx     Past Surgical History:  Procedure Laterality Date  . CAROTID STENT    . ESOPHAGOGASTRODUODENOSCOPY (EGD) WITH PROPOFOL N/A 01/05/2018   Procedure: ESOPHAGOGASTRODUODENOSCOPY (EGD) WITH PROPOFOL;  Surgeon: [name], MD;  Location: [facility] ENDOSCOPY;  Service: Endoscopy;  Laterality: N/A;  . external beam radiation to prostate  2009  . FLEXIBLE SIGMOIDOSCOPY  05/22/2011   Procedure: FLEXIBLE SIGMOIDOSCOPY;  Surgeon: [name], MD;  Location: [facility] ENDOSCOPY;  Service: Endoscopy;   Laterality: N/A;  . HOT HEMOSTASIS  05/22/2011   Procedure: HOT HEMOSTASIS (ARGON PLASMA COAGULATION/BICAP);  Surgeon: [name], MD;  Location: [facility] ENDOSCOPY;  Service: Endoscopy;  Laterality: N/A;  . RADIOLOGY WITH ANESTHESIA N/A 01/04/2018   Procedure: MRI WITH ANESTHESIA/LUMBAR SPINE WITHOUT CONTRAST;  Surgeon: Radiologist, Medication, MD;  Location: [facility];  Service: Radiology;  Laterality: N/A;   Social History   Occupational History  . Occupation: pharmacist  Tobacco Use  . Smoking status: Former Smoker    Types: Cigarettes  . Smokeless tobacco: Never Used  Substance and Sexual Activity  . Alcohol use: Yes    Comment: occas  . Drug use: No  . Sexual activity: Yes

## 2018-01-27 ENCOUNTER — Telehealth: Payer: Self-pay | Admitting: Gastroenterology

## 2018-01-28 ENCOUNTER — Encounter (INDEPENDENT_AMBULATORY_CARE_PROVIDER_SITE_OTHER): Payer: Self-pay | Admitting: Family Medicine

## 2018-01-28 DIAGNOSIS — M6281 Muscle weakness (generalized): Secondary | ICD-10-CM | POA: Diagnosis not present

## 2018-01-28 DIAGNOSIS — R262 Difficulty in walking, not elsewhere classified: Secondary | ICD-10-CM | POA: Diagnosis not present

## 2018-01-28 DIAGNOSIS — M545 Low back pain: Secondary | ICD-10-CM | POA: Diagnosis not present

## 2018-01-28 DIAGNOSIS — M79605 Pain in left leg: Secondary | ICD-10-CM | POA: Diagnosis not present

## 2018-01-28 NOTE — Telephone Encounter (Signed)
Hi Briana, pls disregard the previous message. Pt's wife called today and I moved her husband's appt up to tomorrow as Dr. Lyndel Safe had a cancellation. Thank you.

## 2018-01-29 ENCOUNTER — Telehealth (INDEPENDENT_AMBULATORY_CARE_PROVIDER_SITE_OTHER): Payer: Self-pay | Admitting: Family Medicine

## 2018-01-29 ENCOUNTER — Ambulatory Visit (INDEPENDENT_AMBULATORY_CARE_PROVIDER_SITE_OTHER): Payer: Medicare Other | Admitting: Gastroenterology

## 2018-01-29 ENCOUNTER — Encounter: Payer: Self-pay | Admitting: Gastroenterology

## 2018-01-29 VITALS — BP 142/80 | HR 56 | Ht 69.0 in | Wt 158.0 lb

## 2018-01-29 DIAGNOSIS — Z8719 Personal history of other diseases of the digestive system: Secondary | ICD-10-CM | POA: Diagnosis not present

## 2018-01-29 DIAGNOSIS — Z1211 Encounter for screening for malignant neoplasm of colon: Secondary | ICD-10-CM

## 2018-01-29 DIAGNOSIS — M5116 Intervertebral disc disorders with radiculopathy, lumbar region: Secondary | ICD-10-CM

## 2018-01-29 NOTE — Patient Instructions (Signed)
If you are age 75 or older, your body mass index should be between 23-30. Your Body mass index is 23.33 kg/m. If this is out of the aforementioned range listed, please consider follow up with your Primary Care Provider.  If you are age 2 or younger, your body mass index should be between 19-25. Your Body mass index is 23.33 kg/m. If this is out of the aformentioned range listed, please consider follow up with your Primary Care Provider.   You have been scheduled for an endoscopy and colonoscopy. Please follow the written instructions given to you at your visit today. Please pick up your prep supplies at the pharmacy within the next 1-3 days. If you use inhalers (even only as needed), please bring them with you on the day of your procedure. Your physician has requested that you go to www.startemmi.com and enter the access code given to you at your visit today. This web site gives a general overview about your procedure. However, you should still follow specific instructions given to you by our office regarding your preparation for the procedure.  You will be contacted by our office prior to your procedure for directions on holding your Eliquis.  If you do not hear from our office 1 week prior to your scheduled procedure, please call [phone] to discuss.     Thank you,  Dr. [name]

## 2018-01-29 NOTE — Progress Notes (Signed)
Chief Complaint: FU  Referring Provider:  Crist Infante, MD      ASSESSMENT AND PLAN;   #1.  UGI bleeding d/t gastric polyp s/p endoscopic treatment 01/05/2018. Hb stable.  #2.  Colorectal cancer screening.  Plan: - For now continue omeprazole 20mg  QD.  May switch him to H2 blockers thereafter. - EGD and colon with miralax prep off Elquis x 2 days prior (due to CRI) after clearance from Dr Joylene Draft.  I have explained him the risks and benefits.  HPI:    Timothy [name] is a 77 y.o. male  For follow-up visit Most recently had hemoglobin of 12.2 on 01/18/2018 per patient and patient's wife. No GI complaints No further melena or hematochezia. Doing very well from GI standpoint. Wants to get colonoscopy performed as well.  Still having back problems and is scheduled to have epidural injection in coming weeks.   Past Medical History:  Diagnosis Date  . Anal fissure    ?  . Bright red rectal bleeding   . C2 cervical fracture ([facility]) 06/24/2014  . Chronic renal failure   . Diverticulosis   . GERD (gastroesophageal reflux disease)   . H/O prostate cancer 06/05/2011   S/p radiation rx 2010   . Hemorrhage of rectum and anus 05/09/2011  . HLD (hyperlipidemia)   . HTN (hypertension)   . Kidney disease    acute glomeral nepritis  . Paroxysmal atrial fibrillation ([facility])   . Peripheral vascular disease ([facility])    carotid artery stent ( right  )  . Prostate cancer ([facility])   . 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 Sinus bradycardia     Past Surgical History:  Procedure Laterality Date  . CAROTID STENT    . ESOPHAGOGASTRODUODENOSCOPY (EGD) WITH PROPOFOL N/A 01/05/2018   Procedure: ESOPHAGOGASTRODUODENOSCOPY (EGD) WITH PROPOFOL;  Surgeon: [name], MD;  Location: [facility]COPY;  Service: Endoscopy;  Laterality: N/A;  . external beam radiation to prostate  2009  . FLEXIBLE SIGMOIDOSCOPY  05/22/2011   Procedure: FLEXIBLE SIGMOIDOSCOPY;  Surgeon: [name], MD;  Location: [facility]  ENDOSCOPY;  Service: Endoscopy;  Laterality: N/A;  . HOT HEMOSTASIS  05/22/2011   Procedure: HOT HEMOSTASIS (ARGON PLASMA COAGULATION/BICAP);  Surgeon: [name], MD;  Location: [facility] ENDOSCOPY;  Service: Endoscopy;  Laterality: N/A;  . RADIOLOGY WITH ANESTHESIA N/A 01/04/2018   Procedure: MRI WITH ANESTHESIA/LUMBAR SPINE WITHOUT CONTRAST;  Surgeon: Radiologist, Medication, MD;  Location: [facility];  Service: Radiology;  Laterality: N/A;    Family History  Problem Relation Age of Onset  . Brain cancer Brother   . Atrial fibrillation Brother   . Colon cancer Neg Hx     Social History   Tobacco Use  . Smoking status: Former Smoker    Types: Cigarettes  . Smokeless tobacco: Never Used  Substance Use Topics  . Alcohol use: Yes    Comment: occas  . Drug use: No    Current Outpatient Medications  Medication Sig Dispense Refill  . allopurinol (ZYLOPRIM) 300 MG tablet Take 300 mg by mouth daily.     Marland Kitchen amLODipine (NORVASC) 10 MG tablet Take 10 mg by mouth daily.    Marland Kitchen apixaban (ELIQUIS) 5 MG TABS tablet Take 10 mg by mouth 2 (two) times daily.    Marland Kitchen ezetimibe (ZETIA) 10 MG tablet Take 10 mg by mouth daily.    . furosemide (LASIX) 20 MG tablet Take 20 mg by mouth daily.    Marland Kitchen gabapentin (NEURONTIN) 300 MG capsule Take  1 capsule (300 mg total) by mouth 3 (three) times daily. 90 capsule 0  . omeprazole (PRILOSEC) 20 MG capsule Take 20 mg by mouth daily.    Marland Kitchen oxyCODONE-acetaminophen (PERCOCET) 7.5-325 MG tablet Take 1 tablet by mouth every 4 (four) hours as needed for severe pain. 30 tablet 0  . TELMISARTAN PO Take 40 mg by mouth daily.      No current facility-administered medications for this visit.     Allergies  Allergen Reactions  . Contrast Media [Iodinated Diagnostic Agents]     Review of Systems:  Negative except for HPI.     Physical Exam:    BP (!) 142/80   Pulse (!) 56   Ht 5\' 9"  (1.753 m)   Wt 158 lb (71.7 kg)   BMI 23.33 kg/m  Filed Weights   01/29/18 1554    Weight: 158 lb (71.7 kg)   Constitutional:  Well-developed, in no acute distress. Psychiatric: Normal mood and affect. Behavior is normal. HEENT: Pupils normal.  Conjunctivae are normal. No scleral icterus. Neck supple.  Cardiovascular: Normal rate, regular rhythm. No edema Pulmonary/chest: Effort normal and breath sounds normal. No wheezing, rales or rhonchi. Abdominal: Soft, nondistended. Nontender. Bowel sounds active throughout. There are no masses palpable. No hepatomegaly. Rectal:  defered Neurological: Alert and oriented to person place and time. Skin: Skin is warm and dry. No rashes noted.  Data Reviewed: I have personally reviewed following labs and imaging studies  CBC: CBC Latest Ref Rng & Units 01/07/2018 01/06/2018 01/06/2018  WBC 4.0 - 10.5 K/uL 5.6 - 5.2  Hemoglobin 13.0 - 17.0 g/dL 9.9(L) 10.9(L) 9.6(L)  Hematocrit 39.0 - 52.0 % 32.2(L) 33.4(L) 30.4(L)  Platelets 150 - 400 K/uL 135(L) - 122(L)    CMP: CMP Latest Ref Rng & Units 01/07/2018 01/06/2018 01/05/2018  Glucose 70 - 99 mg/dL 89 89 133(H)  BUN 8 - 23 mg/dL 19 20 23   Creatinine 0.61 - 1.24 mg/dL 1.58(H) 1.38(H) 1.46(H)  Sodium 135 - 145 mmol/L 139 138 132(L)  Potassium 3.5 - 5.1 mmol/L 4.5 4.5 4.6  Chloride 98 - 111 mmol/L 106 110 103  CO2 22 - 32 mmol/L 28 23 23   Calcium 8.9 - 10.3 mg/dL 8.6(L) 8.3(L) 8.3(L)  Total Protein 6.5 - 8.1 g/dL - 5.4(L) 5.8(L)  Total Bilirubin 0.3 - 1.2 mg/dL - 0.7 0.6  Alkaline Phos 38 - 126 U/L - 50 57  AST 15 - 41 U/L - 16 17  ALT 0 - 44 U/L - 15 13    GFR: CrCl cannot be calculated (Patient's most recent lab result is older than the maximum 21 days allowed.). Liver Function Tests:    Radiology Studies: Mr Lumbar Spine Wo Contrast  Result Date: 01/04/2018 CLINICAL DATA:  Low back pain.  No known injury EXAM: MRI LUMBAR SPINE WITHOUT CONTRAST TECHNIQUE: Multiplanar, multisequence MR imaging of the lumbar spine was performed. No intravenous contrast was  administered. COMPARISON:  None. FINDINGS: Segmentation:  Standard. Alignment: Minimal retrolisthesis of L3 on L4. Grade 1 anterolisthesis of L4 on L5 secondary to facet disease. Vertebrae:  No fracture, evidence of discitis, or bone lesion. Conus medullaris and cauda equina: Conus extends to the L1 level. Conus and cauda equina appear normal. Paraspinal and other soft tissues: No acute paraspinal abnormality. Small left renal cyst. Disc levels: Disc spaces: Degenerative disc disease with disc height loss at L5-S1. Disc desiccation at L1-2, L3-4 and L4-5. T12-L1: No significant disc bulge. No evidence of neural foraminal stenosis. No central canal  stenosis. L1-L2: Mild broad-based disc bulge. Mild bilateral facet arthropathy. Mild spinal stenosis. No evidence of neural foraminal stenosis. L2-L3: Broad-based disc bulge. Moderate bilateral facet arthropathy with bilateral facet effusions. Mild spinal stenosis and bilateral lateral recess stenosis. Mild bilateral foraminal stenosis. L3-L4: Broad-based disc bulge with a central broad disc extrusion and left paracentral cranial migration of disc material along the L3 vertebral body with mass effect on the left intraspinal L4 nerve root and possibly contacting the left L3 nerve root. Moderate left foraminal stenosis. No right foraminal stenosis. Moderate bilateral facet arthropathy with facet effusions. Moderate-severe spinal stenosis. L4-L5: Broad-based disc bulge. Severe bilateral facet arthropathy. Moderate spinal stenosis and bilateral lateral recess stenosis. Mild bilateral foraminal stenosis. L5-S1: Broad-based disc bulge. Moderate bilateral facet arthropathy. Mild bilateral foraminal stenosis. No central canal stenosis. IMPRESSION: 1. At L3-4 there is a broad-based disc bulge with a central broad disc extrusion diffuse lumbar spine spondylosis as described above. And left paracentral cranial migration of disc material along the L3 vertebral body with mass effect on  the left intraspinal L4 nerve root and possibly contacting the left L3 nerve root. Moderate left foraminal stenosis. No right foraminal stenosis. Moderate bilateral facet arthropathy with facet effusions. Moderate-severe spinal stenosis. 2. Electronically Signed   By: Kathreen Devoid   On: 01/04/2018 11:46   Mr Lumbar Spine Wo Contrast  Result Date: 01/02/2018 CLINICAL DATA:  Back pain radiating to left leg. EXAM: MRI LUMBAR SPINE WITHOUT CONTRAST TECHNIQUE: Multiplanar, multisequence MR imaging of the lumbar spine was performed. No intravenous contrast was administered. COMPARISON:  Lumbar spine radiograph 12/24/2017 FINDINGS: The examination was discontinued before completion. There is grade 1 anterolisthesis at L4-L5. The examination otherwise shows normal alignment without evidence of compression fracture. IMPRESSION: Grade 1 anterolisthesis at L4-L5. Otherwise, discontinued examination with limited diagnostic information. Electronically Signed   By: Ulyses Jarred M.D.   On: 01/02/2018 18:18   Xr C-arm No Report  Result Date: 01/11/2018 Please see Notes tab for imaging impression.  US Liver Doppler  Result Date: 01/06/2018 CLINICAL DATA:  77 year old male with a bleeding gastric polyp. Evaluate for evidence of portal hypertension. EXAM: DUPLEX ULTRASOUND OF LIVER TECHNIQUE: Color and duplex Doppler ultrasound was performed to evaluate the hepatic in-flow and out-flow vessels. COMPARISON:  None. FINDINGS: Liver: Mildly echogenic hepatic parenchyma with coarsening of the echotexture. No discrete hepatic lesion is identified. Normal hepatic contour without nodularity. No focal lesion, mass or intrahepatic biliary ductal dilatation. Portal Vein Velocities Main:  38-65 cm/sec Right:  31 cm/sec Left:  22 cm/sec Hepatic Vein Velocities Right:  24 cm/sec Middle:  25 cm/sec Left:  17 cm/sec IVC: Present and patent with normal respiratory phasicity. Hepatic Artery Velocity:  178 cm/sec Splenic Vein Velocity:   46 cm/sec Varices: None visualized Ascites: None Spleen: Normal splenic size.  No discrete lesion. Other: Incidentally imaged renal parenchyma appears echogenic suggesting underlying medical renal disease. IMPRESSION: 1. Hepatic steatosis. 2. Widely patent portal veins without ultrasound evidence of portal hypertension. 3. Echogenic renal parenchyma suggests underlying medical renal disease. Electronically Signed   By: Jacqulynn Cadet M.D.   On: 01/06/2018 16:14      Carmell Austria, MD 01/29/2018, 4:08 PM  Cc: Crist Infante, MD

## 2018-01-29 NOTE — Telephone Encounter (Signed)
Another ESI ordered.

## 2018-02-01 ENCOUNTER — Encounter (INDEPENDENT_AMBULATORY_CARE_PROVIDER_SITE_OTHER): Payer: Self-pay | Admitting: Family Medicine

## 2018-02-01 DIAGNOSIS — M5116 Intervertebral disc disorders with radiculopathy, lumbar region: Secondary | ICD-10-CM

## 2018-02-02 ENCOUNTER — Other Ambulatory Visit (INDEPENDENT_AMBULATORY_CARE_PROVIDER_SITE_OTHER): Payer: Self-pay | Admitting: Family Medicine

## 2018-02-02 ENCOUNTER — Ambulatory Visit: Payer: Medicare Other | Admitting: Gastroenterology

## 2018-02-02 DIAGNOSIS — M5116 Intervertebral disc disorders with radiculopathy, lumbar region: Secondary | ICD-10-CM

## 2018-02-03 NOTE — Progress Notes (Signed)
Timothy [name] - 77 y.o. male MRN [id number]  Date of birth: 11-11-1940  Office Visit Note: Visit Date: 01/11/2018 PCP: Crist Infante, MD Referred by: Crist Infante, MD  Subjective: Chief Complaint  Patient presents with  . Lower Back - Pain  . Left Hip - Pain  . Left Leg - Pain   HPI:  CORRIE BRANNEN is a 77 y.o. male who comes in today For planned left L3 transforaminal epidural steroid injection as requested by Dr. Basil Dess.  Patient is having lower back and left hip pain with no groin pain.  This seems to stop around the knee area.  This started in October 2019 with 8 out of 10 pain.  Pain is worse lying down with feet elevated but a heating pad and medication helped to a small degree.  MRI is reviewed.  Patient will follow-up with Dr. Basil Dess.  Injection will be diagnostic and hopefully therapeutic using fluoroscopic guidance.  Patient has failed conservative care otherwise.  ROS Otherwise per HPI.  Assessment & Plan: Visit Diagnoses:  1. Lumbar radiculopathy     Plan: No additional findings.   Meds & Orders:  Meds ordered this encounter  Medications  . betamethasone acetate-betamethasone sodium phosphate (CELESTONE) injection 12 mg    Orders Placed This Encounter  Procedures  . XR C-ARM NO REPORT  . Epidural Steroid injection    Follow-up: Return for Basil Dess, M.D..   Procedures: No procedures performed  Lumbosacral Transforaminal Epidural Steroid Injection - Sub-Pedicular Approach with Fluoroscopic Guidance  Patient: DELLAS GUARD      Date of Birth: [birth year] MRN: [id number] PCP: Crist Infante, MD      Visit Date: 01/11/2018   Universal Protocol:    Date/Time: 01/11/2018  Consent Given By: the patient  Position: PRONE  Additional Comments: Vital signs were monitored before and after the procedure. Patient was prepped and draped in the usual sterile fashion. The correct patient, procedure, and site was verified.   Injection Procedure  Details:  Procedure Site One Meds Administered:  Meds ordered this encounter  Medications  . betamethasone acetate-betamethasone sodium phosphate (CELESTONE) injection 12 mg    Laterality: Left  Location/Site:  L3-L4  Needle size: 22 G  Needle type: Spinal  Needle Placement: Transforaminal  Findings:    -Comments: Good flow of contrast in the foramen and nerve root.  Patient had difficulty with the positioning on the table and had significant nerve pain trying to lay prone.  We did try to get him into a more obliqued body position but even this was painful.  Injection did work well placed otherwise.  Procedure Details: After squaring off the end-plates to get a true AP view, the C-arm was positioned so that an oblique view of the foramen as noted above was visualized. The target area is just inferior to the "nose of the scotty dog" or sub pedicular. The soft tissues overlying this structure were infiltrated with 2-3 ml. of 1% Lidocaine without Epinephrine.  The spinal needle was inserted toward the target using a "trajectory" view along the fluoroscope beam.  Under AP and lateral visualization, the needle was advanced so it did not puncture dura and was located close the 6 O'Clock position of the pedical in AP tracterory. Biplanar projections were used to confirm position. Aspiration was confirmed to be negative for CSF and/or blood. A 1-2 ml. volume of Isovue-250 was injected and flow of contrast was noted at each level. Radiographs were obtained for documentation  purposes.   After attaining the desired flow of contrast documented above, a 0.5 to 1.0 ml test dose of 0.25% Marcaine was injected into each respective transforaminal space.  The patient was observed for 90 seconds post injection.  After no sensory deficits were reported, and normal lower extremity motor function was noted,   the above injectate was administered so that equal amounts of the injectate were placed at each  foramen (level) into the transforaminal epidural space.   Additional Comments:  No complications occurred Dressing: Band-Aid    Post-procedure details: Patient was observed during the procedure. Post-procedure instructions were reviewed.  Patient left the clinic in stable condition.     Clinical History: MRI LUMBAR SPINE WITHOUT CONTRAST  TECHNIQUE: Multiplanar, multisequence MR imaging of the lumbar spine was performed. No intravenous contrast was administered.  COMPARISON:  None.  FINDINGS: Segmentation:  Standard.  Alignment: Minimal retrolisthesis of L3 on L4. Grade 1 anterolisthesis of L4 on L5 secondary to facet disease.  Vertebrae:  No fracture, evidence of discitis, or bone lesion.  Conus medullaris and cauda equina: Conus extends to the L1 level. Conus and cauda equina appear normal.  Paraspinal and other soft tissues: No acute paraspinal abnormality. Small left renal cyst.  Disc levels:  Disc spaces: Degenerative disc disease with disc height loss at L5-S1. Disc desiccation at L1-2, L3-4 and L4-5.  T12-L1: No significant disc bulge. No evidence of neural foraminal stenosis. No central canal stenosis.  L1-L2: Mild broad-based disc bulge. Mild bilateral facet arthropathy. Mild spinal stenosis. No evidence of neural foraminal stenosis.  L2-L3: Broad-based disc bulge. Moderate bilateral facet arthropathy with bilateral facet effusions. Mild spinal stenosis and bilateral lateral recess stenosis. Mild bilateral foraminal stenosis.  L3-L4: Broad-based disc bulge with a central broad disc extrusion and left paracentral cranial migration of disc material along the L3 vertebral body with mass effect on the left intraspinal L4 nerve root and possibly contacting the left L3 nerve root. Moderate left foraminal stenosis. No right foraminal stenosis. Moderate bilateral facet arthropathy with facet effusions. Moderate-severe  spinal stenosis.  L4-L5: Broad-based disc bulge. Severe bilateral facet arthropathy. Moderate spinal stenosis and bilateral lateral recess stenosis. Mild bilateral foraminal stenosis.  L5-S1: Broad-based disc bulge. Moderate bilateral facet arthropathy. Mild bilateral foraminal stenosis. No central canal stenosis.  IMPRESSION: 1. At L3-4 there is a broad-based disc bulge with a central broad disc extrusion diffuse lumbar spine spondylosis as described above. And left paracentral cranial migration of disc material along the L3 vertebral body with mass effect on the left intraspinal L4 nerve root and possibly contacting the left L3 nerve root. Moderate left foraminal stenosis. No right foraminal stenosis. Moderate bilateral facet arthropathy with facet effusions. Moderate-severe spinal stenosis. 2.   Electronically Signed   By: Kathreen Devoid   On: 01/04/2018 11:46     Objective:  VS:  HT:    WT:   BMI:     BP:140/70  HR: bpm  TEMP:98.4 F (36.9 C)(Oral)  RESP:  Physical Exam  Ortho Exam Imaging: No results found.

## 2018-02-07 ENCOUNTER — Encounter (INDEPENDENT_AMBULATORY_CARE_PROVIDER_SITE_OTHER): Payer: Self-pay | Admitting: Family Medicine

## 2018-02-08 ENCOUNTER — Ambulatory Visit (INDEPENDENT_AMBULATORY_CARE_PROVIDER_SITE_OTHER): Payer: Medicare Other | Admitting: Physical Medicine and Rehabilitation

## 2018-02-08 ENCOUNTER — Encounter (INDEPENDENT_AMBULATORY_CARE_PROVIDER_SITE_OTHER): Payer: Self-pay | Admitting: Physical Medicine and Rehabilitation

## 2018-02-08 ENCOUNTER — Ambulatory Visit (INDEPENDENT_AMBULATORY_CARE_PROVIDER_SITE_OTHER): Payer: Self-pay

## 2018-02-08 ENCOUNTER — Telehealth (INDEPENDENT_AMBULATORY_CARE_PROVIDER_SITE_OTHER): Payer: Self-pay | Admitting: Family Medicine

## 2018-02-08 ENCOUNTER — Encounter

## 2018-02-08 VITALS — BP 150/74 | HR 66 | Temp 98.2°F

## 2018-02-08 DIAGNOSIS — M5416 Radiculopathy, lumbar region: Secondary | ICD-10-CM | POA: Diagnosis not present

## 2018-02-08 DIAGNOSIS — M5116 Intervertebral disc disorders with radiculopathy, lumbar region: Secondary | ICD-10-CM

## 2018-02-08 MED ORDER — BETAMETHASONE SOD PHOS & ACET 6 (3-3) MG/ML IJ SUSP
12.0000 mg | Freq: Once | INTRAMUSCULAR | Status: AC
  Administered 2018-02-08: 12 mg

## 2018-02-08 MED ORDER — OXYCODONE-ACETAMINOPHEN 7.5-325 MG PO TABS: 1.0000 | ORAL_TABLET | ORAL | 0 refills | Status: DC | PRN

## 2018-02-08 NOTE — Procedures (Signed)
Lumbosacral Transforaminal Epidural Steroid Injection - Sub-Pedicular Approach with Fluoroscopic Guidance  Patient: Timothy [name]      Date of Birth: Jul 14, 1940 MRN: [id number] PCP: Crist Infante, MD      Visit Date: 02/08/2018   Universal Protocol:    Date/Time: 02/08/2018  Consent Given By: the patient  Position: PRONE, rolled up in left oblique position  Additional Comments: Vital signs were monitored before and after the procedure. Patient was prepped and draped in the usual sterile fashion. The correct patient, procedure, and site was verified.   Injection Procedure Details:  Procedure Site One Meds Administered:  Meds ordered this encounter  Medications  . betamethasone acetate-betamethasone sodium phosphate (CELESTONE) injection 12 mg    Laterality: Left  Location/Site:  L3-L4  Needle size: 22 G  Needle type: Spinal  Needle Placement: Transforaminal  Findings:    -Comments: Excellent flow of contrast along the nerve and into the epidural space.  Procedure Details: After squaring off the end-plates to get a true AP view, the C-arm was positioned so that an oblique view of the foramen as noted above was visualized. The target area is just inferior to the "nose of the scotty dog" or sub pedicular. The soft tissues overlying this structure were infiltrated with 2-3 ml. of 1% Lidocaine without Epinephrine.  The spinal needle was inserted toward the target using a "trajectory" view along the fluoroscope beam.  Under AP and lateral visualization, the needle was advanced so it did not puncture dura and was located close the 6 O'Clock position of the pedical in AP tracterory. Biplanar projections were used to confirm position. Aspiration was confirmed to be negative for CSF and/or blood. A 1-2 ml. volume of Isovue-250 was injected and flow of contrast was noted at each level. Radiographs were obtained for documentation purposes.   After attaining the desired flow of  contrast documented above, a 0.5 to 1.0 ml test dose of 0.25% Marcaine was injected into each respective transforaminal space.  The patient was observed for 90 seconds post injection.  After no sensory deficits were reported, and normal lower extremity motor function was noted,   the above injectate was administered so that equal amounts of the injectate were placed at each foramen (level) into the transforaminal epidural space.   Additional Comments:  The patient tolerated the procedure well Dressing: antiobiotic ointment    Post-procedure details: Patient was observed during the procedure. Post-procedure instructions were reviewed.  Patient left the clinic in stable condition.

## 2018-02-08 NOTE — Telephone Encounter (Signed)
Please do Add CBC

## 2018-02-08 NOTE — Progress Notes (Signed)
Timothy [name] - 77 y.o. male MRN [id number]  Date of birth: [birth year]  Office Visit Note: Visit Date: 02/08/2018 PCP: Crist Infante, MD Referred by: Crist Infante, MD  Subjective: Chief Complaint  Patient presents with  . Lower Back - Pain  . Left Leg - Pain   HPI:  Timothy [name] is a 77 y.o. male who comes in today For planned repeat left L3 transforaminal epidural steroid injection.  Patient originally was seeing Dr. Jeneen Rinks neck in the office and now seen Dr. Legrand Como hilts.  Prior left L3 injection was problematic because the patient had a difficult time trying to lay prone.  This time he did take some oxycodone prior to the injection.  He also is doing somewhat better than last saw him last time from a pain standpoint.  He is able to lay on his sides at night still has difficulty laying on his back or prone.  Has disc herniation at L3-4 likely impacting L3 and L4 nerve root.  Symptoms in the leg are both L3 and may be L4.  We will go ahead and repeat the L3 transforaminal injection and would consider L4 transforaminal injection.  Patient has listed allergy to contrast media although he denies any allergy.  We did use contrast with the last injection he had no issues.  ROS Otherwise per HPI.  Assessment & Plan: Visit Diagnoses:  1. Lumbar radiculopathy   2. Radiculopathy due to lumbar intervertebral disc disorder     Plan: No additional findings.   Meds & Orders:  Meds ordered this encounter  Medications  . betamethasone acetate-betamethasone sodium phosphate (CELESTONE) injection 12 mg    Orders Placed This Encounter  Procedures  . XR C-ARM NO REPORT  . Epidural Steroid injection    Follow-up: No follow-ups on file.   Procedures: No procedures performed  Lumbosacral Transforaminal Epidural Steroid Injection - Sub-Pedicular Approach with Fluoroscopic Guidance  Patient: Timothy [name]      Date of Birth: 1940/08/09 MRN: [id number] PCP: Crist Infante, MD      Visit Date:  02/08/2018   Universal Protocol:    Date/Time: 02/08/2018  Consent Given By: the patient  Position: PRONE, rolled up in left oblique position  Additional Comments: Vital signs were monitored before and after the procedure. Patient was prepped and draped in the usual sterile fashion. The correct patient, procedure, and site was verified.   Injection Procedure Details:  Procedure Site One Meds Administered:  Meds ordered this encounter  Medications  . betamethasone acetate-betamethasone sodium phosphate (CELESTONE) injection 12 mg    Laterality: Left  Location/Site:  L3-L4  Needle size: 22 G  Needle type: Spinal  Needle Placement: Transforaminal  Findings:    -Comments: Excellent flow of contrast along the nerve and into the epidural space.  Procedure Details: After squaring off the end-plates to get a true AP view, the C-arm was positioned so that an oblique view of the foramen as noted above was visualized. The target area is just inferior to the "nose of the scotty dog" or sub pedicular. The soft tissues overlying this structure were infiltrated with 2-3 ml. of 1% Lidocaine without Epinephrine.  The spinal needle was inserted toward the target using a "trajectory" view along the fluoroscope beam.  Under AP and lateral visualization, the needle was advanced so it did not puncture dura and was located close the 6 O'Clock position of the pedical in AP tracterory. Biplanar projections were used to confirm position. Aspiration was confirmed  to be negative for CSF and/or blood. A 1-2 ml. volume of Isovue-250 was injected and flow of contrast was noted at each level. Radiographs were obtained for documentation purposes.   After attaining the desired flow of contrast documented above, a 0.5 to 1.0 ml test dose of 0.25% Marcaine was injected into each respective transforaminal space.  The patient was observed for 90 seconds post injection.  After no sensory deficits were reported,  and normal lower extremity motor function was noted,   the above injectate was administered so that equal amounts of the injectate were placed at each foramen (level) into the transforaminal epidural space.   Additional Comments:  The patient tolerated the procedure well Dressing: antiobiotic ointment    Post-procedure details: Patient was observed during the procedure. Post-procedure instructions were reviewed.  Patient left the clinic in stable condition.    Clinical History: MRI LUMBAR SPINE WITHOUT CONTRAST  TECHNIQUE: Multiplanar, multisequence MR imaging of the lumbar spine was performed. No intravenous contrast was administered.  COMPARISON:  None.  FINDINGS: Segmentation:  Standard.  Alignment: Minimal retrolisthesis of L3 on L4. Grade 1 anterolisthesis of L4 on L5 secondary to facet disease.  Vertebrae:  No fracture, evidence of discitis, or bone lesion.  Conus medullaris and cauda equina: Conus extends to the L1 level. Conus and cauda equina appear normal.  Paraspinal and other soft tissues: No acute paraspinal abnormality. Small left renal cyst.  Disc levels:  Disc spaces: Degenerative disc disease with disc height loss at L5-S1. Disc desiccation at L1-2, L3-4 and L4-5.  T12-L1: No significant disc bulge. No evidence of neural foraminal stenosis. No central canal stenosis.  L1-L2: Mild broad-based disc bulge. Mild bilateral facet arthropathy. Mild spinal stenosis. No evidence of neural foraminal stenosis.  L2-L3: Broad-based disc bulge. Moderate bilateral facet arthropathy with bilateral facet effusions. Mild spinal stenosis and bilateral lateral recess stenosis. Mild bilateral foraminal stenosis.  L3-L4: Broad-based disc bulge with a central broad disc extrusion and left paracentral cranial migration of disc material along the L3 vertebral body with mass effect on the left intraspinal L4 nerve root and possibly contacting the left L3  nerve root. Moderate left foraminal stenosis. No right foraminal stenosis. Moderate bilateral facet arthropathy with facet effusions. Moderate-severe spinal stenosis.  L4-L5: Broad-based disc bulge. Severe bilateral facet arthropathy. Moderate spinal stenosis and bilateral lateral recess stenosis. Mild bilateral foraminal stenosis.  L5-S1: Broad-based disc bulge. Moderate bilateral facet arthropathy. Mild bilateral foraminal stenosis. No central canal stenosis.  IMPRESSION: 1. At L3-4 there is a broad-based disc bulge with a central broad disc extrusion diffuse lumbar spine spondylosis as described above. And left paracentral cranial migration of disc material along the L3 vertebral body with mass effect on the left intraspinal L4 nerve root and possibly contacting the left L3 nerve root. Moderate left foraminal stenosis. No right foraminal stenosis. Moderate bilateral facet arthropathy with facet effusions. Moderate-severe spinal stenosis. 2.   Electronically Signed   By: Kathreen Devoid   On: 01/04/2018 11:46     Objective:  VS:  HT:    WT:   BMI:     BP:(!) 150/74  HR:66bpm  TEMP:98.2 F (36.8 C)(Oral)  RESP:  Physical Exam  Ortho Exam Imaging: Xr C-arm No Report  Result Date: 02/08/2018 Please see Notes tab for imaging impression.

## 2018-02-08 NOTE — Patient Instructions (Signed)

## 2018-02-08 NOTE — Telephone Encounter (Signed)
Refill sent.

## 2018-02-08 NOTE — Progress Notes (Signed)
.  Numeric Pain Rating Scale and Functional Assessment Average Pain 7   In the last MONTH (on 0-10 scale) has pain interfered with the following?  1. General activity like being  able to carry out your everyday physical activities such as walking, climbing stairs, carrying groceries, or moving a chair?  Rating(5)   +Driver, +BT(eliquis, ok for inj), -Dye Allergies.

## 2018-02-09 DIAGNOSIS — N2581 Secondary hyperparathyroidism of renal origin: Secondary | ICD-10-CM | POA: Diagnosis not present

## 2018-02-09 DIAGNOSIS — M109 Gout, unspecified: Secondary | ICD-10-CM | POA: Diagnosis not present

## 2018-02-09 DIAGNOSIS — N189 Chronic kidney disease, unspecified: Secondary | ICD-10-CM | POA: Diagnosis not present

## 2018-02-09 DIAGNOSIS — I129 Hypertensive chronic kidney disease with stage 1 through stage 4 chronic kidney disease, or unspecified chronic kidney disease: Secondary | ICD-10-CM | POA: Diagnosis not present

## 2018-02-09 DIAGNOSIS — N184 Chronic kidney disease, stage 4 (severe): Secondary | ICD-10-CM | POA: Diagnosis not present

## 2018-02-09 DIAGNOSIS — I4891 Unspecified atrial fibrillation: Secondary | ICD-10-CM | POA: Diagnosis not present

## 2018-02-09 DIAGNOSIS — D631 Anemia in chronic kidney disease: Secondary | ICD-10-CM | POA: Diagnosis not present

## 2018-02-09 DIAGNOSIS — K922 Gastrointestinal hemorrhage, unspecified: Secondary | ICD-10-CM | POA: Diagnosis not present

## 2018-02-09 NOTE — Telephone Encounter (Signed)
02/08/18-Called and left a message on VM for patient to have blood work done at appt today; 02/09/18=called and left a message on VM for patient again; unable to verify if patient is getting lab work done at appt as requested;

## 2018-02-09 NOTE — Telephone Encounter (Signed)
Telephone note created in error. 

## 2018-02-10 ENCOUNTER — Other Ambulatory Visit: Payer: Medicare Other

## 2018-02-11 NOTE — Telephone Encounter (Signed)
Patient's wife called into office-DPR verified-wife wanted to make Dr. Lyndel Safe aware that the CBC he was requesting be done, was done at the kidney appt that was scheduled on 02/09/18; paitent's wife insists she will have the kidney MD office route/submit these results to Dr. Lyndel Safe as soon as they are resulted; patient's wife was thanked for the update and also  advised to call back if questions/concerns arise;

## 2018-02-23 ENCOUNTER — Encounter (INDEPENDENT_AMBULATORY_CARE_PROVIDER_SITE_OTHER): Payer: Self-pay | Admitting: Physical Medicine and Rehabilitation

## 2018-02-25 DIAGNOSIS — N184 Chronic kidney disease, stage 4 (severe): Secondary | ICD-10-CM | POA: Diagnosis not present

## 2018-02-25 DIAGNOSIS — I129 Hypertensive chronic kidney disease with stage 1 through stage 4 chronic kidney disease, or unspecified chronic kidney disease: Secondary | ICD-10-CM | POA: Diagnosis not present

## 2018-03-02 DIAGNOSIS — M545 Low back pain: Secondary | ICD-10-CM | POA: Diagnosis not present

## 2018-03-02 DIAGNOSIS — M6281 Muscle weakness (generalized): Secondary | ICD-10-CM | POA: Diagnosis not present

## 2018-03-02 DIAGNOSIS — M79605 Pain in left leg: Secondary | ICD-10-CM | POA: Diagnosis not present

## 2018-03-02 DIAGNOSIS — R262 Difficulty in walking, not elsewhere classified: Secondary | ICD-10-CM | POA: Diagnosis not present

## 2018-03-04 DIAGNOSIS — M545 Low back pain: Secondary | ICD-10-CM | POA: Diagnosis not present

## 2018-03-04 DIAGNOSIS — R262 Difficulty in walking, not elsewhere classified: Secondary | ICD-10-CM | POA: Diagnosis not present

## 2018-03-04 DIAGNOSIS — M6281 Muscle weakness (generalized): Secondary | ICD-10-CM | POA: Diagnosis not present

## 2018-03-04 DIAGNOSIS — M79605 Pain in left leg: Secondary | ICD-10-CM | POA: Diagnosis not present

## 2018-03-05 ENCOUNTER — Telehealth: Payer: Self-pay | Admitting: Gastroenterology

## 2018-03-05 NOTE — Telephone Encounter (Signed)
I have not received this back yet, I have refaxed it back.

## 2018-03-05 NOTE — Telephone Encounter (Signed)
Called and spoke with patient/patient's wife- wife reports they patient does not know when to hold the Eliquis prior to procedure;   Have we received information from the patient's cardiologist on how long the patient can be off of the Eliquis for his procedure?

## 2018-03-08 NOTE — Telephone Encounter (Signed)
Any updates on this cardiology clearance for the patient to have the procedure on 03/12/2018?

## 2018-03-09 DIAGNOSIS — M545 Low back pain: Secondary | ICD-10-CM | POA: Diagnosis not present

## 2018-03-09 DIAGNOSIS — R262 Difficulty in walking, not elsewhere classified: Secondary | ICD-10-CM | POA: Diagnosis not present

## 2018-03-09 DIAGNOSIS — M6281 Muscle weakness (generalized): Secondary | ICD-10-CM | POA: Diagnosis not present

## 2018-03-09 DIAGNOSIS — M79605 Pain in left leg: Secondary | ICD-10-CM | POA: Diagnosis not present

## 2018-03-09 NOTE — Telephone Encounter (Signed)
I have refaxed this and have confirmation.

## 2018-03-09 NOTE — Telephone Encounter (Signed)
Timothy [name] please refax the HOLD Eliquis information to [phone]. This is an alternate fax for Dr. Silvestre Mesi office; They need this paperwork as they cannot find it at this time;

## 2018-03-09 NOTE — Telephone Encounter (Signed)
Called and spoke with patient's wife-DPR verified-patient's wife reports that the patient needs to know when to hold the Eliquis for his procedure on 03/12/2018; patient's wife, Timothy [name], was informed that Dr. Joylene Draft had been sent a message and called for clearance to hold the medication, however, a response has not been made available to Dr. Steve Rattler office at this time; Timothy [name] was advised that she may be able to call Dr. Silvestre Mesi office and get the office to respond to the request if she wishes; otherwise, Dr. Steve Rattler office is awaiting a response from Dr. Joylene Draft or the MD on call covering Dr. Esmond Harps verbalized understanding of information/instructions; Timothy [name] was advised to call back if questions/concerns arise;

## 2018-03-09 NOTE — Telephone Encounter (Signed)
PT wife concern on when husband should stop taking BT.  JG

## 2018-03-09 NOTE — Telephone Encounter (Signed)
I have contacted Dr. Silvestre Mesi office, they do have our request but the doctor is not in today. I have left a message for the provider on call.

## 2018-03-10 NOTE — Telephone Encounter (Signed)
Per Dr. Joylene Draft patient is to hold Eliquis for two days before the procedure. Patient was notified.

## 2018-03-10 NOTE — Telephone Encounter (Signed)
I have called and left a message for Dr. Beckie Salts CMA to return my call, I have not received anything back regarding patients Eliquis.

## 2018-03-11 DIAGNOSIS — M79605 Pain in left leg: Secondary | ICD-10-CM | POA: Diagnosis not present

## 2018-03-11 DIAGNOSIS — M6281 Muscle weakness (generalized): Secondary | ICD-10-CM | POA: Diagnosis not present

## 2018-03-11 DIAGNOSIS — M545 Low back pain: Secondary | ICD-10-CM | POA: Diagnosis not present

## 2018-03-11 DIAGNOSIS — R262 Difficulty in walking, not elsewhere classified: Secondary | ICD-10-CM | POA: Diagnosis not present

## 2018-03-12 ENCOUNTER — Ambulatory Visit (AMBULATORY_SURGERY_CENTER): Payer: Medicare Other | Admitting: Gastroenterology

## 2018-03-12 ENCOUNTER — Encounter: Payer: Self-pay | Admitting: Gastroenterology

## 2018-03-12 VITALS — BP 110/66 | HR 51 | Temp 97.8°F | Resp 19

## 2018-03-12 DIAGNOSIS — K317 Polyp of stomach and duodenum: Secondary | ICD-10-CM

## 2018-03-12 DIAGNOSIS — K219 Gastro-esophageal reflux disease without esophagitis: Secondary | ICD-10-CM | POA: Diagnosis not present

## 2018-03-12 DIAGNOSIS — K2980 Duodenitis without bleeding: Secondary | ICD-10-CM

## 2018-03-12 DIAGNOSIS — Z8719 Personal history of other diseases of the digestive system: Secondary | ICD-10-CM

## 2018-03-12 DIAGNOSIS — Z1211 Encounter for screening for malignant neoplasm of colon: Secondary | ICD-10-CM

## 2018-03-12 DIAGNOSIS — K3189 Other diseases of stomach and duodenum: Secondary | ICD-10-CM | POA: Diagnosis not present

## 2018-03-12 DIAGNOSIS — K2270 Barrett's esophagus without dysplasia: Secondary | ICD-10-CM | POA: Diagnosis not present

## 2018-03-12 MED ORDER — SODIUM CHLORIDE 0.9 % IV SOLN: 500.0000 mL | Freq: Once | INTRAVENOUS | Status: DC

## 2018-03-12 NOTE — Op Note (Signed)
[facility] Patient Name: Timothy [name] Procedure Date: 03/12/2018 10:02 AM MRN: [id number] Endoscopist: [name] , MD Age: 78 Referring MD:  Date of Birth: Mar 05, 1940 Gender: Male Account #: [id number] Procedure:                Colonoscopy Indications:              Screening for colorectal malignant neoplasm Medicines:                Monitored Anesthesia Care Procedure:                Pre-Anesthesia Assessment:                           - Prior to the procedure, a History and Physical                            was performed, and patient medications and                            allergies were reviewed. The patient's tolerance of                            previous anesthesia was also reviewed. The risks                            and benefits of the procedure and the sedation                            options and risks were discussed with the patient.                            All questions were answered, and informed consent                            was obtained. Prior Anticoagulants: The patient has                            taken Eliquis (apixaban), last dose was 2 days                            prior to procedure. ASA Grade Assessment: II - A                            patient with mild systemic disease. After reviewing                            the risks and benefits, the patient was deemed in                            satisfactory condition to undergo the procedure.                           After obtaining informed consent, the colonoscope  was passed under direct vision. Throughout the                            procedure, the patient's blood pressure, pulse, and                            oxygen saturations were monitored continuously. The                            Model CF-HQ190L [id number]) scope was introduced                            through the anus and advanced to the 1 cm into the                            ileum.  The colonoscopy was performed without                            difficulty. The patient tolerated the procedure                            well. The quality of the bowel preparation was good. Scope In: 10:20:55 AM Scope Out: 10:36:48 AM Scope Withdrawal Time: 0 hours 10 minutes 44 seconds  Total Procedure Duration: 0 hours 15 minutes 53 seconds  Findings:                 A few small-mouthed diverticula were found in the                            sigmoid colon.                           Non-bleeding internal hemorrhoids were found. The                            hemorrhoids were small. Mild telangiectasia in the                            anterior rectum suggestive of mild radiation                            changes.                           The exam was otherwise without abnormality. Complications:            No immediate complications. Estimated Blood Loss:     Estimated blood loss: none. Impression:               - Mild sigmoid diverticulosis.                           - Non-bleeding internal hemorrhoids.                           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 Recommendation:           -  Patient has a contact number available for                            emergencies. The signs and symptoms of potential                            delayed complications were discussed with the                            patient. Return to normal activities tomorrow.                            Written discharge instructions were provided to the                            patient.                           - Resume previous diet.                           - Continue present medications.                           - Resume Eliquis from tomorrow onwards.                           - Return to GI clinic PRN. [name], MD 03/12/2018 10:41:46 AM This report has been signed electronically.

## 2018-03-12 NOTE — Op Note (Signed)
[facility] Patient Name: Timothy [name] Procedure Date: 03/12/2018 10:02 AM MRN: [id number] Endoscopist: [name] , MD Age: 78 Referring MD:  Date of Birth: [birth year] Gender: Male Account #: [id number] Procedure:                Upper GI endoscopy Indications:              H/O upper GI bleeding due to gastric polyp is post                            endoscopic therapy. GERD Medicines:                Monitored Anesthesia Care Procedure:                Pre-Anesthesia Assessment:                           - Prior to the procedure, a History and Physical                            was performed, and patient medications and                            allergies were reviewed. The patient's tolerance of                            previous anesthesia was also reviewed. The risks                            and benefits of the procedure and the sedation                            options and risks were discussed with the patient.                            All questions were answered, and informed consent                            was obtained. Prior Anticoagulants: The patient has                            taken Eliquis (apixaban), last dose was 2 days                            prior to procedure. ASA Grade Assessment: II - A                            patient with mild systemic disease. After reviewing                            the risks and benefits, the patient was deemed in                            satisfactory condition to undergo the procedure.  After obtaining informed consent, the endoscope was                            passed under direct vision. Throughout the                            procedure, the patient's blood pressure, pulse, and                            oxygen saturations were monitored continuously. The                            Endoscope was introduced through the mouth, and                            advanced to the second  part of duodenum. The upper                            GI endoscopy was accomplished without difficulty.                            The patient tolerated the procedure well. Scope In: Scope Out: Findings:                 The Z-line was irregular and was found 38 cm from                            the incisors. Biopsies were taken with a cold                            forceps for histology directed by [facility] . Estimated                            blood loss: none.                           Small hiatal hernia was present.                           Multiple (10-15) 6 to 8 mm sessile polyps with no                            bleeding were found in the cardia, in the gastric                            fundus and in the gastric body. 3 polyps were                            removed using cold biopsy forceps.                           Two 6 to 8 mm sessile submucosal appearing polyps  with no bleeding were found in the first portion of                            the duodenum. Multiple biopsies were obtained and                            sent for histology. Impression of the duodenum was                            normal. Complications:            No immediate complications. Estimated Blood Loss:     Estimated blood loss: none. Impression:               - Z-line irregular, 38 cm from the incisors.                            Biopsied.                           - Small hiatal hernia.                           - Gastric polyps (status post polypectomy x3 )                           - Two submucosal duodenal polyps (biopsied). Recommendation:           - Patient has a contact number available for                            emergencies. The signs and symptoms of potential                            delayed complications were discussed with the                            patient. Return to normal activities tomorrow.                            Written discharge instructions  were provided to the                            patient.                           - Resume previous diet.                           - Continue omeprazole 20 mg p.o. once a day.                           - Await pathology results.                           - Resume Eliquis (apixaban) at prior dose tomorrow  onwards.                           - FU in GI clinic in 6 to 8 weeks. [name], MD 03/12/2018 10:54:57 AM This report has been signed electronically.

## 2018-03-12 NOTE — Progress Notes (Signed)
A and O x3. Report to RN. Tolerated MAC anesthesia well.Teeth unchanged after procedure.

## 2018-03-12 NOTE — Patient Instructions (Signed)
YOU HAD AN ENDOSCOPIC PROCEDURE TODAY AT [facility]:   Refer to the procedure report that was given to you for any specific questions about what was found during the examination.  If the procedure report does not answer your questions, please call your gastroenterologist to clarify.  If you requested that your care partner not be given the details of your procedure findings, then the procedure report has been included in a sealed envelope for you to review at your convenience later.  YOU SHOULD EXPECT: Some feelings of bloating in the abdomen. Passage of more gas than usual.  Walking can help get rid of the air that was put into your GI tract during the procedure and reduce the bloating. If you had a lower endoscopy (such as a colonoscopy or flexible sigmoidoscopy) you may notice spotting of blood in your stool or on the toilet paper. If you underwent a bowel prep for your procedure, you may not have a normal bowel movement for a few days.  Please Note:  You might notice some irritation and congestion in your nose or some drainage.  This is from the oxygen used during your procedure.  There is no need for concern and it should clear up in a day or so.  SYMPTOMS TO REPORT IMMEDIATELY:   Following lower endoscopy (colonoscopy or flexible sigmoidoscopy):  Excessive amounts of blood in the stool  Significant tenderness or worsening of abdominal pains  Swelling of the abdomen that is new, acute  Fever of 100F or higher   Following upper endoscopy (EGD)  Vomiting of blood or coffee ground material  New chest pain or pain under the shoulder blades  Painful or persistently difficult swallowing  New shortness of breath  Fever of 100F or higher  Black, tarry-looking stools  For urgent or emergent issues, a gastroenterologist can be reached at any hour by calling [phone].   DIET:  We do recommend a small meal at first, but then you may proceed to your regular diet.  Drink  plenty of fluids but you should avoid alcoholic beverages for 24 hours.try to eat  High fiber diet, and drink plenty of water.  Read handout about the reflux diet.  ACTIVITY:  You should plan to take it easy for the rest of today and you should NOT DRIVE or use heavy machinery until tomorrow (because of the sedation medicines used during the test).    FOLLOW UP: Our staff will call the number listed on your records the next business day following your procedure to check on you and address any questions or concerns that you may have regarding the information given to you following your procedure. If we do not reach you, we will leave a message.  However, if you are feeling well and you are not experiencing any problems, there is no need to return our call.  We will assume that you have returned to your regular daily activities without incident.  If any biopsies were taken you will be contacted by phone or by letter within the next 1-3 weeks.  Please call us at [phone] if you have not heard about the biopsies in 3 weeks.    SIGNATURES/CONFIDENTIALITY: You and/or your care partner have signed paperwork which will be entered into your electronic medical record.  These signatures attest to the fact that that the information above on your After Visit Summary has been reviewed and is understood.  Full responsibility of the confidentiality of this discharge information  lies with you and/or your care-partner.  Take your reflux medication 1/2 hour before breakfast every day.   You may resume you Eliquis tomorrow.

## 2018-03-12 NOTE — Progress Notes (Signed)
Called to room to assist during endoscopic procedure.  Patient ID and intended procedure confirmed with present staff. Received instructions for my participation in the procedure from the performing physician.  

## 2018-03-15 ENCOUNTER — Telehealth: Payer: Self-pay

## 2018-03-15 NOTE — Telephone Encounter (Signed)
  Follow up Call-  Call back number 03/12/2018  Post procedure Call Back phone  # [phone]  Permission to leave phone message Yes  Some recent data might be hidden     Patient questions:  Do you have a fever, pain , or abdominal swelling? No. Pain Score  0 *  Have you tolerated food without any problems? Yes.    Have you been able to return to your normal activities? Yes.    Do you have any questions about your discharge instructions: Diet   No. Medications  No. Follow up visit  No.  Do you have questions or concerns about your Care? No.  Actions: * If pain score is 4 or above: No action needed, pain <4.

## 2018-03-16 DIAGNOSIS — M545 Low back pain: Secondary | ICD-10-CM | POA: Diagnosis not present

## 2018-03-16 DIAGNOSIS — R262 Difficulty in walking, not elsewhere classified: Secondary | ICD-10-CM | POA: Diagnosis not present

## 2018-03-16 DIAGNOSIS — M6281 Muscle weakness (generalized): Secondary | ICD-10-CM | POA: Diagnosis not present

## 2018-03-16 DIAGNOSIS — M79605 Pain in left leg: Secondary | ICD-10-CM | POA: Diagnosis not present

## 2018-03-18 ENCOUNTER — Encounter: Payer: Self-pay | Admitting: Gastroenterology

## 2018-03-18 DIAGNOSIS — M79605 Pain in left leg: Secondary | ICD-10-CM | POA: Diagnosis not present

## 2018-03-18 DIAGNOSIS — M545 Low back pain: Secondary | ICD-10-CM | POA: Diagnosis not present

## 2018-03-18 DIAGNOSIS — M6281 Muscle weakness (generalized): Secondary | ICD-10-CM | POA: Diagnosis not present

## 2018-03-18 DIAGNOSIS — R262 Difficulty in walking, not elsewhere classified: Secondary | ICD-10-CM | POA: Diagnosis not present

## 2018-03-23 DIAGNOSIS — K1379 Other lesions of oral mucosa: Secondary | ICD-10-CM | POA: Diagnosis not present

## 2018-03-23 DIAGNOSIS — M6281 Muscle weakness (generalized): Secondary | ICD-10-CM | POA: Diagnosis not present

## 2018-03-23 DIAGNOSIS — M79605 Pain in left leg: Secondary | ICD-10-CM | POA: Diagnosis not present

## 2018-03-23 DIAGNOSIS — K090 Developmental odontogenic cysts: Secondary | ICD-10-CM | POA: Diagnosis not present

## 2018-03-23 DIAGNOSIS — M545 Low back pain: Secondary | ICD-10-CM | POA: Diagnosis not present

## 2018-03-23 DIAGNOSIS — R262 Difficulty in walking, not elsewhere classified: Secondary | ICD-10-CM | POA: Diagnosis not present

## 2018-05-07 ENCOUNTER — Telehealth: Payer: Self-pay | Admitting: Gastroenterology

## 2018-05-07 NOTE — Telephone Encounter (Signed)
Pt just cancelled f/u appt that was scheduled on Monday 3/16 with Dr. Lyndel Safe due to latest guidelines about the coronavirus, pt prefers to stay home. He has r/s for 5/1 and stated that if Dr. Lyndel Safe would like pt to do something in regards to treatment before his appt to please call him.

## 2018-05-11 ENCOUNTER — Ambulatory Visit: Payer: Medicare Other | Admitting: Gastroenterology

## 2018-05-13 NOTE — Telephone Encounter (Signed)
Absolutely Lets postpone the visit

## 2018-05-27 ENCOUNTER — Encounter (INDEPENDENT_AMBULATORY_CARE_PROVIDER_SITE_OTHER): Payer: Self-pay | Admitting: Family Medicine

## 2018-05-27 ENCOUNTER — Other Ambulatory Visit: Payer: Self-pay

## 2018-05-27 ENCOUNTER — Ambulatory Visit (INDEPENDENT_AMBULATORY_CARE_PROVIDER_SITE_OTHER): Payer: Medicare Other | Admitting: Family Medicine

## 2018-05-27 ENCOUNTER — Telehealth (INDEPENDENT_AMBULATORY_CARE_PROVIDER_SITE_OTHER): Payer: Self-pay | Admitting: Family Medicine

## 2018-05-27 DIAGNOSIS — M25551 Pain in right hip: Secondary | ICD-10-CM

## 2018-05-27 NOTE — Progress Notes (Signed)
I spoke to the patient this morning, 15-minute phone call.  He has pain in his right posterior hip for the past 10 to 14 days with no injury.  Pain can be severe at times, usually worse when he first gets up in the morning and starts walking and then better as the day goes on or when he takes Tylenol.  Pain occasionally radiates into the testicles.  He does not have much in the way of groin pain, no pain on the lateral hip and no sciatica symptoms.  He did exceptionally well with his lumbar epidural injection several months ago.  This pain is different.  He was concerned about the possibility of hip arthritis requiring hip replacement.  I was able to visualize both of his hips on his lumbar AP view this past fall, and he has well-preserved joint spaces bilaterally.  My suspicion is that his pain is either muscular, or related to his spondylolisthesis.  Right now Tylenol is adequate for pain control.  I will email him some postural exercises to try at home.  If symptoms worsen he will contact me and I will call in some pain medicine for him.  Otherwise he will follow-up as needed.

## 2018-05-27 NOTE — Telephone Encounter (Signed)
I spoke to the patient this morning, 15-minute phone call.  He has pain in his right posterior hip for the past 10 to 14 days with no injury.  Pain can be severe at times, usually worse when he first gets up in the morning and starts walking and then better as the day goes on or when he takes Tylenol.  Pain occasionally radiates into the testicles.  He does not have much in the way of groin pain, no pain on the lateral hip and no sciatica symptoms.  He did exceptionally well with his lumbar epidural injection several months ago.  This pain is different.  He was concerned about the possibility of hip arthritis requiring hip replacement.  I was able to visualize both of his hips on his lumbar AP view this past fall, and he has well-preserved joint spaces bilaterally.  My suspicion is that his pain is either muscular, or related to his spondylolisthesis.  Right now Tylenol is adequate for pain control.  I will email him some postural exercises to try at home.  If symptoms worsen he will contact me and I will call in some pain medicine for him.  Otherwise he will follow-up as needed.

## 2018-06-05 ENCOUNTER — Encounter (INDEPENDENT_AMBULATORY_CARE_PROVIDER_SITE_OTHER): Payer: Self-pay | Admitting: Family Medicine

## 2018-06-14 ENCOUNTER — Other Ambulatory Visit: Payer: Self-pay

## 2018-06-14 ENCOUNTER — Telehealth (INDEPENDENT_AMBULATORY_CARE_PROVIDER_SITE_OTHER): Payer: Medicare Other | Admitting: Gastroenterology

## 2018-06-14 ENCOUNTER — Encounter: Payer: Self-pay | Admitting: Gastroenterology

## 2018-06-14 VITALS — Ht 69.0 in

## 2018-06-14 DIAGNOSIS — K219 Gastro-esophageal reflux disease without esophagitis: Secondary | ICD-10-CM

## 2018-06-14 DIAGNOSIS — Z8719 Personal history of other diseases of the digestive system: Secondary | ICD-10-CM

## 2018-06-14 NOTE — Patient Instructions (Addendum)
Colace 1 tablet p.o. daily on PRN basis as he has been doing already   He is closely followed by Dr. Clair Gulling Deterding is getting routine blood work every 4 to 6 months   Follow-up in the GI clinic in case of any GI problems  Recommend repeating EGD 02/2021 for follow-up of Barrett's and duodenal submucosal lesions/polyps ( Recall in )    Thank You for choosing [facility]  Dr Lyndel Safe

## 2018-06-14 NOTE — Progress Notes (Signed)
Chief Complaint: FU  Referring Provider:  Crist Infante, MD      ASSESSMENT AND PLAN;   #1. GERD with small hiatal hernia, short segment Barrett's esophagus on EGD 02/2018  #2. H/O UGI bleed d/t gastric polyp s/p endoscopic treatment 12/2017.  Hb stable (resolved).  #3.  Occasional constipation. Neg colon 02/2018.  Has mild radiation proctitis.  #4.  Comorbid conditions. A.fib on Eliquis, CRI (Dr Deterding), HTN, HLD  Plan: -Colace 1 tablet p.o. daily on PRN basis. -He is closely followed by Dr. Clair Gulling Deterding is getting routine blood work every 4 to 6 months. -Continue omeprazole 20 mg p.o. once a day. -FU in the GI clinic in case of any GI problems. -Recommend repeating EGD 02/2021 for follow-up of Barrett's and duodenal submucosal lesions/polyps.    HPI:    Timothy [name] is a 78 y.o. male  Retired Software engineer. For follow-up visit No GI complaints except for occasional constipation and straining. No nausea, vomiting, heartburn, regurgitation, odynophagia or dysphagia.  No significant diarrhea. There is no melena or hematochezia. No unintentional weight loss. No abdominal pain. Doing very well.  Most recently had hemoglobin 12.2 on 01/18/2018.   Past GI procedures: -Colonoscopy 02/2018-mild sigmoid diverticulosis, mild radiation proctitis, small internal hemorrhoids. -EGD 03/12/2018-irregular Z line, small hiatal hernia, gastric polyp status post polypectomy x3 (Bx-fundic gland polyps), 2 submucosal duodenal polyps with negative biopsies. Declined EUS. Past Medical History:  Diagnosis Date  . Anal fissure    ?  . Bright red rectal bleeding   . C2 cervical fracture ([place]) 06/24/2014  . Chronic renal failure   . Diverticulosis   . GERD (gastroesophageal reflux disease)   . H/O prostate cancer 06/05/2011   S/p radiation rx 2010   . Hemorrhage of rectum and anus 05/09/2011  . HLD (hyperlipidemia)   . HTN (hypertension)   . Kidney disease    acute glomeral nepritis  .  Paroxysmal atrial fibrillation (HCC)   . Peripheral vascular disease ([place])    carotid artery stent ( right  )  . Prostate cancer ([place])   . 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 Sinus bradycardia     Past Surgical History:  Procedure Laterality Date  . CAROTID STENT    . ESOPHAGOGASTRODUODENOSCOPY (EGD) WITH PROPOFOL N/A 01/05/2018   Procedure: ESOPHAGOGASTRODUODENOSCOPY (EGD) WITH PROPOFOL;  Surgeon: [name], MD;  Location: [facility];  Service: Endoscopy;  Laterality: N/A;  . external beam radiation to prostate  2009  . FLEXIBLE SIGMOIDOSCOPY  05/22/2011   Procedure: FLEXIBLE SIGMOIDOSCOPY;  Surgeon: [name], MD;  Location: [facility];  Service: Endoscopy;  Laterality: N/A;  . HOT HEMOSTASIS  05/22/2011   Procedure: HOT HEMOSTASIS (ARGON PLASMA COAGULATION/BICAP);  Surgeon: [name], MD;  Location: [facility];  Service: Endoscopy;  Laterality: N/A;  . RADIOLOGY WITH ANESTHESIA N/A 01/04/2018   Procedure: MRI WITH ANESTHESIA/LUMBAR SPINE WITHOUT CONTRAST;  Surgeon: Radiologist, Medication, MD;  Location: [facility];  Service: Radiology;  Laterality: N/A;    Family History  Problem Relation Age of Onset  . Brain cancer Brother   . Atrial fibrillation Brother   . Colon cancer Neg Hx     Social History   Tobacco Use  . Smoking status: Former Smoker    Types: Cigarettes  . Smokeless tobacco: Never Used  Substance Use Topics  . Alcohol use: Yes    Comment: occas  . Drug use: No    Current Outpatient Medications  Medication Sig Dispense Refill  .  allopurinol (ZYLOPRIM) 300 MG tablet Take 300 mg by mouth daily.     Marland Kitchen amLODipine (NORVASC) 10 MG tablet Take 10 mg by mouth daily.    Marland Kitchen apixaban (ELIQUIS) 5 MG TABS tablet Take 10 mg by mouth 2 (two) times daily.    Marland Kitchen ezetimibe (ZETIA) 10 MG tablet Take 10 mg by mouth daily.    . furosemide (LASIX) 20 MG tablet Take 20 mg by mouth daily.    Marland Kitchen gabapentin (NEURONTIN) 300 MG capsule Take 1 capsule (300 mg total)  by mouth 3 (three) times daily. 90 capsule 0  . omeprazole (PRILOSEC) 20 MG capsule Take 20 mg by mouth daily.    . TELMISARTAN PO Take 40 mg by mouth daily.      No current facility-administered medications for this visit.     Allergies  Allergen Reactions  . Contrast Media [Iodinated Diagnostic Agents]     esi x2 with no reaction 12/2017 and 01/2018    Review of Systems:  neg     Physical Exam:    Ht 5\' 9"  (1.753 m)   BMI 23.33 kg/m  There were no vitals filed for this visit. Not examined since it was a tele-visit.  This service was provided via telemedicine.  The patient was located at home.  The provider was located in office.  The patient did consent to this televisit and is aware of possible charges through their insurance for this visit.   Time spent on call and coordination of care: 25 min     Carmell Austria, MD 06/14/2018, 9:58 AM  Cc: Crist Infante, MD

## 2018-06-18 DIAGNOSIS — D631 Anemia in chronic kidney disease: Secondary | ICD-10-CM | POA: Diagnosis not present

## 2018-06-18 DIAGNOSIS — I779 Disorder of arteries and arterioles, unspecified: Secondary | ICD-10-CM | POA: Diagnosis not present

## 2018-06-18 DIAGNOSIS — I4891 Unspecified atrial fibrillation: Secondary | ICD-10-CM | POA: Diagnosis not present

## 2018-06-18 DIAGNOSIS — N2581 Secondary hyperparathyroidism of renal origin: Secondary | ICD-10-CM | POA: Diagnosis not present

## 2018-06-18 DIAGNOSIS — Z7901 Long term (current) use of anticoagulants: Secondary | ICD-10-CM | POA: Diagnosis not present

## 2018-06-18 DIAGNOSIS — E785 Hyperlipidemia, unspecified: Secondary | ICD-10-CM | POA: Diagnosis not present

## 2018-06-18 DIAGNOSIS — K922 Gastrointestinal hemorrhage, unspecified: Secondary | ICD-10-CM | POA: Diagnosis not present

## 2018-06-18 DIAGNOSIS — N184 Chronic kidney disease, stage 4 (severe): Secondary | ICD-10-CM | POA: Diagnosis not present

## 2018-06-18 DIAGNOSIS — M109 Gout, unspecified: Secondary | ICD-10-CM | POA: Diagnosis not present

## 2018-06-18 DIAGNOSIS — C61 Malignant neoplasm of prostate: Secondary | ICD-10-CM | POA: Diagnosis not present

## 2018-06-18 DIAGNOSIS — I129 Hypertensive chronic kidney disease with stage 1 through stage 4 chronic kidney disease, or unspecified chronic kidney disease: Secondary | ICD-10-CM | POA: Diagnosis not present

## 2018-06-18 DIAGNOSIS — K219 Gastro-esophageal reflux disease without esophagitis: Secondary | ICD-10-CM | POA: Diagnosis not present

## 2018-06-21 ENCOUNTER — Encounter (INDEPENDENT_AMBULATORY_CARE_PROVIDER_SITE_OTHER): Payer: Self-pay | Admitting: Family Medicine

## 2018-06-25 ENCOUNTER — Ambulatory Visit: Payer: Medicare Other | Admitting: Gastroenterology

## 2018-08-06 DIAGNOSIS — Z20828 Contact with and (suspected) exposure to other viral communicable diseases: Secondary | ICD-10-CM | POA: Diagnosis not present

## 2018-09-22 DIAGNOSIS — Z7901 Long term (current) use of anticoagulants: Secondary | ICD-10-CM | POA: Diagnosis not present

## 2018-09-22 DIAGNOSIS — I129 Hypertensive chronic kidney disease with stage 1 through stage 4 chronic kidney disease, or unspecified chronic kidney disease: Secondary | ICD-10-CM | POA: Diagnosis not present

## 2018-09-22 DIAGNOSIS — D631 Anemia in chronic kidney disease: Secondary | ICD-10-CM | POA: Diagnosis not present

## 2018-09-22 DIAGNOSIS — N184 Chronic kidney disease, stage 4 (severe): Secondary | ICD-10-CM | POA: Diagnosis not present

## 2018-09-22 DIAGNOSIS — N189 Chronic kidney disease, unspecified: Secondary | ICD-10-CM | POA: Diagnosis not present

## 2018-09-22 DIAGNOSIS — E785 Hyperlipidemia, unspecified: Secondary | ICD-10-CM | POA: Diagnosis not present

## 2018-09-22 DIAGNOSIS — K922 Gastrointestinal hemorrhage, unspecified: Secondary | ICD-10-CM | POA: Diagnosis not present

## 2018-09-22 DIAGNOSIS — M109 Gout, unspecified: Secondary | ICD-10-CM | POA: Diagnosis not present

## 2018-09-22 DIAGNOSIS — C61 Malignant neoplasm of prostate: Secondary | ICD-10-CM | POA: Diagnosis not present

## 2018-09-22 DIAGNOSIS — I779 Disorder of arteries and arterioles, unspecified: Secondary | ICD-10-CM | POA: Diagnosis not present

## 2018-09-22 DIAGNOSIS — N2581 Secondary hyperparathyroidism of renal origin: Secondary | ICD-10-CM | POA: Diagnosis not present

## 2018-09-22 DIAGNOSIS — K219 Gastro-esophageal reflux disease without esophagitis: Secondary | ICD-10-CM | POA: Diagnosis not present

## 2018-09-22 DIAGNOSIS — I4891 Unspecified atrial fibrillation: Secondary | ICD-10-CM | POA: Diagnosis not present

## 2018-11-11 DIAGNOSIS — M109 Gout, unspecified: Secondary | ICD-10-CM | POA: Diagnosis not present

## 2018-11-11 DIAGNOSIS — Z23 Encounter for immunization: Secondary | ICD-10-CM | POA: Diagnosis not present

## 2018-11-11 DIAGNOSIS — R7301 Impaired fasting glucose: Secondary | ICD-10-CM | POA: Diagnosis not present

## 2018-11-11 DIAGNOSIS — E7849 Other hyperlipidemia: Secondary | ICD-10-CM | POA: Diagnosis not present

## 2018-11-11 DIAGNOSIS — R946 Abnormal results of thyroid function studies: Secondary | ICD-10-CM | POA: Diagnosis not present

## 2018-11-17 DIAGNOSIS — L814 Other melanin hyperpigmentation: Secondary | ICD-10-CM | POA: Diagnosis not present

## 2018-11-17 DIAGNOSIS — D485 Neoplasm of uncertain behavior of skin: Secondary | ICD-10-CM | POA: Diagnosis not present

## 2018-11-17 DIAGNOSIS — Z85828 Personal history of other malignant neoplasm of skin: Secondary | ICD-10-CM | POA: Diagnosis not present

## 2018-11-18 DIAGNOSIS — R011 Cardiac murmur, unspecified: Secondary | ICD-10-CM | POA: Diagnosis not present

## 2018-11-18 DIAGNOSIS — K219 Gastro-esophageal reflux disease without esophagitis: Secondary | ICD-10-CM | POA: Diagnosis not present

## 2018-11-18 DIAGNOSIS — Z1331 Encounter for screening for depression: Secondary | ICD-10-CM | POA: Diagnosis not present

## 2018-11-18 DIAGNOSIS — D62 Acute posthemorrhagic anemia: Secondary | ICD-10-CM | POA: Diagnosis not present

## 2018-11-18 DIAGNOSIS — R946 Abnormal results of thyroid function studies: Secondary | ICD-10-CM | POA: Diagnosis not present

## 2018-11-18 DIAGNOSIS — I739 Peripheral vascular disease, unspecified: Secondary | ICD-10-CM | POA: Diagnosis not present

## 2018-11-18 DIAGNOSIS — Z0000 Encounter for general adult medical examination without abnormal findings: Secondary | ICD-10-CM | POA: Diagnosis not present

## 2018-11-18 DIAGNOSIS — H68002 Unspecified Eustachian salpingitis, left ear: Secondary | ICD-10-CM | POA: Diagnosis not present

## 2018-11-18 DIAGNOSIS — C61 Malignant neoplasm of prostate: Secondary | ICD-10-CM | POA: Diagnosis not present

## 2018-11-18 DIAGNOSIS — N184 Chronic kidney disease, stage 4 (severe): Secondary | ICD-10-CM | POA: Diagnosis not present

## 2018-11-18 DIAGNOSIS — I480 Paroxysmal atrial fibrillation: Secondary | ICD-10-CM | POA: Diagnosis not present

## 2018-11-18 DIAGNOSIS — J302 Other seasonal allergic rhinitis: Secondary | ICD-10-CM | POA: Diagnosis not present

## 2018-11-18 DIAGNOSIS — M5117 Intervertebral disc disorders with radiculopathy, lumbosacral region: Secondary | ICD-10-CM | POA: Diagnosis not present

## 2018-11-24 ENCOUNTER — Other Ambulatory Visit: Payer: Self-pay | Admitting: Internal Medicine

## 2018-11-24 DIAGNOSIS — E785 Hyperlipidemia, unspecified: Secondary | ICD-10-CM

## 2018-11-26 DIAGNOSIS — I10 Essential (primary) hypertension: Secondary | ICD-10-CM | POA: Diagnosis not present

## 2018-11-26 DIAGNOSIS — R82998 Other abnormal findings in urine: Secondary | ICD-10-CM | POA: Diagnosis not present

## 2018-11-30 DIAGNOSIS — Z23 Encounter for immunization: Secondary | ICD-10-CM | POA: Diagnosis not present

## 2018-12-01 ENCOUNTER — Other Ambulatory Visit: Payer: Self-pay

## 2018-12-01 ENCOUNTER — Ambulatory Visit
Admission: RE | Admit: 2018-12-01 | Discharge: 2018-12-01 | Disposition: A | Discharge status: Home / Self Care | Payer: Medicare Other | Source: Ambulatory Visit | Attending: Internal Medicine | Admitting: Internal Medicine

## 2018-12-01 DIAGNOSIS — E785 Hyperlipidemia, unspecified: Secondary | ICD-10-CM

## 2018-12-09 ENCOUNTER — Ambulatory Visit (INDEPENDENT_AMBULATORY_CARE_PROVIDER_SITE_OTHER): Payer: Medicare Other | Admitting: Cardiovascular Disease

## 2018-12-09 ENCOUNTER — Other Ambulatory Visit: Payer: Self-pay

## 2018-12-09 ENCOUNTER — Encounter: Payer: Self-pay | Admitting: Cardiovascular Disease

## 2018-12-09 VITALS — BP 140/62 | HR 59 | Ht 69.0 in | Wt 159.8 lb

## 2018-12-09 DIAGNOSIS — I2510 Atherosclerotic heart disease of native coronary artery without angina pectoris: Secondary | ICD-10-CM | POA: Diagnosis not present

## 2018-12-09 DIAGNOSIS — I10 Essential (primary) hypertension: Secondary | ICD-10-CM | POA: Diagnosis not present

## 2018-12-09 DIAGNOSIS — I480 Paroxysmal atrial fibrillation: Secondary | ICD-10-CM

## 2018-12-09 MED ORDER — NITROGLYCERIN 0.4 MG SL SUBL: 0.4000 mg | SUBLINGUAL_TABLET | SUBLINGUAL | 6 refills | Status: DC | PRN

## 2018-12-09 MED ORDER — PROPRANOLOL HCL 10 MG PO TABS: 10.0000 mg | ORAL_TABLET | Freq: Four times a day (QID) | ORAL | 6 refills | Status: DC | PRN

## 2018-12-09 NOTE — Patient Instructions (Addendum)
Medication Instructions:  Your physician has recommended you make the following change in your medication:  USE Nitroglycerin 0.4 mg sublingual as needed for chest discomfort  *If you need a refill on your cardiac medications before your next appointment, please call your pharmacy*   Lab Work: None Ordered    Testing/Procedures: None Ordered   Follow-Up: At [facility], you and your health needs are our priority.  As part of our continuing mission to provide you with exceptional heart care, we have created designated Provider Care Teams.  These Care Teams include your primary Cardiologist (physician) and Advanced Practice Providers (APPs -  Physician Assistants and Nurse Practitioners) who all work together to provide you with the care you need, when you need it.  Your next appointment:   3 months on January 15  The format for your next appointment:   In Person  Provider:   You may see Mertie Moores, MD or one of the following Advanced Practice Providers on your designated Care Team:    Richardson Dopp, PA-C  [place], [place]  Daune Perch, [place]

## 2018-12-09 NOTE — Progress Notes (Signed)
Cardiology Office Note:    Date:  12/09/2018   ID:  Timothy [name], Timothy [name] May 16, 1940, MRN [id number]  PCP:  Crist Infante, MD  Cardiologist:  Mertie Moores, MD    Referring MD: Crist Infante, MD   Problem list 1. Paroxysmal Atrial fib - with post conversion pauses CHADS2VASC  =  5  ( age 78 , HTN, CAD, TIA  2. History of premature ventricular contractions 3.  HTN 4. Hyperlipidemia 5. Carotid artery disease-status post stenting around 2004 6.  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 ( Creatinine = 2.0 July 2018)  7.  Hx of TIA - 10-12 years ago   Chief Complaint  Patient presents with  . Coronary Artery Disease    Previous Notes - Sept. 2018    Timothy [name] is a 78 y.o. male with a hx of PVCs and PACs. Presents now with increase palpitations.  Has been diagnosied with PVCs and PACs in the past   Now, these palpitations are more pronounced.   Has a hx of TIAs in the past  Has classical migraine HA The palpitations last for a few seconds.   Not very severe ,  Is active, does not do any regular organized exercise.  Is able to do his chores and yard work without any issues  Has been on Atenolol since age 80 for palpitations   Oct. 5, 2018:  Timothy [name] is seen with wife, Timothy [name].  Still having some palppitations , last for seconds.  No dizziness, no CP or dyspnea.   30 day monitor showed atrial fib and also show some post conversion pauses.  We have gradually decreased his dose of Atenolol   Echo shows normal LV function with grade 1 diastolic dysfunction , mild TR with mild pulmonary HTN  April 24, 2017: Seen wife , Timothy [name] today   Timothy [name] is seen today for follow-up visit.  He has paroxysmal atrial fibrillation.  He had a treadmill test which showed good chronotropic response but he did have some ST changes.  Follow-up stress Myoview study showed no ST segment changes and showed no evidence of ischemia.  Has some lightheadedness.  No syncope.   Has CKD - Creatine up to 2.8  (  sees Deterding )  Aldactone  Has been stopped , micarditis has been continued.  Has been exercising a little.  Walks 10 min a day  We discussed needing to get more exercise   Oct. 15, 2020   On Timothy [name] was last seen in March, 2019.  He has a history of paroxysmal atrial fibrillation.  He has had a treadmill test which did not show any ischemia.  He recently had a coronary calcium score of 2546 with extensive three-vessel coronary artery calcification.  He is in the 92nd percentile for age/sex matched controls..  No symptoms of chest pain or dysnea. He has PAF. Was on atenolol for years and had been having "gray out periods"   No further presyncopal epidoses since stopping the atenolol .  Has a hx of CKD but creatinine has improved.  Used to exercise quite a bit,  Limited by covid.   Is staying more active now.  Going hiking .  [place] last weekend.   Going to [place]. Timothy [name] tomorrow.    He is never had any episodes of angina.  He has known chronic kidney disease.  He is had slight improvement of his renal function over the past year.  Past Medical History:  Diagnosis Date  .  Anal fissure    ?  . Bright red rectal bleeding   . C2 cervical fracture ([place]) 06/24/2014  . Chronic renal failure   . Diverticulosis   . GERD (gastroesophageal reflux disease)   . H/O prostate cancer 06/05/2011   S/p radiation rx 2010   . Hemorrhage of rectum and anus 05/09/2011  . HLD (hyperlipidemia)   . HTN (hypertension)   . Kidney disease    acute glomeral nepritis  . Paroxysmal atrial fibrillation (HCC)   . Peripheral vascular disease ([place])    carotid artery stent ( right  )  . Prostate cancer ([place])   . 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 Sinus bradycardia     Past Surgical History:  Procedure Laterality Date  . CAROTID STENT    . ESOPHAGOGASTRODUODENOSCOPY (EGD) WITH PROPOFOL N/A 01/05/2018   Procedure: ESOPHAGOGASTRODUODENOSCOPY (EGD) WITH PROPOFOL;  Surgeon: [name], MD;   Location: [facility] END[facility]COPY;  Service: Endoscopy;  Laterality: N/A;  . external beam radiation to prostate  2009  . FLEXIBLE SIGMOIDOSCOPY  05/22/2011   Procedure: FLEXIBLE SIGMOIDOSCOPY;  Surgeon: [name], MD;  Location: [facility];  Service: Endoscopy;  Laterality: N/A;  . HOT HEMOSTASIS  05/22/2011   Procedure: HOT HEMOSTASIS (ARGON PLASMA COAGULATION/BICAP);  Surgeon: [name], MD;  Location: [facility];  Service: Endoscopy;  Laterality: N/A;  . RADIOLOGY WITH ANESTHESIA N/A 01/04/2018   Procedure: MRI WITH ANESTHESIA/LUMBAR SPINE WITHOUT CONTRAST;  Surgeon: Radiologist, Medication, MD;  Location: [facility];  Service: Radiology;  Laterality: N/A;    Current Medications: Current Meds  Medication Sig  . allopurinol (ZYLOPRIM) 300 MG tablet Take 300 mg by mouth daily.   Marland Kitchen amLODipine (NORVASC) 10 MG tablet Take 10 mg by mouth daily.  Marland Kitchen apixaban (ELIQUIS) 5 MG TABS tablet Take 10 mg by mouth 2 (two) times daily.  Marland Kitchen ezetimibe (ZETIA) 10 MG tablet Take 10 mg by mouth daily.  . famotidine (PEPCID) 20 MG tablet Take 20 mg by mouth 2 (two) times daily.  . furosemide (LASIX) 20 MG tablet Take 20 mg by mouth daily.  Marland Kitchen telmisartan (MICARDIS) 80 MG tablet Take 1 tablet by mouth daily.  . [DISCONTINUED] TELMISARTAN PO Take 40 mg by mouth daily.      Allergies:   Crestor [rosuvastatin calcium], Contrast media [iodinated diagnostic agents], and Lipitor [atorvastatin]   Social History   Socioeconomic History  . Marital status: Married    Spouse name: Not on file  . Number of children: 1  . Years of education: Not on file  . Highest education level: Not on file  Occupational History  . Occupation: Software engineer  Social Needs  . Financial resource strain: Not on file  . Food insecurity    Worry: Not on file    Inability: Not on file  . Transportation needs    Medical: Not on file    Non-medical: Not on file  Tobacco Use  . Smoking status: Former Smoker    Types: Cigarettes  .  Smokeless tobacco: Never Used  Substance and Sexual Activity  . Alcohol use: Yes    Comment: occas  . Drug use: No  . Sexual activity: Yes  Lifestyle  . Physical activity    Days per week: Not on file    Minutes per session: Not on file  . Stress: Not on file  Relationships  . Social Herbalist on phone: Not on file    Gets together: Not on file  Attends religious service: Not on file    Active member of club or organization: Not on file    Attends meetings of clubs or organizations: Not on file    Relationship status: Not on file  Other Topics Concern  . Not on file  Social History Narrative  . Not on file     Family History: The patient's family history includes Atrial fibrillation in his brother; Brain cancer in his brother. There is no history of Colon cancer. ROS:   Please see the history of present illness.     All other systems reviewed and are negative.  EKGs/Labs/Other Studies Reviewed:    The following studies were reviewed today: Records and labs from Dr. Joylene Draft  EKG:   December 09, 2018: Sinus bradycardia 59 beats minute.  Occasional premature ventricular contractions.  Nonspecific ST and T wave changes.  Recent Labs: 01/06/2018: ALT 15 01/07/2018: BUN 19; Creatinine, Ser 1.58; Hemoglobin 9.9; Platelets 135; Potassium 4.5; Sodium 139  Recent Lipid Panel No results found for: CHOL, TRIG, HDL, CHOLHDL, VLDL, LDLCALC, LDLDIRECT   Physical Exam: Blood pressure 140/62, pulse (!) 59, height 5\' 9"  (1.753 m), weight 159 lb 12.8 oz (72.5 kg), SpO2 98 %.  GEN:  Well nourished, well developed in no acute distress HEENT: Normal NECK: No JVD; No carotid bruits LYMPHATICS: No lymphadenopathy CARDIAC: RRR , no murmurs, rubs, gallops RESPIRATORY:  Clear to auscultation without rales, wheezing or rhonchi  ABDOMEN: Soft, non-tender, non-distended MUSCULOSKELETAL:  No edema; No deformity  SKIN: Warm and dry NEUROLOGIC:  Alert and oriented x 3      ASSESSMENT:    1. PAF (paroxysmal atrial fibrillation) ([place])   2. Essential hypertension   3. Coronary artery disease involving native coronary artery of native heart without angina pectoris    PLAN:    In order of problems listed above:  1.  Paroxysmal Atrial fib: Timothy [name] continues to have episodes of paroxysmal atrial fibrillation.  We will give him propranolol to take on an as-needed basis.  2.  Coronary artery disease: Timothy [name] has an extremely high coronary calcium score.  The calcium distribution is is in all 3 coronary arteries.  He is a very avid hiker and has never had any episodes of angina.  We discussed possibly doing a coronary CT angiogram but he has a creatinine of 1.8 and he wants to be very cautious about any procedures involving contrast.  At this point he is completely asymptomatic.  I think if we can lower his cholesterol with 1 of the injectable PCSK9 inhibitors, then I think that we can continue to follow him clinically very closely and proceed with further testing if he develops any symptoms.   3.  Hyperlipidemia: His LDL is still slightly elevated at 80.  His goal is 70 given his degree of coronary artery disease.  He is intolerant to all strong statins.  He is on Zetia.  He has been referred to the [facility].  I completely agree with starting him on a PCSK9 inhibitor in addition to the Zetia.   We will see him in 3 months for a follow-up office visit.   Medication Adjustments/Labs and Tests Ordered: Current medicines are reviewed at length with the patient today.  Concerns regarding medicines are outlined above.  Orders Placed This Encounter  Procedures  . EKG 12-Lead   Meds ordered this encounter  Medications  . nitroGLYCERIN (NITROSTAT) 0.4 MG SL tablet    Sig: Place 1 tablet (0.4  mg total) under the tongue every 5 (five) minutes as needed for chest pain.    Dispense:  25 tablet    Refill:  6  . propranolol (INDERAL) 10 MG tablet     Sig: Take 1 tablet (10 mg total) by mouth 4 (four) times daily as needed.    Dispense:  60 tablet    Refill:  6    Signed, Mertie Moores, MD  12/09/2018 5:25 PM    [facility]

## 2018-12-30 DIAGNOSIS — E7849 Other hyperlipidemia: Secondary | ICD-10-CM | POA: Diagnosis not present

## 2018-12-30 DIAGNOSIS — I2510 Atherosclerotic heart disease of native coronary artery without angina pectoris: Secondary | ICD-10-CM | POA: Diagnosis not present

## 2019-01-26 DIAGNOSIS — I779 Disorder of arteries and arterioles, unspecified: Secondary | ICD-10-CM | POA: Diagnosis not present

## 2019-01-26 DIAGNOSIS — N184 Chronic kidney disease, stage 4 (severe): Secondary | ICD-10-CM | POA: Diagnosis not present

## 2019-01-26 DIAGNOSIS — N2581 Secondary hyperparathyroidism of renal origin: Secondary | ICD-10-CM | POA: Diagnosis not present

## 2019-01-26 DIAGNOSIS — N189 Chronic kidney disease, unspecified: Secondary | ICD-10-CM | POA: Diagnosis not present

## 2019-01-26 DIAGNOSIS — K219 Gastro-esophageal reflux disease without esophagitis: Secondary | ICD-10-CM | POA: Diagnosis not present

## 2019-01-26 DIAGNOSIS — M109 Gout, unspecified: Secondary | ICD-10-CM | POA: Diagnosis not present

## 2019-01-26 DIAGNOSIS — E785 Hyperlipidemia, unspecified: Secondary | ICD-10-CM | POA: Diagnosis not present

## 2019-01-26 DIAGNOSIS — C61 Malignant neoplasm of prostate: Secondary | ICD-10-CM | POA: Diagnosis not present

## 2019-01-26 DIAGNOSIS — I129 Hypertensive chronic kidney disease with stage 1 through stage 4 chronic kidney disease, or unspecified chronic kidney disease: Secondary | ICD-10-CM | POA: Diagnosis not present

## 2019-01-26 DIAGNOSIS — I4891 Unspecified atrial fibrillation: Secondary | ICD-10-CM | POA: Diagnosis not present

## 2019-01-26 DIAGNOSIS — D631 Anemia in chronic kidney disease: Secondary | ICD-10-CM | POA: Diagnosis not present

## 2019-01-26 DIAGNOSIS — K922 Gastrointestinal hemorrhage, unspecified: Secondary | ICD-10-CM | POA: Diagnosis not present

## 2019-01-26 DIAGNOSIS — Z7901 Long term (current) use of anticoagulants: Secondary | ICD-10-CM | POA: Diagnosis not present

## 2019-03-05 ENCOUNTER — Ambulatory Visit: Payer: Medicare Other

## 2019-03-06 ENCOUNTER — Other Ambulatory Visit: Payer: Self-pay | Admitting: Cardiovascular Disease

## 2019-03-11 ENCOUNTER — Ambulatory Visit: Payer: Medicare Other | Admitting: Cardiovascular Disease

## 2019-03-13 ENCOUNTER — Ambulatory Visit: Payer: Medicare Other | Attending: Internal Medicine

## 2019-03-13 DIAGNOSIS — Z23 Encounter for immunization: Secondary | ICD-10-CM | POA: Insufficient documentation

## 2019-03-13 NOTE — Progress Notes (Signed)
   Covid-19 Vaccination Clinic  Name:  ARTHA CHIASSON    MRN: [id number] DOB: 1940/09/21  03/13/2019  Mr. Macphail was observed post Covid-19 immunization for 15 MINS without incidence. He was provided with Vaccine Information Sheet and instruction to access the V-Safe system.   Mr. Eckardt was instructed to call 911 with any severe reactions post vaccine: Marland Kitchen Difficulty breathing  . Swelling of your face and throat  . A fast heartbeat  . A bad rash all over your body  . Dizziness and weakness

## 2019-03-17 DIAGNOSIS — Z1212 Encounter for screening for malignant neoplasm of rectum: Secondary | ICD-10-CM | POA: Diagnosis not present

## 2019-03-31 ENCOUNTER — Ambulatory Visit: Payer: Medicare Other

## 2019-04-04 ENCOUNTER — Ambulatory Visit: Payer: Medicare Other | Attending: Internal Medicine

## 2019-04-04 DIAGNOSIS — Z23 Encounter for immunization: Secondary | ICD-10-CM

## 2019-04-04 NOTE — Progress Notes (Signed)
   Covid-19 Vaccination Clinic  Name:  FORRESTER BLANDO    MRN: [id number] DOB: February 15, 1941  04/04/2019  Mr. Conger was observed post Covid-19 immunization for 15 minutes without incidence. He was provided with Vaccine Information Sheet and instruction to access the V-Safe system.   Mr. Dutko was instructed to call 911 with any severe reactions post vaccine: Marland Kitchen Difficulty breathing  . Swelling of your face and throat  . A fast heartbeat  . A bad rash all over your body  . Dizziness and weakness    Immunizations Administered    Name Date Dose VIS Date Route   Pfizer COVID-19 Vaccine 04/04/2019 10:11 AM 0.3 mL 02/04/2019 Intramuscular   Manufacturer: [place]   Lot: [id number]   Lucien: [address]-9

## 2019-04-18 DIAGNOSIS — E7849 Other hyperlipidemia: Secondary | ICD-10-CM | POA: Diagnosis not present

## 2019-04-26 ENCOUNTER — Other Ambulatory Visit: Payer: Self-pay

## 2019-04-26 ENCOUNTER — Ambulatory Visit (INDEPENDENT_AMBULATORY_CARE_PROVIDER_SITE_OTHER): Payer: Medicare Other | Admitting: Cardiovascular Disease

## 2019-04-26 ENCOUNTER — Encounter: Payer: Self-pay | Admitting: Cardiovascular Disease

## 2019-04-26 VITALS — BP 128/46 | HR 50 | Ht 69.0 in | Wt 158.0 lb

## 2019-04-26 DIAGNOSIS — I480 Paroxysmal atrial fibrillation: Secondary | ICD-10-CM | POA: Diagnosis not present

## 2019-04-26 DIAGNOSIS — R001 Bradycardia, unspecified: Secondary | ICD-10-CM | POA: Diagnosis not present

## 2019-04-26 DIAGNOSIS — I455 Other specified heart block: Secondary | ICD-10-CM | POA: Diagnosis not present

## 2019-04-26 NOTE — Patient Instructions (Addendum)
Medication Instructions:  Your physician recommends that you continue on your current medications as directed. Please refer to the Current Medication list given to you today.  *If you need a refill on your cardiac medications before your next appointment, please call your pharmacy*   Lab Work: None Ordered If you have labs (blood work) drawn today and your tests are completely normal, you will receive your results only by: Marland Kitchen MyChart Message (if you have MyChart) OR . A paper copy in the mail If you have any lab test that is abnormal or we need to change your treatment, we will call you to review the results.   Testing/Procedures: None Ordered    Follow-Up: You have been referred to Dr. Lovena Le, EP Cardiologist His scheduler will call you to set up your appointment   At [facility], you and your health needs are our priority.  As part of our continuing mission to provide you with exceptional heart care, we have created designated Provider Care Teams.  These Care Teams include your primary Cardiologist (physician) and Advanced Practice Providers (APPs -  Physician Assistants and Nurse Practitioners) who all work together to provide you with the care you need, when you need it.   Your next appointment:   6 month(s)  The format for your next appointment:   In Person  Provider:   You may see Mertie Moores, MD or one of the following Advanced Practice Providers on your designated Care Team:    Richardson Dopp, PA-C  [place], [place]  Daune Perch, [place]

## 2019-04-26 NOTE — Progress Notes (Signed)
Cardiology Office Note:    Date:  04/26/2019   ID:  Timothy [name], Timothy [name] Jun 20, 1940, MRN [id number]  PCP:  Crist Infante, MD  Cardiologist:  Mertie Moores, MD    Referring MD: Crist Infante, MD   Problem list 1. Paroxysmal Atrial fib - with post conversion pauses CHADS2VASC  =  5  ( age 79 , HTN, CAD, TIA  2. History of premature ventricular contractions 3.  HTN 4. Hyperlipidemia 5. Carotid artery disease-status post stenting around 2004 6.  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 ( Creatinine = 2.0 July 2018)  7.  Hx of TIA - 10-12 years ago   Chief Complaint  Patient presents with  . Atrial Fibrillation    Previous Notes - Sept. 2018    QUANDARIUS NILL is a 79 y.o. male with a hx of PVCs and PACs. Presents now with increase palpitations.  Has been diagnosied with PVCs and PACs in the past   Now, these palpitations are more pronounced.   Has a hx of TIAs in the past  Has classical migraine HA The palpitations last for a few seconds.   Not very severe ,  Is active, does not do any regular organized exercise.  Is able to do his chores and yard work without any issues  Has been on Atenolol since age 37 for palpitations   Oct. 5, 2018:  Darshawn is seen with wife, [name].  Still having some palppitations , last for seconds.  No dizziness, no CP or dyspnea.   30 day monitor showed atrial fib and also show some post conversion pauses.  We have gradually decreased his dose of Atenolol   Echo shows normal LV function with grade 1 diastolic dysfunction , mild TR with mild pulmonary HTN  April 24, 2017: Seen wife , [name] today   Zenia Resides is seen today for follow-up visit.  He has paroxysmal atrial fibrillation.  He had a treadmill test which showed good chronotropic response but he did have some ST changes.  Follow-up stress Myoview study showed no ST segment changes and showed no evidence of ischemia.  Has some lightheadedness.  No syncope.   Has CKD - Creatine up to 2.8  ( sees  Deterding )  Aldactone  Has been stopped , micarditis has been continued.  Has been exercising a little.  Walks 10 min a day  We discussed needing to get more exercise   Oct. 15, 2020   On Zenia Resides was last seen in March, 2019.  He has a history of paroxysmal atrial fibrillation.  He has had a treadmill test which did not show any ischemia.  He recently had a coronary calcium score of 2546 with extensive three-vessel coronary artery calcification.  He is in the 92nd percentile for age/sex matched controls..  No symptoms of chest pain or dysnea. He has PAF. Was on atenolol for years and had been having "gray out periods"   No further presyncopal epidoses since stopping the atenolol .  Has a hx of CKD but creatinine has improved.  Used to exercise quite a bit,  Limited by covid.   Is staying more active now.  Going hiking .  [place] last weekend.   Going to [place]. [place] tomorrow.    He is never had any episodes of angina.  He has known chronic kidney disease.  He is had slight improvement of his renal function over the past year.  April 26, 2019:  Antony Haste is seen  today for follow-up visit.  He has a history of paroxysmal atrial fibrillation.  He has a very high coronary calcium score of 2546 which places him in the 92nd percentile for age/gender matched controls.   Has chronic nephritis - followed by Dr. Jimmy Footman.   Has had a few CP . Has PAF .   Might last 15 min - all day   We discussed sending him to the Afib clinc if his AF is getting worse . Seems to have PAF every 2-3 weeks.      We discussed options for antiarrhythmics.  He would not be a candidate for flecainide because of his presence of coronary artery disease.  He might be a good candidate for Tikosyn or perhaps amiodarone.  He is having worsening episodes of paroxysmal atrial fibrillation.  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  We found long episodes of heart block during an event monitor in  2018.  Was on atenolol 100 mg at that time. It is now apparent that even small doses of propranolol are causing similar episodes of heart block.  I worry that we will not be able to treat him with any antiarrhythmic medications and see we have pacemaker in place.  I would like to refer him to our electrophysiology colleagues to get an opinion.  Is on repatha ( managed by Dr. Joylene Draft)   Total chol is 76 HDL = 31 LDL = 21 Trigs = 118 . Creatinine is 1.5    He is having worsening .  Past Medical History:  Diagnosis Date  . Anal fissure    ?  . Bright red rectal bleeding   . C2 cervical fracture ([place]) 06/24/2014  . Chronic renal failure   . Diverticulosis   . GERD (gastroesophageal reflux disease)   . H/O prostate cancer 06/05/2011   S/p radiation rx 2010   . Hemorrhage of rectum and anus 05/09/2011  . HLD (hyperlipidemia)   . HTN (hypertension)   . Kidney disease    acute glomeral nepritis  . Paroxysmal atrial fibrillation (HCC)   . Peripheral vascular disease ([place])    carotid artery stent ( right  )  . Prostate cancer ([place])   . 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 Sinus bradycardia     Past Surgical History:  Procedure Laterality Date  . CAROTID STENT    . ESOPHAGOGASTRODUODENOSCOPY (EGD) WITH PROPOFOL N/A 01/05/2018   Procedure: ESOPHAGOGASTRODUODENOSCOPY (EGD) WITH PROPOFOL;  Surgeon: [name], MD;  Location: [facility] ENDOSCOPY;  Service: Endoscopy;  Laterality: N/A;  . external beam radiation to prostate  2009  . FLEXIBLE SIGMOIDOSCOPY  05/22/2011   Procedure: FLEXIBLE SIGMOIDOSCOPY;  Surgeon: [name], MD;  Location: [facility] ENDOSCOPY;  Service: Endoscopy;  Laterality: N/A;  . HOT HEMOSTASIS  05/22/2011   Procedure: HOT HEMOSTASIS (ARGON PLASMA COAGULATION/BICAP);  Surgeon: [name], MD;  Location: [facility];  Service: Endoscopy;  Laterality: N/A;  . RADIOLOGY WITH ANESTHESIA N/A 01/04/2018   Procedure: MRI WITH ANESTHESIA/LUMBAR SPINE WITHOUT CONTRAST;   Surgeon: Radiologist, Medication, MD;  Location: [facility];  Service: Radiology;  Laterality: N/A;    Current Medications: Current Meds  Medication Sig  . allopurinol (ZYLOPRIM) 300 MG tablet Take 300 mg by mouth daily.   Marland Kitchen amLODipine (NORVASC) 10 MG tablet Take 10 mg by mouth daily.  Marland Kitchen apixaban (ELIQUIS) 5 MG TABS tablet Take 10 mg by mouth 2 (two) times daily.  Marland Kitchen ezetimibe (ZETIA) 10 MG tablet Take 10 mg by  mouth daily.  . famotidine (PEPCID) 20 MG tablet Take 20 mg by mouth 2 (two) times daily.  . furosemide (LASIX) 20 MG tablet Take 20 mg by mouth daily.  . nitroGLYCERIN (NITROSTAT) 0.4 MG SL tablet Place 1 tablet (0.4 mg total) under the tongue every 5 (five) minutes as needed for chest pain.  Marland Kitchen telmisartan (MICARDIS) 80 MG tablet Take 1 tablet by mouth daily.     Allergies:   Crestor [rosuvastatin calcium], Contrast media [iodinated diagnostic agents], and Lipitor [atorvastatin]   Social History   Socioeconomic History  . Marital status: Married    Spouse name: Not on file  . Number of children: 1  . Years of education: Not on file  . Highest education level: Not on file  Occupational History  . Occupation: pharmacist  Tobacco Use  . Smoking status: Former Smoker    Types: Cigarettes  . Smokeless tobacco: Never Used  Substance and Sexual Activity  . Alcohol use: Yes    Comment: occas  . Drug use: No  . Sexual activity: Yes  Other Topics Concern  . Not on file  Social History Narrative  . Not on file   Social Determinants of Health   Financial Resource Strain:   . Difficulty of Paying Living Expenses: Not on file  Food Insecurity:   . Worried About Charity fundraiser in the Last Year: Not on file  . Ran Out of Food in the Last Year: Not on file  Transportation Needs:   . Lack of Transportation (Medical): Not on file  . Lack of Transportation (Non-Medical): Not on file  Physical Activity:   . Days of Exercise per Week: Not on file  . Minutes of Exercise per  Session: Not on file  Stress:   . Feeling of Stress : Not on file  Social Connections:   . Frequency of Communication with Friends and Family: Not on file  . Frequency of Social Gatherings with Friends and Family: Not on file  . Attends Religious Services: Not on file  . Active Member of Clubs or Organizations: Not on file  . Attends Archivist Meetings: Not on file  . Marital Status: Not on file     Family History: The patient's family history includes Atrial fibrillation in his brother; Brain cancer in his brother. There is no history of Colon cancer. ROS:   Please see the history of present illness.     All other systems reviewed and are negative.  EKGs/Labs/Other Studies Reviewed:    The following studies were reviewed today: Records and labs from Dr. Joylene Draft  EKG:   December 09, 2018: Sinus bradycardia 59 beats minute.  Occasional premature ventricular contractions.  Nonspecific ST and T wave changes.  Recent Labs: No results found for requested labs within last 8760 hours.  Recent Lipid Panel No results found for: CHOL, TRIG, HDL, CHOLHDL, VLDL, LDLCALC, LDLDIRECT    Physical Exam: Blood pressure (!) 128/46, pulse (!) 50, height 5\' 9"  (1.753 m), weight 158 lb (71.7 kg), SpO2 98 %.  GEN:  Well nourished, well developed in no acute distress HEENT: Normal NECK: No JVD; No carotid bruits LYMPHATICS: No lymphadenopathy CARDIAC: RRR , no murmurs, rubs, gallops RESPIRATORY:  Clear to auscultation without rales, wheezing or rhonchi  ABDOMEN: Soft, non-tender, non-distended MUSCULOSKELETAL:  No edema; No deformity  SKIN: Warm and dry NEUROLOGIC:  Alert and oriented x 3    ASSESSMENT:    No diagnosis found. PLAN:  In order of problems listed above:  1.  Paroxysmal Atrial fib: /Sick sinus syndrome    he is having worsening episodes of paroxysmal atrial fibrillation.  He took a propranolol which resulted in profound heart block [place]..  We found long  episodes of heart block during an event monitor in 2018.  Was on atenolol 100 mg at that time. It is now apparent that even small doses of propranolol are causing similar episodes of heart block.  I worry that we will not be able to treat him with any antiarrhythmic medications and see we have pacemaker in place.  I would like to refer him to our electrophysiology colleagues to get an opinion.  Following that we can decide whether or not he needs an antiarrhythmic but until we get these episodes of heart block and pauses resolved, I do not think that it would be safe to start him on an antiarrhythmic or any AV node blockers.  2.  Coronary artery disease: Antony Haste has an extremely high coronary calcium score.  The calcium distribution is is in all 3 coronary arteries. He is currently on Repatha.  His LDL is in the low 20s.  Continue current medications. He is not having any episodes of angina.  We did a Myoview study several years ago which was low risk.  It is possible that this was balanced ischemia.  At this point his creatinine is 1.5 but it has been higher.  He follows with nephrology.  We have not elected to proceed with heart catheterization because of the risk of renal failure.  He is very active and healthy and does not want to risk renal failure at this time.  He will continue to keep [place] informed as he has any symptoms.    3.  Hyperlipidemia:  Doing well on Repatha.  This office visit required a high level of clinical decision-making.  We will see him in 6 months for a follow-up office visit.   Medication Adjustments/Labs and Tests Ordered: Current medicines are reviewed at length with the patient today.  Concerns regarding medicines are outlined above.  No orders of the defined types were placed in this encounter.  No orders of the defined types were placed in this encounter.   Signed, Mertie Moores, MD  04/26/2019 3:44 PM    [place]

## 2019-04-27 ENCOUNTER — Telehealth: Payer: Self-pay | Admitting: Internal Medicine

## 2019-04-27 NOTE — Telephone Encounter (Signed)
New message    Timothy [name] is being referred by Dr. Acie Fredrickson to EP. Timothy [name] said that Dr. Lovena Le was recommended and they would like to see him. Timothy [name] is a patient of Dr. Rayann Heman, last seen in 2019.   Is it ok for Timothy [name] to see Dr. Lovena Le.

## 2019-05-01 NOTE — Telephone Encounter (Signed)
I am ok with this.

## 2019-05-10 ENCOUNTER — Ambulatory Visit (INDEPENDENT_AMBULATORY_CARE_PROVIDER_SITE_OTHER): Payer: Medicare Other | Admitting: Internal Medicine

## 2019-05-10 ENCOUNTER — Other Ambulatory Visit: Payer: Self-pay

## 2019-05-10 ENCOUNTER — Encounter: Payer: Self-pay | Admitting: Internal Medicine

## 2019-05-10 VITALS — BP 175/58 | HR 68 | Ht 69.0 in | Wt 158.8 lb

## 2019-05-10 DIAGNOSIS — I480 Paroxysmal atrial fibrillation: Secondary | ICD-10-CM

## 2019-05-10 DIAGNOSIS — R001 Bradycardia, unspecified: Secondary | ICD-10-CM

## 2019-05-10 NOTE — Progress Notes (Signed)
HPI [name] is referred by Dr. Acie Fredrickson for evaluation of SSS and PAF. He is a retired Software engineer with a h/o dyslipidemia, CAD, who has had palpitations in the past and has had documented atrial fib on cardiac monitoring. The patient also has HTN which is usually well controlled. He feels palpitations at night time but these haven been fairly mild by his report. He was on fairly high dose atenolol and developed symptomatic heart block. He has stage 4 chronic kidney disease. Overall he gives the impression that his quality of life remains pretty good.  Allergies  Allergen Reactions  . Crestor [Rosuvastatin Calcium] Other (See Comments)    Muscle aches  . Contrast Media [Iodinated Diagnostic Agents]     esi x2 with no reaction 12/2017 and 01/2018  . Lipitor [Atorvastatin]      Current Outpatient Medications  Medication Sig Dispense Refill  . allopurinol (ZYLOPRIM) 300 MG tablet Take 300 mg by mouth daily.     Marland Kitchen amLODipine (NORVASC) 10 MG tablet Take 10 mg by mouth daily.    Marland Kitchen apixaban (ELIQUIS) 5 MG TABS tablet Take 10 mg by mouth 2 (two) times daily.    Marland Kitchen ezetimibe (ZETIA) 10 MG tablet Take 10 mg by mouth daily.    . famotidine (PEPCID) 20 MG tablet Take 20 mg by mouth 2 (two) times daily.    . furosemide (LASIX) 20 MG tablet Take 20 mg by mouth daily.    . nitroGLYCERIN (NITROSTAT) 0.4 MG SL tablet Place 1 tablet (0.4 mg total) under the tongue every 5 (five) minutes as needed for chest pain. 25 tablet 6  . telmisartan (MICARDIS) 80 MG tablet Take 1 tablet by mouth daily.     No current facility-administered medications for this visit.     Past Medical History:  Diagnosis Date  . Anal fissure    ?  . Bright red rectal bleeding   . C2 cervical fracture ([place]) 06/24/2014  . Chronic renal failure   . Diverticulosis   . GERD (gastroesophageal reflux disease)   . H/O prostate cancer 06/05/2011   S/p radiation rx 2010   . Hemorrhage of rectum and anus 05/09/2011  . HLD  (hyperlipidemia)   . HTN (hypertension)   . Kidney disease    acute glomeral nepritis  . Paroxysmal atrial fibrillation (HCC)   . Peripheral vascular disease ([place])    carotid artery stent ( right  )  . Prostate cancer ([place])   . 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 Sinus bradycardia     ROS:   All systems reviewed and negative except as noted in the HPI.   Past Surgical History:  Procedure Laterality Date  . CAROTID STENT    . ESOPHAGOGASTRODUODENOSCOPY (EGD) WITH PROPOFOL N/A 01/05/2018   Procedure: ESOPHAGOGASTRODUODENOSCOPY (EGD) WITH PROPOFOL;  Surgeon: [name], MD;  [facility] ENDOSCOPY;  Service: Endoscopy;  Laterality: N/A;  . external beam radiation to prostate  2009  . FLEXIBLE SIGMOIDOSCOPY  05/22/2011   Procedure: FLEXIBLE SIGMOIDOSCOPY;  Surgeon: [name], MD;  Location: [facility] ENDOSCOPY;  Service: Endoscopy;  Laterality: N/A;  . HOT HEMOSTASIS  05/22/2011   Procedure: HOT HEMOSTASIS (ARGON PLASMA COAGULATION/BICAP);  Surgeon: [name], MD;  Location: [facility];  Service: Endoscopy;  Laterality: N/A;  . RADIOLOGY WITH ANESTHESIA N/A 01/04/2018   Procedure: MRI WITH ANESTHESIA/LUMBAR SPINE WITHOUT CONTRAST;  Surgeon: Radiologist, Medication, MD;  Location: [place];  Service: Radiology;  Laterality: N/A;  Family History  Problem Relation Age of Onset  . Brain cancer Brother   . Atrial fibrillation Brother   . Colon cancer Neg Hx      Social History   Socioeconomic History  . Marital status: Married    Spouse name: Not on file  . Number of children: 1  . Years of education: Not on file  . Highest education level: Not on file  Occupational History  . Occupation: pharmacist  Tobacco Use  . Smoking status: Former Smoker    Types: Cigarettes  . Smokeless tobacco: Never Used  Substance and Sexual Activity  . Alcohol use: Yes    Comment: occas  . Drug use: No  . Sexual activity: Yes  Other Topics Concern  . Not on file  Social  History Narrative  . Not on file   Social Determinants of Health   Financial Resource Strain:   . Difficulty of Paying Living Expenses:   Food Insecurity:   . Worried About Charity fundraiser in the Last Year:   . Arboriculturist in the Last Year:   Transportation Needs:   . Film/video editor (Medical):   Marland Kitchen Lack of Transportation (Non-Medical):   Physical Activity:   . Days of Exercise per Week:   . Minutes of Exercise per Session:   Stress:   . Feeling of Stress :   Social Connections:   . Frequency of Communication with Friends and Family:   . Frequency of Social Gatherings with Friends and Family:   . Attends Religious Services:   . Active Member of Clubs or Organizations:   . Attends Archivist Meetings:   Marland Kitchen Marital Status:   Intimate Partner Violence:   . Fear of Current or Ex-Partner:   . Emotionally Abused:   Marland Kitchen Physically Abused:   . Sexually Abused:      BP (!) 175/58   Pulse 68   Ht 5\' 9"  (1.753 m)   Wt 158 lb 12.8 oz (72 kg)   SpO2 97%   BMI 23.45 kg/m   Physical Exam:  Well appearing NAD HEENT: Unremarkable Neck:  No JVD, no thyromegally Lymphatics:  No adenopathy Back:  No CVA tenderness Lungs:  Clear with no whezes HEART:  Regular rate rhythm, no murmurs, no rubs, no clicks Abd:  soft, positive bowel sounds, no organomegally, no rebound, no guarding Ext:  2 plus pulses, no edema, no cyanosis, no clubbing Skin:  No rashes no nodules Neuro:  CN II through XII intact, motor grossly intact  EKG - - NSR with frequent PAC's and occasional PVC's.  Assess/Plan: 1. PAF - he is minimally symptomatic and appears to be tolerating his anti-coagulation. 2. PAC's/PVC's - he does have some symptoms and does have a significant number of these. Central question is what if any medication options he has in light of his comorbidities. Amiodarone is really his only option. At this point the risk/benefit ratio really does not exist to recommend  amiodarone. I have asked him to undergo watchful waiting. If his arrhythmias symptoms worsen then amiodarone and most likely PPM insertion will be required.  3. Heart block - he has had this on atenolol which has been stopped. He was symptomatic but is now essentially asymptomatic.  4. HTN - his bp was high in my office today but was well controlled when he saw Dr. Acie Fredrickson and has been ok at home.   Mikle Bosworth.D.

## 2019-05-10 NOTE — Patient Instructions (Addendum)
Medication Instructions:  Your physician recommends that you continue on your current medications as directed. Please refer to the Current Medication list given to you today.  Labwork: None ordered.  Testing/Procedures: None ordered.  Follow-Up: Your physician wants you to follow-up in: as needed with Dr. Taylor.      Any Other Special Instructions Will Be Listed Below (If Applicable).  If you need a refill on your cardiac medications before your next appointment, please call your pharmacy.   

## 2019-07-13 DIAGNOSIS — Z7901 Long term (current) use of anticoagulants: Secondary | ICD-10-CM | POA: Diagnosis not present

## 2019-07-13 DIAGNOSIS — K922 Gastrointestinal hemorrhage, unspecified: Secondary | ICD-10-CM | POA: Diagnosis not present

## 2019-07-13 DIAGNOSIS — M109 Gout, unspecified: Secondary | ICD-10-CM | POA: Diagnosis not present

## 2019-07-13 DIAGNOSIS — I4891 Unspecified atrial fibrillation: Secondary | ICD-10-CM | POA: Diagnosis not present

## 2019-07-13 DIAGNOSIS — N184 Chronic kidney disease, stage 4 (severe): Secondary | ICD-10-CM | POA: Diagnosis not present

## 2019-07-13 DIAGNOSIS — D631 Anemia in chronic kidney disease: Secondary | ICD-10-CM | POA: Diagnosis not present

## 2019-07-13 DIAGNOSIS — I129 Hypertensive chronic kidney disease with stage 1 through stage 4 chronic kidney disease, or unspecified chronic kidney disease: Secondary | ICD-10-CM | POA: Diagnosis not present

## 2019-07-13 DIAGNOSIS — I779 Disorder of arteries and arterioles, unspecified: Secondary | ICD-10-CM | POA: Diagnosis not present

## 2019-07-13 DIAGNOSIS — E785 Hyperlipidemia, unspecified: Secondary | ICD-10-CM | POA: Diagnosis not present

## 2019-07-13 DIAGNOSIS — K219 Gastro-esophageal reflux disease without esophagitis: Secondary | ICD-10-CM | POA: Diagnosis not present

## 2019-07-13 DIAGNOSIS — C61 Malignant neoplasm of prostate: Secondary | ICD-10-CM | POA: Diagnosis not present

## 2019-07-13 DIAGNOSIS — N2581 Secondary hyperparathyroidism of renal origin: Secondary | ICD-10-CM | POA: Diagnosis not present

## 2019-10-13 DIAGNOSIS — Z23 Encounter for immunization: Secondary | ICD-10-CM | POA: Diagnosis not present

## 2019-10-18 IMAGING — US US HEPATIC LIVER DOPPLER
1 series · 13 of 25 positions shown · non-contrast
Comparison: None.

CLINICAL DATA: 77-year-old male with a bleeding gastric polyp.
Evaluate for evidence of portal hypertension.

EXAM:
DUPLEX ULTRASOUND OF LIVER
TECHNIQUE: Color and duplex Doppler ultrasound was performed to evaluate the
hepatic in-flow and out-flow vessels.

[Series 1: us hepatic liver doppler · 0.25mm/px · 13 of 83 slices shown]
[im 1/83]
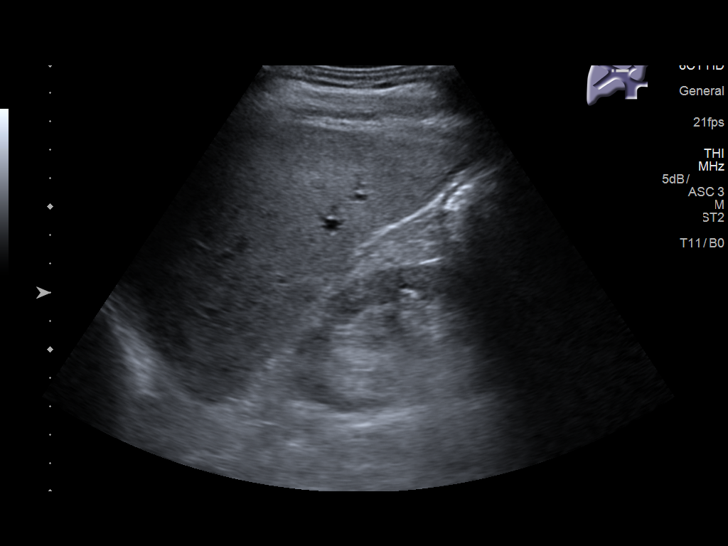
[im 7/83]
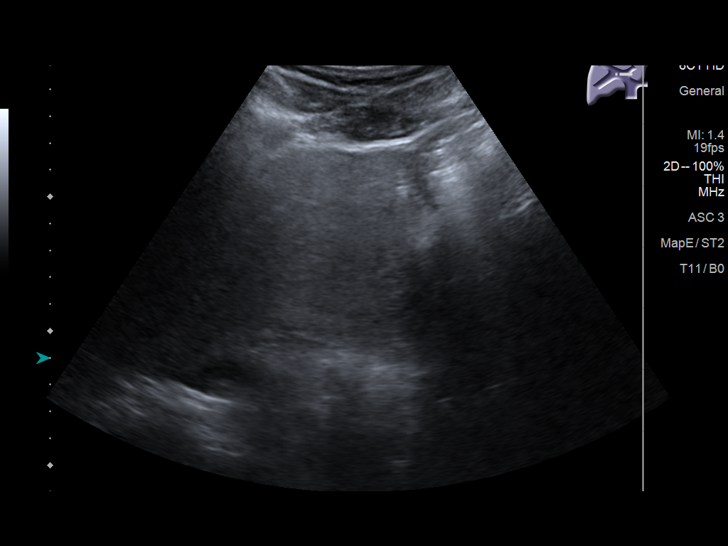
[im 14/83]
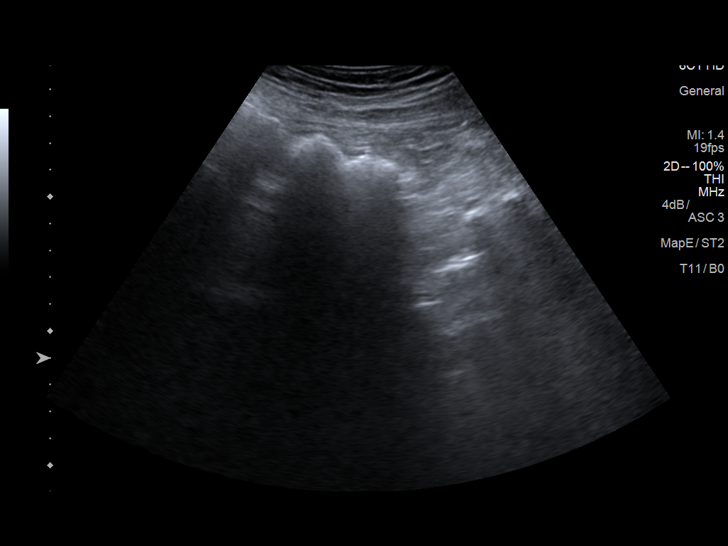
[im 21/83]
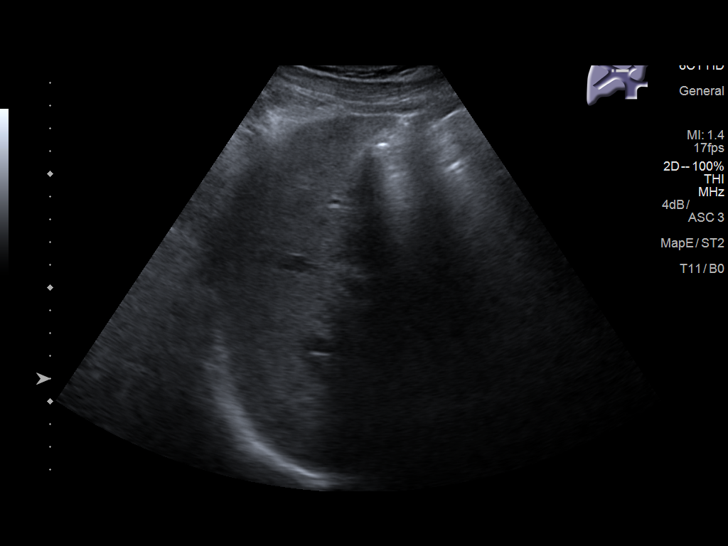
[im 28/83]
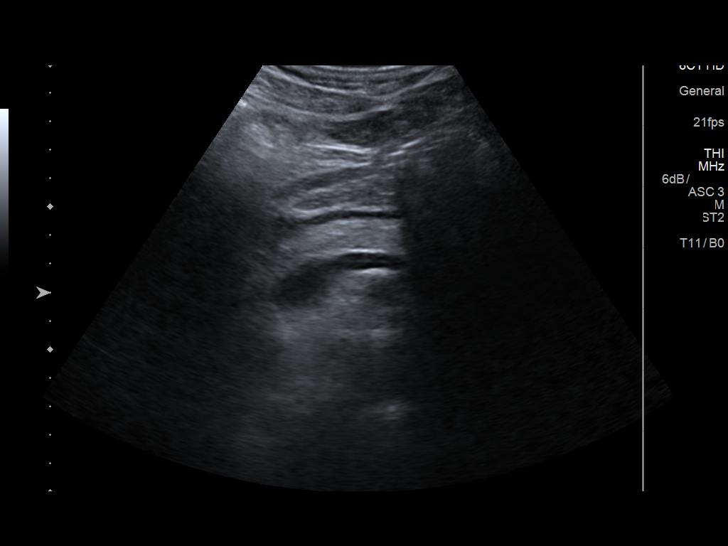
[im 35/83]
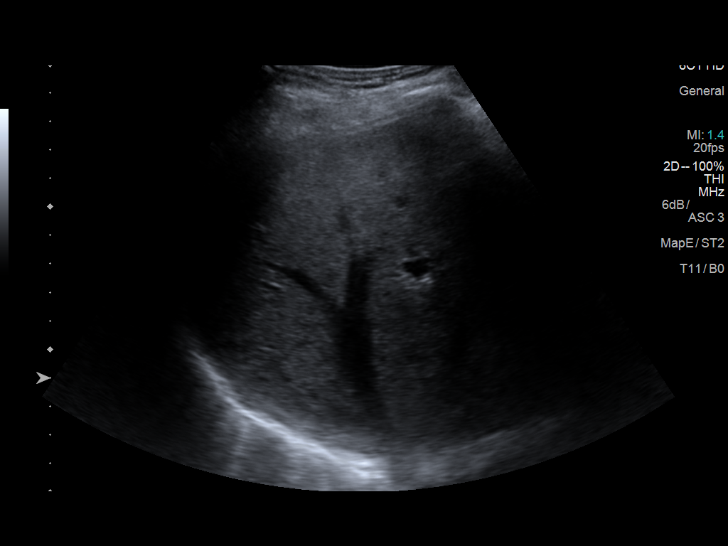
[im 42/83]
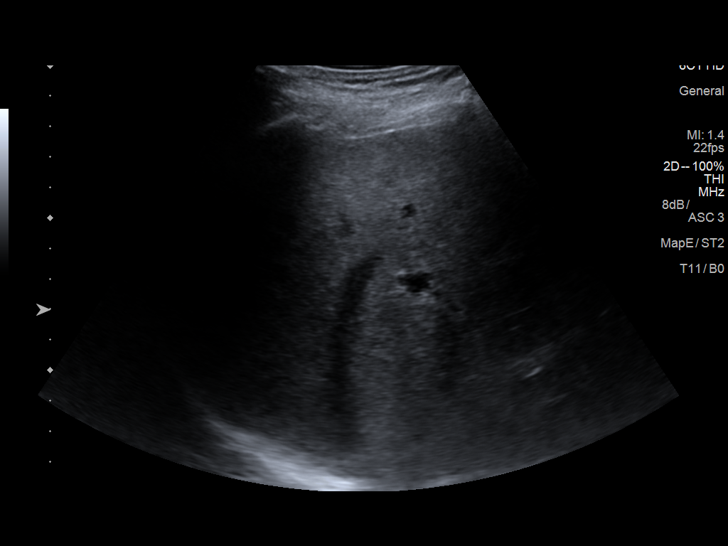
[im 48/83]
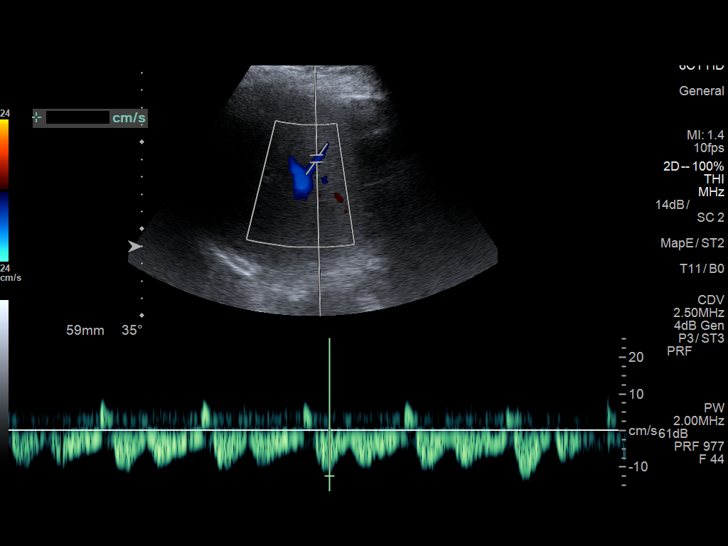
[im 55/83]
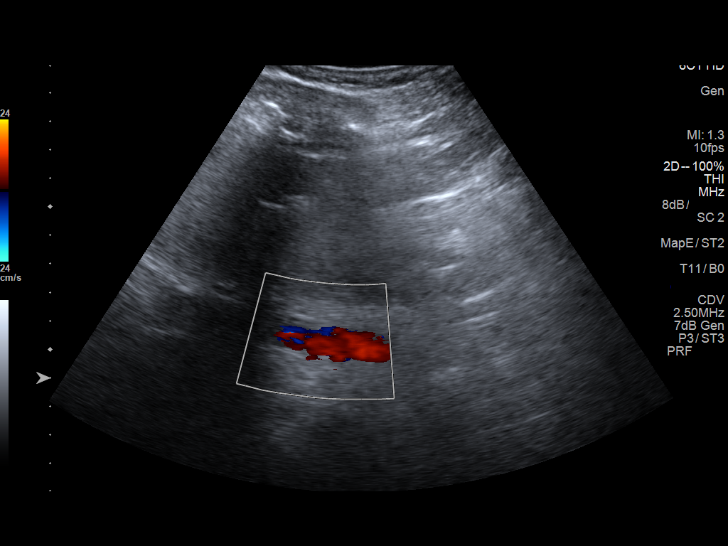
[im 62/83]
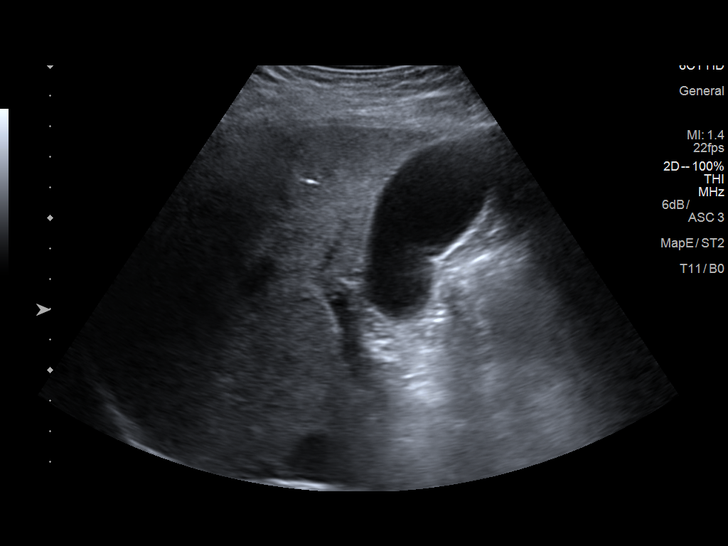
[im 69/83]
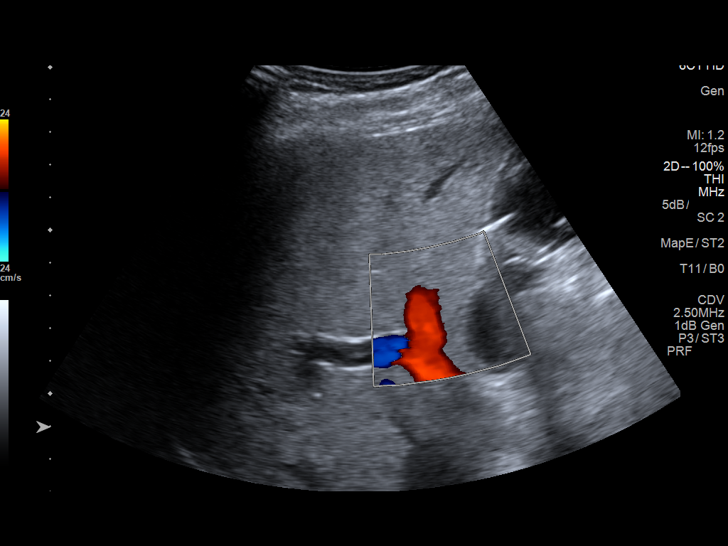
[im 76/83]
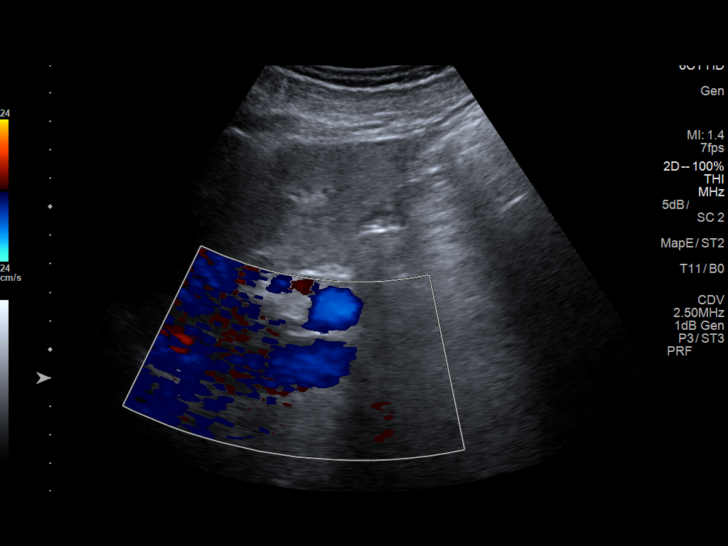
[im 83/83]
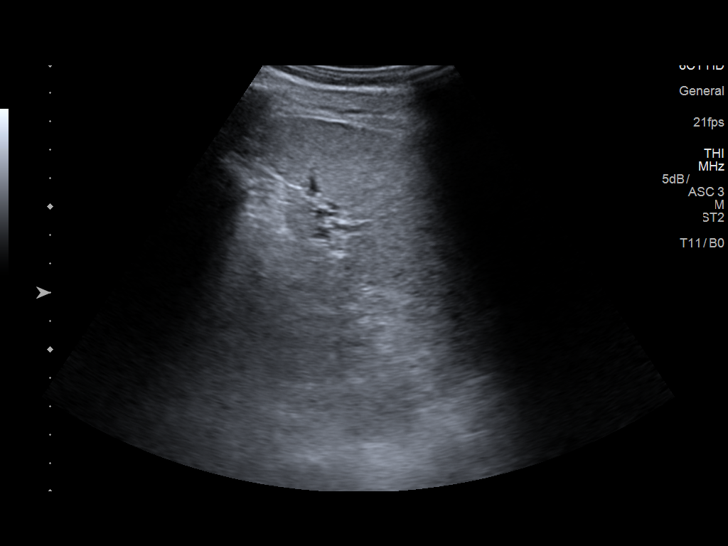

[13 of 25 positions shown; findings below may reference images not displayed]

FINDINGS: Liver: Mildly echogenic hepatic parenchyma with coarsening of the
echotexture. No discrete hepatic lesion is identified. Normal
hepatic contour without nodularity.

No focal lesion, mass or intrahepatic biliary ductal dilatation.

Portal Vein Velocities

Main:  38-65 cm/sec

Right:  31 cm/sec

Left:  22 cm/sec

Hepatic Vein Velocities

Right:  24 cm/sec

Middle:  25 cm/sec

Left:  17 cm/sec

IVC: Present and patent with normal respiratory phasicity.

Hepatic Artery Velocity:  178 cm/sec

Splenic Vein Velocity:  46 cm/sec

Varices: None visualized

Ascites: None

Spleen: Normal splenic size.  No discrete lesion.

Other: Incidentally imaged renal parenchyma appears echogenic
suggesting underlying medical renal disease.
IMPRESSION: 1. Hepatic steatosis.
2. Widely patent portal veins without ultrasound evidence of portal
hypertension.
3. Echogenic renal parenchyma suggests underlying medical renal
disease.

## 2019-11-16 DIAGNOSIS — Z23 Encounter for immunization: Secondary | ICD-10-CM | POA: Diagnosis not present

## 2019-11-16 DIAGNOSIS — N2581 Secondary hyperparathyroidism of renal origin: Secondary | ICD-10-CM | POA: Diagnosis not present

## 2019-11-16 DIAGNOSIS — I4891 Unspecified atrial fibrillation: Secondary | ICD-10-CM | POA: Diagnosis not present

## 2019-11-16 DIAGNOSIS — I779 Disorder of arteries and arterioles, unspecified: Secondary | ICD-10-CM | POA: Diagnosis not present

## 2019-11-16 DIAGNOSIS — C61 Malignant neoplasm of prostate: Secondary | ICD-10-CM | POA: Diagnosis not present

## 2019-11-16 DIAGNOSIS — D631 Anemia in chronic kidney disease: Secondary | ICD-10-CM | POA: Diagnosis not present

## 2019-11-16 DIAGNOSIS — E785 Hyperlipidemia, unspecified: Secondary | ICD-10-CM | POA: Diagnosis not present

## 2019-11-16 DIAGNOSIS — N184 Chronic kidney disease, stage 4 (severe): Secondary | ICD-10-CM | POA: Diagnosis not present

## 2019-11-16 DIAGNOSIS — Z7901 Long term (current) use of anticoagulants: Secondary | ICD-10-CM | POA: Diagnosis not present

## 2019-11-16 DIAGNOSIS — N189 Chronic kidney disease, unspecified: Secondary | ICD-10-CM | POA: Diagnosis not present

## 2019-11-16 DIAGNOSIS — K219 Gastro-esophageal reflux disease without esophagitis: Secondary | ICD-10-CM | POA: Diagnosis not present

## 2019-11-16 DIAGNOSIS — I129 Hypertensive chronic kidney disease with stage 1 through stage 4 chronic kidney disease, or unspecified chronic kidney disease: Secondary | ICD-10-CM | POA: Diagnosis not present

## 2019-11-16 DIAGNOSIS — M109 Gout, unspecified: Secondary | ICD-10-CM | POA: Diagnosis not present

## 2019-11-22 DIAGNOSIS — L603 Nail dystrophy: Secondary | ICD-10-CM | POA: Diagnosis not present

## 2019-11-22 DIAGNOSIS — D485 Neoplasm of uncertain behavior of skin: Secondary | ICD-10-CM | POA: Diagnosis not present

## 2019-11-22 DIAGNOSIS — L570 Actinic keratosis: Secondary | ICD-10-CM | POA: Diagnosis not present

## 2019-11-22 DIAGNOSIS — D692 Other nonthrombocytopenic purpura: Secondary | ICD-10-CM | POA: Diagnosis not present

## 2019-11-22 DIAGNOSIS — Z85828 Personal history of other malignant neoplasm of skin: Secondary | ICD-10-CM | POA: Diagnosis not present

## 2019-11-22 DIAGNOSIS — L821 Other seborrheic keratosis: Secondary | ICD-10-CM | POA: Diagnosis not present

## 2019-11-22 DIAGNOSIS — D225 Melanocytic nevi of trunk: Secondary | ICD-10-CM | POA: Diagnosis not present

## 2019-12-12 DIAGNOSIS — Z125 Encounter for screening for malignant neoplasm of prostate: Secondary | ICD-10-CM | POA: Diagnosis not present

## 2019-12-12 DIAGNOSIS — M109 Gout, unspecified: Secondary | ICD-10-CM | POA: Diagnosis not present

## 2019-12-12 DIAGNOSIS — E785 Hyperlipidemia, unspecified: Secondary | ICD-10-CM | POA: Diagnosis not present

## 2019-12-12 DIAGNOSIS — R7301 Impaired fasting glucose: Secondary | ICD-10-CM | POA: Diagnosis not present

## 2019-12-14 DIAGNOSIS — I739 Peripheral vascular disease, unspecified: Secondary | ICD-10-CM | POA: Diagnosis not present

## 2019-12-14 DIAGNOSIS — D696 Thrombocytopenia, unspecified: Secondary | ICD-10-CM | POA: Diagnosis not present

## 2019-12-14 DIAGNOSIS — Z7901 Long term (current) use of anticoagulants: Secondary | ICD-10-CM | POA: Diagnosis not present

## 2019-12-14 DIAGNOSIS — C61 Malignant neoplasm of prostate: Secondary | ICD-10-CM | POA: Diagnosis not present

## 2019-12-14 DIAGNOSIS — N184 Chronic kidney disease, stage 4 (severe): Secondary | ICD-10-CM | POA: Diagnosis not present

## 2019-12-14 DIAGNOSIS — R82998 Other abnormal findings in urine: Secondary | ICD-10-CM | POA: Diagnosis not present

## 2019-12-14 DIAGNOSIS — Z1331 Encounter for screening for depression: Secondary | ICD-10-CM | POA: Diagnosis not present

## 2019-12-14 DIAGNOSIS — E785 Hyperlipidemia, unspecified: Secondary | ICD-10-CM | POA: Diagnosis not present

## 2019-12-14 DIAGNOSIS — Z95828 Presence of other vascular implants and grafts: Secondary | ICD-10-CM | POA: Diagnosis not present

## 2019-12-14 DIAGNOSIS — I480 Paroxysmal atrial fibrillation: Secondary | ICD-10-CM | POA: Diagnosis not present

## 2019-12-14 DIAGNOSIS — I10 Essential (primary) hypertension: Secondary | ICD-10-CM | POA: Diagnosis not present

## 2019-12-14 DIAGNOSIS — Z0000 Encounter for general adult medical examination without abnormal findings: Secondary | ICD-10-CM | POA: Diagnosis not present

## 2019-12-14 DIAGNOSIS — K2270 Barrett's esophagus without dysplasia: Secondary | ICD-10-CM | POA: Diagnosis not present

## 2019-12-15 DIAGNOSIS — Z1212 Encounter for screening for malignant neoplasm of rectum: Secondary | ICD-10-CM | POA: Diagnosis not present

## 2020-04-04 DIAGNOSIS — Z23 Encounter for immunization: Secondary | ICD-10-CM | POA: Diagnosis not present

## 2020-04-17 DIAGNOSIS — K922 Gastrointestinal hemorrhage, unspecified: Secondary | ICD-10-CM | POA: Diagnosis not present

## 2020-04-17 DIAGNOSIS — I129 Hypertensive chronic kidney disease with stage 1 through stage 4 chronic kidney disease, or unspecified chronic kidney disease: Secondary | ICD-10-CM | POA: Diagnosis not present

## 2020-04-17 DIAGNOSIS — I779 Disorder of arteries and arterioles, unspecified: Secondary | ICD-10-CM | POA: Diagnosis not present

## 2020-04-17 DIAGNOSIS — N2581 Secondary hyperparathyroidism of renal origin: Secondary | ICD-10-CM | POA: Diagnosis not present

## 2020-04-17 DIAGNOSIS — E785 Hyperlipidemia, unspecified: Secondary | ICD-10-CM | POA: Diagnosis not present

## 2020-04-17 DIAGNOSIS — C61 Malignant neoplasm of prostate: Secondary | ICD-10-CM | POA: Diagnosis not present

## 2020-04-17 DIAGNOSIS — M109 Gout, unspecified: Secondary | ICD-10-CM | POA: Diagnosis not present

## 2020-04-17 DIAGNOSIS — I4891 Unspecified atrial fibrillation: Secondary | ICD-10-CM | POA: Diagnosis not present

## 2020-04-17 DIAGNOSIS — K219 Gastro-esophageal reflux disease without esophagitis: Secondary | ICD-10-CM | POA: Diagnosis not present

## 2020-04-17 DIAGNOSIS — N184 Chronic kidney disease, stage 4 (severe): Secondary | ICD-10-CM | POA: Diagnosis not present

## 2020-04-17 DIAGNOSIS — D631 Anemia in chronic kidney disease: Secondary | ICD-10-CM | POA: Diagnosis not present

## 2020-04-17 DIAGNOSIS — Z7901 Long term (current) use of anticoagulants: Secondary | ICD-10-CM | POA: Diagnosis not present

## 2020-04-17 DIAGNOSIS — N189 Chronic kidney disease, unspecified: Secondary | ICD-10-CM | POA: Diagnosis not present

## 2020-05-09 DIAGNOSIS — H00014 Hordeolum externum left upper eyelid: Secondary | ICD-10-CM | POA: Diagnosis not present

## 2020-05-16 DIAGNOSIS — C61 Malignant neoplasm of prostate: Secondary | ICD-10-CM | POA: Diagnosis not present

## 2020-05-16 DIAGNOSIS — R9721 Rising PSA following treatment for malignant neoplasm of prostate: Secondary | ICD-10-CM | POA: Diagnosis not present

## 2020-05-17 ENCOUNTER — Other Ambulatory Visit (HOSPITAL_COMMUNITY): Payer: Self-pay | Admitting: Urology

## 2020-05-17 DIAGNOSIS — R9721 Rising PSA following treatment for malignant neoplasm of prostate: Secondary | ICD-10-CM

## 2020-05-17 DIAGNOSIS — C61 Malignant neoplasm of prostate: Secondary | ICD-10-CM

## 2020-05-21 ENCOUNTER — Encounter: Payer: Self-pay | Admitting: Cardiovascular Disease

## 2020-05-21 ENCOUNTER — Ambulatory Visit (INDEPENDENT_AMBULATORY_CARE_PROVIDER_SITE_OTHER): Payer: Medicare Other | Admitting: Cardiovascular Disease

## 2020-05-21 ENCOUNTER — Other Ambulatory Visit: Payer: Self-pay

## 2020-05-21 VITALS — BP 124/64 | HR 85 | Ht 69.0 in | Wt 162.6 lb

## 2020-05-21 DIAGNOSIS — I480 Paroxysmal atrial fibrillation: Secondary | ICD-10-CM | POA: Diagnosis not present

## 2020-05-21 DIAGNOSIS — I2510 Atherosclerotic heart disease of native coronary artery without angina pectoris: Secondary | ICD-10-CM

## 2020-05-21 NOTE — Progress Notes (Signed)
Cardiology Office Note:    Date:  05/21/2020   ID:  Timothy, [name] October 07, 1940, MRN [id number]  PCP:  Crist Infante, MD  Cardiologist:  Mertie Moores, MD    Referring MD: Crist Infante, MD   Problem list 1. Paroxysmal Atrial fib - with post conversion pauses CHADS2VASC  =  5  ( age 80 , HTN, CAD, TIA  2. History of premature ventricular contractions 3.  HTN 4. Hyperlipidemia 5. Carotid artery disease-status post stenting around 2004 6.  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 ( Creatinine = 2.0 July 2018)  7.  Hx of TIA - 10-12 years ago   Chief Complaint  Patient presents with  . Atrial Fibrillation    Previous Notes - Sept. 2018    Timothy [name] is a 80 y.o. male with a hx of PVCs and PACs. Presents now with increase palpitations.  Has been diagnosied with PVCs and PACs in the past   Now, these palpitations are more pronounced.   Has a hx of TIAs in the past  Has classical migraine HA The palpitations last for a few seconds.   Not very severe ,  Is active, does not do any regular organized exercise.  Is able to do his chores and yard work without any issues  Has been on Atenolol since age 60 for palpitations   Oct. 5, 2018:  [place] is seen with wife, Timothy [name].  Still having some palppitations , last for seconds.  No dizziness, no CP or dyspnea.   30 day monitor showed atrial fib and also show some post conversion pauses.  We have gradually decreased his dose of Atenolol   Echo shows normal LV function with grade 1 diastolic dysfunction , mild TR with mild pulmonary HTN  April 24, 2017: Seen wife , Timothy [name] today   Timothy [name] is seen today for follow-up visit.  He has paroxysmal atrial fibrillation.  He had a treadmill test which showed good chronotropic response but he did have some ST changes.  Follow-up stress Myoview study showed no ST segment changes and showed no evidence of ischemia.  Has some lightheadedness.  No syncope.   Has CKD - Creatine up to 2.8  ( sees  Deterding )  Aldactone  Has been stopped , micarditis has been continued.  Has been exercising a little.  Walks 10 min a day  We discussed needing to get more exercise   Oct. 15, 2020   On Timothy [name] was last seen in March, 2019.  He has a history of paroxysmal atrial fibrillation.  He has had a treadmill test which did not show any ischemia.  He recently had a coronary calcium score of 2546 with extensive three-vessel coronary artery calcification.  He is in the 92nd percentile for age/sex matched controls..  No symptoms of chest pain or dysnea. He has PAF. Was on atenolol for years and had been having "gray out periods"   No further presyncopal epidoses since stopping the atenolol .  Has a hx of CKD but creatinine has improved.  Used to exercise quite a bit,  Limited by covid.   Is staying more active now.  Going hiking .  [place] last weekend.   Going to [place]. Timothy [name] tomorrow.    He is never had any episodes of angina.  He has known chronic kidney disease.  He is had slight improvement of his renal function over the past year.  April 26, 2019:  Timothy [name] is seen  today for follow-up visit.  He has a history of paroxysmal atrial fibrillation.  He has a very high coronary calcium score of 2546 which places him in the 92nd percentile for age/gender matched controls.   Has chronic nephritis - followed by Dr. Jimmy Footman.   Has had a few CP . Has PAF .   Might last 15 min - all day   We discussed sending him to the Afib clinc if his AF is getting worse . Seems to have PAF every 2-3 weeks.      We discussed options for antiarrhythmics.  He would not be a candidate for flecainide because of his presence of coronary artery disease.  He might be a good candidate for Tikosyn or perhaps amiodarone.  He is having worsening episodes of paroxysmal atrial fibrillation.  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  We found long episodes of heart block during an event monitor in  2018.  Was on atenolol 100 mg at that time. It is now apparent that even small doses of propranolol are causing similar episodes of heart block.  I worry that we will not be able to treat him with any antiarrhythmic medications and see we have pacemaker in place.  I would like to refer him to our electrophysiology colleagues to get an opinion.  Is on repatha ( managed by Dr. Joylene Draft)   Total chol is 76 HDL = 31 LDL = 21 Trigs = 118 . Creatinine is 1.5    He is having worsening  May 21, 2020: Timothy [name] is seen today for follow up of his coronary artery calcifications and his PAF, hx of carotid stenting .   He was seen by Dr. Lovena Le for assistance in choosing an appropriate antiarrhythmic in the setting of his heart block with beta blockers. Dr. Lovena Le recommended watchful waiting at this time but noted that amiodarone would be the only option.   He would likely need a pacer at some point .  Hx of coronary artery calcification. Able to split a cord of wood yesterday without any difficulty .  Is on repatha ,  Lipids look great .  myoview in 2019 looked ok  He has CKD so we have not pursued cath or coronary CT angio given his lack of symptoms   He is back in Afib today . These will typically last for 30 minutes or so .  No real symptoms - no CP, no dyspnea Has an "anxioius" sensation   Sees Dr. Simonne Martinet for nephrology ,  Creatinine is around 2.1  Will have Dr. Simonne Martinet fax over labs.   Has had Prostate cancer , treated with XRT in the past  Sees Dr. Jeffie Pollock, who wants to do a PET scan   Past Medical History:  Diagnosis Date  . Anal fissure    ?  . Bright red rectal bleeding   . C2 cervical fracture ([place]) 06/24/2014  . Chronic renal failure   . Diverticulosis   . GERD (gastroesophageal reflux disease)   . H/O prostate cancer 06/05/2011   S/p radiation rx 2010   . Hemorrhage of rectum and anus 05/09/2011  . HLD (hyperlipidemia)   . HTN (hypertension)   . Kidney disease     acute glomeral nepritis  . Paroxysmal atrial fibrillation (HCC)   . Peripheral vascular disease ([place])    carotid artery stent ( right  )  . Prostate cancer ([place])   . 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 Sinus bradycardia  Past Surgical History:  Procedure Laterality Date  . CAROTID STENT    . ESOPHAGOGASTRODUODENOSCOPY (EGD) WITH PROPOFOL N/A 01/05/2018   Procedure: ESOPHAGOGASTRODUODENOSCOPY (EGD) WITH PROPOFOL;  Surgeon: [name], MD;  Location[facility]COPY;  Service: Endoscopy;  Laterality: N/A;  . external beam radiation to prostate  2009  . FLEXIBLE SIGMOIDOSCOPY  05/22/2011   Procedure: FLEXIBLE SIGMOIDOSCOPY;  Surgeon: [name], MD;  Location: [facility] ENDOSCOPY;  Service: Endoscopy;  Laterality: N/A;  . HOT HEMOSTASIS  05/22/2011   Procedure: HOT HEMOSTASIS (ARGON PLASMA COAGULATION/BICAP);  Surgeon: [name], MD;  Location: [facility]OSCOPY;  Service: Endoscopy;  Laterality: N/A;  . RADIOLOGY WITH ANESTHESIA N/A 01/04/2018   Procedure: MRI WITH ANESTHESIA/LUMBAR SPINE WITHOUT CONTRAST;  Surgeon: Radiologist, Medication, MD;  Location: [facility];  Service: Radiology;  Laterality: N/A;    Current Medications: Current Meds  Medication Sig  . allopurinol (ZYLOPRIM) 300 MG tablet Take 300 mg by mouth daily.   Marland Kitchen amLODipine (NORVASC) 10 MG tablet Take 10 mg by mouth daily.  Marland Kitchen apixaban (ELIQUIS) 5 MG TABS tablet Take 10 mg by mouth 2 (two) times daily.  Marland Kitchen ezetimibe (ZETIA) 10 MG tablet Take 10 mg by mouth daily.  . famotidine (PEPCID) 20 MG tablet Take 20 mg by mouth 2 (two) times daily.  . furosemide (LASIX) 20 MG tablet Take 20 mg by mouth daily.  . nitroGLYCERIN (NITROSTAT) 0.4 MG SL tablet Place 1 tablet (0.4 mg total) under the tongue every 5 (five) minutes as needed for chest pain.  Marland Kitchen REPATHA SURECLICK 938 MG/ML SOAJ Inject 140 mg into the skin every 14 (fourteen) days.  Marland Kitchen telmisartan (MICARDIS) 80 MG tablet Take 1 tablet by mouth daily.     Allergies:   Crestor  [rosuvastatin calcium], Contrast media [iodinated diagnostic agents], and Lipitor [atorvastatin]   Social History   Socioeconomic History  . Marital status: Married    Spouse name: Not on file  . Number of children: 1  . Years of education: Not on file  . Highest education level: Not on file  Occupational History  . Occupation: pharmacist  Tobacco Use  . Smoking status: Former Smoker    Types: Cigarettes  . Smokeless tobacco: Never Used  Vaping Use  . Vaping Use: Never used  Substance and Sexual Activity  . Alcohol use: Yes    Comment: occas  . Drug use: No  . Sexual activity: Yes  Other Topics Concern  . Not on file  Social History Narrative  . Not on file   Social Determinants of Health   Financial Resource Strain: Not on file  Food Insecurity: Not on file  Transportation Needs: Not on file  Physical Activity: Not on file  Stress: Not on file  Social Connections: Not on file     Family History: The patient's family history includes Atrial fibrillation in his brother; Brain cancer in his brother. There is no history of Colon cancer. ROS:   Please see the history of present illness.     All other systems reviewed and are negative.  EKGs/Labs/Other Studies Reviewed:    The following studies were reviewed today: Records and labs from Dr. Joylene Draft     Recent Labs: No results found for requested labs within last 8760 hours.  Recent Lipid Panel No results found for: CHOL, TRIG, HDL, CHOLHDL, VLDL, LDLCALC, LDLDIRECT    Physical Exam: Blood pressure 124/64, pulse 85, height 5\' 9"  (1.753 m), weight 162 lb 9.6 oz (73.8 kg), SpO2 98 %.  GEN:  Well nourished, well developed in no acute distress HEENT: Normal NECK: No JVD; No carotid bruits LYMPHATICS: No lymphadenopathy CARDIAC: irreg. Irreg. , soft murmur  RESPIRATORY:  Clear to auscultation without rales, wheezing or rhonchi  ABDOMEN: Soft, non-tender, non-distended MUSCULOSKELETAL:  No edema; No deformity   SKIN: Warm and dry NEUROLOGIC:  Alert and oriented x 3  EKG:    May 21, 2020: Atrial fib  with a controlled ventricular response.   ASSESSMENT:    1. PAF (paroxysmal atrial fibrillation) ([place])    PLAN:      1.  Paroxysmal Atrial fib: /Sick sinus syndrome     He has had episodes of worsening heart block with beta blocker therapy. He has been seen by Dr. Lovena Le who advised watchful waiting at this time.  Amiodarone would be an option but he may require pacemaker if we start antiarrhythmics.  For now his episodes of paroxysmal A. fib only seem to last for 30 minutes or so.  I do not feel compelled to start any antiarrhythmics at this time.  Continue anticoagulation.  2.  Coronary artery disease: Timothy [name] has an extremely high coronary calcium score.  The calcium distribution is is in all 3 coronary arteries. He is currently on Repatha.  His LDL is in the low 20s.  Continue current medications. He is very active.  He split a cord of wood yesterday.  He is continue Repatha.  3.  Hyperlipidemia:  Doing well on Repatha.  4. CKD :  Managed by Dr. Jeanella Anton  This office visit required a high level of clinical decision-making.  We will see him in 6 months for a follow-up office visit.   Medication Adjustments/Labs and Tests Ordered: Current medicines are reviewed at length with the patient today.  Concerns regarding medicines are outlined above.  Orders Placed This Encounter  Procedures  . EKG 12-Lead   No orders of the defined types were placed in this encounter.   Signed, Mertie Moores, MD  05/21/2020 12:08 PM    [place]

## 2020-05-21 NOTE — Patient Instructions (Signed)

## 2020-05-30 ENCOUNTER — Other Ambulatory Visit: Payer: Self-pay

## 2020-05-30 ENCOUNTER — Ambulatory Visit (HOSPITAL_COMMUNITY)
Admission: RE | Admit: 2020-05-30 | Discharge: 2020-05-30 | Disposition: A | Discharge status: Home / Self Care | Payer: Medicare Other | Source: Ambulatory Visit | Attending: Urology | Admitting: Urology

## 2020-05-30 DIAGNOSIS — C61 Malignant neoplasm of prostate: Secondary | ICD-10-CM | POA: Insufficient documentation

## 2020-05-30 DIAGNOSIS — R9721 Rising PSA following treatment for malignant neoplasm of prostate: Secondary | ICD-10-CM | POA: Diagnosis not present

## 2020-05-30 MED ORDER — PIFLIFOLASTAT F 18 (PYLARIFY) INJECTION
9.0000 | Freq: Once | INTRAVENOUS | Status: AC
  Administered 2020-05-30: 8 via INTRAVENOUS

## 2020-06-12 ENCOUNTER — Ambulatory Visit (INDEPENDENT_AMBULATORY_CARE_PROVIDER_SITE_OTHER): Payer: Medicare Other

## 2020-06-12 ENCOUNTER — Ambulatory Visit (INDEPENDENT_AMBULATORY_CARE_PROVIDER_SITE_OTHER): Payer: Medicare Other | Admitting: Family Medicine

## 2020-06-12 ENCOUNTER — Other Ambulatory Visit: Payer: Self-pay

## 2020-06-12 VITALS — Ht 68.0 in | Wt 161.6 lb

## 2020-06-12 DIAGNOSIS — M25551 Pain in right hip: Secondary | ICD-10-CM

## 2020-06-12 DIAGNOSIS — M25552 Pain in left hip: Secondary | ICD-10-CM

## 2020-06-12 NOTE — Progress Notes (Signed)
Office Visit Note   Patient: Timothy [name]           Date of Birth: 29-Jul-1940           MRN: [id number] Visit Date: 06/12/2020 Requested by: Crist Infante, MD [address] [place],  [place] [address] PCP: Crist Infante, MD  Subjective: Chief Complaint  Patient presents with  . Right Hip - Pain    Not having much pain in the hips --- he said he usually feels pain in the lower back. Any pain he had in his leg/back, he attributed to his back. 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  . Left Hip - Pain    HPI: He is here with concerns about his hips.  He recently saw his urologist regarding rising PSA level after treatment for prostate cancer about 12 years ago.  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  He denies any groin pain.  He does have some occasional posterior hip pain.              ROS:   All other systems were reviewed and are negative.  Objective: Vital Signs: Ht 5\' 8"  (1.727 m)   Wt 161 lb 9.6 oz (73.3 kg)   BMI 24.57 kg/m   Physical Exam:  General:  Alert and oriented, in no acute distress. Pulm:  Breathing unlabored. Psy:  Normal mood, congruent affect.  Hips: Slightly limited internal rotation motion on the left but no pain.  Good range of motion on the right with no pain.  No pain with isometric strength testing of each hip.  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  No evidence of collapse of the femoral heads.  Possibly some early sclerotic change of the left femoral head but overall, I do not see any significant change since plain x-rays from 2019.   Assessment & Plan: 1.  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 -No specific treatment at this point other than maintain his active lifestyle, do range of motion and strengthening  exercises at home.  Maintain optimum vitamin D levels.  If he starts developing groin pain, he will come in for recheck.     Procedures: No procedures performed        PMFS History: Patient Active Problem List   Diagnosis Date Noted  . Sinus pause 04/26/2019  . CAD (coronary artery disease) 12/09/2018  . GI bleed 01/04/2018  . Upper GI bleed 01/02/2018  . Coffee ground emesis 01/02/2018  . AKI (acute kidney injury) ([place]) 01/02/2018  . Leukocytosis 01/02/2018  . Gout 01/02/2018  . Lumbar back pain 12/24/2017  . Sinus bradycardia   . Prostate cancer ([place])   . Peripheral vascular disease ([place])   . Paroxysmal atrial fibrillation (HCC)   . Kidney disease   . HLD (hyperlipidemia)   . GERD (gastroesophageal reflux disease)   . Diverticulosis   . Chronic renal failure   . Bright red rectal bleeding   . Anal fissure   . PAF (paroxysmal atrial fibrillation) ([place]) 11/28/2016  . Near syncope 11/28/2016  . Palpitations 10/28/2016  . Transient cerebral ischemia 10/28/2016  . Mixed hyperlipidemia 10/28/2016  . C2 cervical fracture ([place]) 06/24/2014  . HTN (hypertension) 06/05/2011  . H/O prostate  cancer 06/05/2011  . 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 Hemorrhage of rectum and anus 05/09/2011   Past Medical History:  Diagnosis Date  . Anal fissure    ?  . Bright red rectal bleeding   . C2 cervical fracture ([place]) 06/24/2014  . Chronic renal failure   . Diverticulosis   . GERD (gastroesophageal reflux disease)   . H/O prostate cancer 06/05/2011   S/p radiation rx 2010   . Hemorrhage of rectum and anus 05/09/2011  . HLD (hyperlipidemia)   . HTN (hypertension)   . Kidney disease    acute glomeral nepritis  . Paroxysmal atrial fibrillation (HCC)   . Peripheral vascular disease ([place])    carotid artery stent ( right  )  . Prostate cancer ([place])   . 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 Sinus bradycardia     Family History  Problem Relation Age of Onset  . Brain  cancer Brother   . Atrial fibrillation Brother   . Colon cancer Neg Hx     Past Surgical History:  Procedure Laterality Date  . CAROTID STENT    . ESOPHAGOGASTRODUODENOSCOPY (EGD) WITH PROPOFOL N/A 01/05/2018   Procedure: ESOPHAGOGASTRODUODENOSCOPY (EGD) WITH PROPOFOL;  Surgeon: [name], MD;  Location: [facility] ENDOSCOPY;  Service: Endoscopy;  Laterality: N/A;  . external beam radiation to prostate  2009  . FLEXIBLE SIGMOIDOSCOPY  05/22/2011   Procedure: FLEXIBLE SIGMOIDOSCOPY;  Surgeon: [name], MD;  Location: [facility] ENDOSCOPY;  Service: Endoscopy;  Laterality: N/A;  . HOT HEMOSTASIS  05/22/2011   Procedure: HOT HEMOSTASIS (ARGON PLASMA COAGULATION/BICAP);  Surgeon: [name], MD;  Location: [facility];  Service: Endoscopy;  Laterality: N/A;  . RADIOLOGY WITH ANESTHESIA N/A 01/04/2018   Procedure: MRI WITH ANESTHESIA/LUMBAR SPINE WITHOUT CONTRAST;  Surgeon: Radiologist, Medication, MD;  Location: [facility];  Service: Radiology;  Laterality: N/A;   Social History   Occupational History  . Occupation: pharmacist  Tobacco Use  . Smoking status: Former Smoker    Types: Cigarettes  . Smokeless tobacco: Never Used  Vaping Use  . Vaping Use: Never used  Substance and Sexual Activity  . Alcohol use: Yes    Comment: occas  . Drug use: No  . Sexual activity: Yes

## 2020-07-17 DIAGNOSIS — H00011 Hordeolum externum right upper eyelid: Secondary | ICD-10-CM | POA: Diagnosis not present

## 2020-08-13 DIAGNOSIS — N2581 Secondary hyperparathyroidism of renal origin: Secondary | ICD-10-CM | POA: Diagnosis not present

## 2020-08-13 DIAGNOSIS — N184 Chronic kidney disease, stage 4 (severe): Secondary | ICD-10-CM | POA: Diagnosis not present

## 2020-08-13 DIAGNOSIS — I129 Hypertensive chronic kidney disease with stage 1 through stage 4 chronic kidney disease, or unspecified chronic kidney disease: Secondary | ICD-10-CM | POA: Diagnosis not present

## 2020-08-13 DIAGNOSIS — E785 Hyperlipidemia, unspecified: Secondary | ICD-10-CM | POA: Diagnosis not present

## 2020-08-13 DIAGNOSIS — M109 Gout, unspecified: Secondary | ICD-10-CM | POA: Diagnosis not present

## 2020-08-13 DIAGNOSIS — D631 Anemia in chronic kidney disease: Secondary | ICD-10-CM | POA: Diagnosis not present

## 2020-08-13 DIAGNOSIS — I4891 Unspecified atrial fibrillation: Secondary | ICD-10-CM | POA: Diagnosis not present

## 2020-08-13 DIAGNOSIS — N189 Chronic kidney disease, unspecified: Secondary | ICD-10-CM | POA: Diagnosis not present

## 2020-08-20 DIAGNOSIS — L578 Other skin changes due to chronic exposure to nonionizing radiation: Secondary | ICD-10-CM | POA: Diagnosis not present

## 2020-08-20 DIAGNOSIS — L820 Inflamed seborrheic keratosis: Secondary | ICD-10-CM | POA: Diagnosis not present

## 2020-08-20 DIAGNOSIS — Z85828 Personal history of other malignant neoplasm of skin: Secondary | ICD-10-CM | POA: Diagnosis not present

## 2020-08-20 DIAGNOSIS — L718 Other rosacea: Secondary | ICD-10-CM | POA: Diagnosis not present

## 2020-08-20 DIAGNOSIS — D485 Neoplasm of uncertain behavior of skin: Secondary | ICD-10-CM | POA: Diagnosis not present

## 2020-08-20 DIAGNOSIS — L821 Other seborrheic keratosis: Secondary | ICD-10-CM | POA: Diagnosis not present

## 2020-08-20 DIAGNOSIS — Q828 Other specified congenital malformations of skin: Secondary | ICD-10-CM | POA: Diagnosis not present

## 2020-08-20 DIAGNOSIS — D692 Other nonthrombocytopenic purpura: Secondary | ICD-10-CM | POA: Diagnosis not present

## 2020-10-24 DIAGNOSIS — I480 Paroxysmal atrial fibrillation: Secondary | ICD-10-CM | POA: Diagnosis not present

## 2020-10-24 DIAGNOSIS — N184 Chronic kidney disease, stage 4 (severe): Secondary | ICD-10-CM | POA: Diagnosis not present

## 2020-10-24 DIAGNOSIS — I10 Essential (primary) hypertension: Secondary | ICD-10-CM | POA: Diagnosis not present

## 2020-10-24 DIAGNOSIS — E785 Hyperlipidemia, unspecified: Secondary | ICD-10-CM | POA: Diagnosis not present

## 2020-10-30 DIAGNOSIS — M11232 Other chondrocalcinosis, left wrist: Secondary | ICD-10-CM | POA: Diagnosis not present

## 2020-10-30 DIAGNOSIS — Z23 Encounter for immunization: Secondary | ICD-10-CM | POA: Diagnosis not present

## 2020-10-30 DIAGNOSIS — M25532 Pain in left wrist: Secondary | ICD-10-CM | POA: Diagnosis not present

## 2020-10-30 DIAGNOSIS — L03114 Cellulitis of left upper limb: Secondary | ICD-10-CM | POA: Diagnosis not present

## 2020-10-30 DIAGNOSIS — M109 Gout, unspecified: Secondary | ICD-10-CM | POA: Diagnosis not present

## 2020-11-14 DIAGNOSIS — Z23 Encounter for immunization: Secondary | ICD-10-CM | POA: Diagnosis not present

## 2020-12-04 ENCOUNTER — Ambulatory Visit: Payer: Self-pay

## 2020-12-04 ENCOUNTER — Ambulatory Visit (INDEPENDENT_AMBULATORY_CARE_PROVIDER_SITE_OTHER): Payer: Medicare Other | Admitting: Orthopaedic Surgery

## 2020-12-04 ENCOUNTER — Encounter: Payer: Self-pay | Admitting: Orthopaedic Surgery

## 2020-12-04 ENCOUNTER — Other Ambulatory Visit: Payer: Self-pay

## 2020-12-04 DIAGNOSIS — M25571 Pain in right ankle and joints of right foot: Secondary | ICD-10-CM

## 2020-12-04 DIAGNOSIS — I2510 Atherosclerotic heart disease of native coronary artery without angina pectoris: Secondary | ICD-10-CM | POA: Diagnosis not present

## 2020-12-04 NOTE — Progress Notes (Signed)
Office Visit Note   Patient: Timothy [name]           Date of Birth: 09/27/1940           MRN: [id number] Visit Date: 12/04/2020              Requested by: Crist Infante, MD [address] [place],  [place] [address] PCP: Crist Infante, MD   Assessment & Plan: Visit Diagnoses:  1. Pain in right ankle and joints of right foot     Plan: Impression is probable right ankle contusion as there are fractures or other acute findings on x-ray today.  I believe he has excess swelling and ecchymosis likely from the Eliquis he takes on a daily basis.  At this point, I recommended placing him in a cam walker weightbearing as tolerated for the next few weeks.  We will then transition him into an ASO brace as tolerated.  Should he still have pain 6 weeks from now, he will follow-up for repeat evaluation.  Call with concerns or questions in the meantime.  Follow-Up Instructions: Return if symptoms worsen or fail to improve.   Orders:  Orders Placed This Encounter  Procedures   XR Ankle Complete Right   No orders of the defined types were placed in this encounter.     Procedures: No procedures performed   Clinical Data: No additional findings.   Subjective: Chief Complaint  Patient presents with   Right Ankle - Pain    HPI patient is a pleasant 80 year old gentleman who comes in today with right ankle pain.  About a week ago, he fell rolling his right ankle.  The pain he initially had was to the medial and lateral aspects.  The pain has improved and is primarily to the medial ankle.  Worse with bearing weight.  He has been taking Tylenol as needed.  He is unable to take NSAIDs due to atrial fibrillation for which she is anticoagulated with Eliquis.  Review of Systems as detailed in HPI.  All others reviewed and are negative.   Objective: Vital Signs: There were no vitals taken for this visit.  Physical Exam well-developed well-nourished gentleman in no acute distress.  Alert and  oriented x3.  Ortho Exam right ankle exam shows moderate swelling and ecchymosis to the medial aspect.  Moderate tenderness along the medial malleolus.  No tenderness to the lateral ankle or ATFL.  Painless range of motion of the ankle.  He is neurovascular intact distally.  Specialty Comments:  No specialty comments available.  Imaging: XR Ankle Complete Right  Result Date: 12/04/2020 No acute or structural abnormalities    PMFS History: Patient Active Problem List   Diagnosis Date Noted   Sinus pause 04/26/2019   CAD (coronary artery disease) 12/09/2018   GI bleed 01/04/2018   Upper GI bleed 01/02/2018   Coffee ground emesis 01/02/2018   AKI (acute kidney injury) ([place]) 01/02/2018   Leukocytosis 01/02/2018   Gout 01/02/2018   Lumbar back pain 12/24/2017   Sinus bradycardia    Prostate cancer (HCC)    Peripheral vascular disease (HCC)    Paroxysmal atrial fibrillation (HCC)    Kidney disease    HLD (hyperlipidemia)    GERD (gastroesophageal reflux disease)    Diverticulosis    Chronic renal failure    Bright red rectal bleeding    Anal fissure    PAF (paroxysmal atrial fibrillation) ([place]) 11/28/2016   Near syncope 11/28/2016   Palpitations 10/28/2016   Transient  cerebral ischemia 10/28/2016   Mixed hyperlipidemia 10/28/2016   C2 cervical fracture ([place]) 06/24/2014   HTN (hypertension) 06/05/2011   H/O prostate cancer 06/05/2011   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   Sinus bradycardia     Family History  Problem Relation Age of Onset   Brain cancer Brother    Atrial fibrillation Brother    Colon cancer Neg Hx     Past Surgical History:  Procedure Laterality Date   CAROTID STENT     ESOPHAGOGASTRODUODENOSCOPY (EGD) WITH PROPOFOL N/A 01/05/2018   Procedure: ESOPHAGOGASTRODUODENOSCOPY (EGD) WITH PROPOFOL;  Surgeon: [name], MD;  Location: [facility] ENDOSCOPY;  Service: Endoscopy;  Laterality: N/A;   external beam radiation to prostate  2009   FLEXIBLE SIGMOIDOSCOPY  05/22/2011   Procedure: FLEXIBLE SIGMOIDOSCOPY;  Surgeon: [name], MD;  Location: [facility] ENDOSCOPY;  Service: Endoscopy;  Laterality: N/A;   HOT HEMOSTASIS  05/22/2011   Procedure: HOT HEMOSTASIS (ARGON PLASMA COAGULATION/BICAP);  Surgeon: [name], MD;  Location: [facility];  Service: Endoscopy;  Laterality: N/A;   RADIOLOGY WITH ANESTHESIA N/A 01/04/2018   Procedure: MRI WITH ANESTHESIA/LUMBAR SPINE WITHOUT CONTRAST;  Surgeon: Radiologist, Medication, MD;  Location: [facility];  Service: Radiology;  Laterality: N/A;   Social History   Occupational History   Occupation: Software engineer  Tobacco Use   Smoking status: Former    Types: Cigarettes   Smokeless tobacco: Never  Vaping Use   Vaping Use: Never used  Substance and Sexual Activity   Alcohol use: Yes    Comment: occas   Drug use: No   Sexual activity: Yes

## 2020-12-23 DIAGNOSIS — Z1152 Encounter for screening for COVID-19: Secondary | ICD-10-CM | POA: Diagnosis not present

## 2021-01-04 DIAGNOSIS — I129 Hypertensive chronic kidney disease with stage 1 through stage 4 chronic kidney disease, or unspecified chronic kidney disease: Secondary | ICD-10-CM | POA: Diagnosis not present

## 2021-01-04 DIAGNOSIS — I4891 Unspecified atrial fibrillation: Secondary | ICD-10-CM | POA: Diagnosis not present

## 2021-01-04 DIAGNOSIS — M109 Gout, unspecified: Secondary | ICD-10-CM | POA: Diagnosis not present

## 2021-01-04 DIAGNOSIS — N184 Chronic kidney disease, stage 4 (severe): Secondary | ICD-10-CM | POA: Diagnosis not present

## 2021-01-04 DIAGNOSIS — D631 Anemia in chronic kidney disease: Secondary | ICD-10-CM | POA: Diagnosis not present

## 2021-01-04 DIAGNOSIS — E785 Hyperlipidemia, unspecified: Secondary | ICD-10-CM | POA: Diagnosis not present

## 2021-01-04 DIAGNOSIS — N2581 Secondary hyperparathyroidism of renal origin: Secondary | ICD-10-CM | POA: Diagnosis not present

## 2021-01-21 DIAGNOSIS — Z125 Encounter for screening for malignant neoplasm of prostate: Secondary | ICD-10-CM | POA: Diagnosis not present

## 2021-01-21 DIAGNOSIS — I10 Essential (primary) hypertension: Secondary | ICD-10-CM | POA: Diagnosis not present

## 2021-01-21 DIAGNOSIS — R7301 Impaired fasting glucose: Secondary | ICD-10-CM | POA: Diagnosis not present

## 2021-01-21 DIAGNOSIS — M109 Gout, unspecified: Secondary | ICD-10-CM | POA: Diagnosis not present

## 2021-01-21 DIAGNOSIS — E785 Hyperlipidemia, unspecified: Secondary | ICD-10-CM | POA: Diagnosis not present

## 2021-01-21 DIAGNOSIS — R946 Abnormal results of thyroid function studies: Secondary | ICD-10-CM | POA: Diagnosis not present

## 2021-01-23 DIAGNOSIS — I2510 Atherosclerotic heart disease of native coronary artery without angina pectoris: Secondary | ICD-10-CM | POA: Diagnosis not present

## 2021-01-23 DIAGNOSIS — Z1152 Encounter for screening for COVID-19: Secondary | ICD-10-CM | POA: Diagnosis not present

## 2021-01-23 DIAGNOSIS — E785 Hyperlipidemia, unspecified: Secondary | ICD-10-CM | POA: Diagnosis not present

## 2021-01-23 DIAGNOSIS — I10 Essential (primary) hypertension: Secondary | ICD-10-CM | POA: Diagnosis not present

## 2021-01-23 DIAGNOSIS — I480 Paroxysmal atrial fibrillation: Secondary | ICD-10-CM | POA: Diagnosis not present

## 2021-01-25 DIAGNOSIS — Z1212 Encounter for screening for malignant neoplasm of rectum: Secondary | ICD-10-CM | POA: Diagnosis not present

## 2021-01-25 DIAGNOSIS — K2270 Barrett's esophagus without dysplasia: Secondary | ICD-10-CM | POA: Diagnosis not present

## 2021-01-25 DIAGNOSIS — Z1331 Encounter for screening for depression: Secondary | ICD-10-CM | POA: Diagnosis not present

## 2021-01-25 DIAGNOSIS — I2510 Atherosclerotic heart disease of native coronary artery without angina pectoris: Secondary | ICD-10-CM | POA: Diagnosis not present

## 2021-01-25 DIAGNOSIS — R82998 Other abnormal findings in urine: Secondary | ICD-10-CM | POA: Diagnosis not present

## 2021-01-25 DIAGNOSIS — N184 Chronic kidney disease, stage 4 (severe): Secondary | ICD-10-CM | POA: Diagnosis not present

## 2021-01-25 DIAGNOSIS — Z0000 Encounter for general adult medical examination without abnormal findings: Secondary | ICD-10-CM | POA: Diagnosis not present

## 2021-01-25 DIAGNOSIS — Z95828 Presence of other vascular implants and grafts: Secondary | ICD-10-CM | POA: Diagnosis not present

## 2021-01-25 DIAGNOSIS — Z23 Encounter for immunization: Secondary | ICD-10-CM | POA: Diagnosis not present

## 2021-01-25 DIAGNOSIS — M11232 Other chondrocalcinosis, left wrist: Secondary | ICD-10-CM | POA: Diagnosis not present

## 2021-01-25 DIAGNOSIS — D6869 Other thrombophilia: Secondary | ICD-10-CM | POA: Diagnosis not present

## 2021-01-25 DIAGNOSIS — I10 Essential (primary) hypertension: Secondary | ICD-10-CM | POA: Diagnosis not present

## 2021-01-25 DIAGNOSIS — I739 Peripheral vascular disease, unspecified: Secondary | ICD-10-CM | POA: Diagnosis not present

## 2021-01-25 DIAGNOSIS — C61 Malignant neoplasm of prostate: Secondary | ICD-10-CM | POA: Diagnosis not present

## 2021-01-25 DIAGNOSIS — I480 Paroxysmal atrial fibrillation: Secondary | ICD-10-CM | POA: Diagnosis not present

## 2021-01-25 DIAGNOSIS — R011 Cardiac murmur, unspecified: Secondary | ICD-10-CM | POA: Diagnosis not present

## 2021-01-25 DIAGNOSIS — Z1339 Encounter for screening examination for other mental health and behavioral disorders: Secondary | ICD-10-CM | POA: Diagnosis not present

## 2021-02-04 ENCOUNTER — Ambulatory Visit (HOSPITAL_COMMUNITY)
Admission: RE | Admit: 2021-02-04 | Discharge: 2021-02-04 | Disposition: A | Discharge status: Home / Self Care | Payer: Medicare Other | Source: Ambulatory Visit | Attending: Physician Assistant | Admitting: Physician Assistant

## 2021-02-04 ENCOUNTER — Other Ambulatory Visit: Payer: Self-pay

## 2021-02-04 ENCOUNTER — Other Ambulatory Visit (HOSPITAL_COMMUNITY): Payer: Self-pay | Admitting: Internal Medicine

## 2021-02-04 DIAGNOSIS — I779 Disorder of arteries and arterioles, unspecified: Secondary | ICD-10-CM

## 2021-03-28 DIAGNOSIS — D485 Neoplasm of uncertain behavior of skin: Secondary | ICD-10-CM | POA: Diagnosis not present

## 2021-03-28 DIAGNOSIS — Z85828 Personal history of other malignant neoplasm of skin: Secondary | ICD-10-CM | POA: Diagnosis not present

## 2021-03-28 DIAGNOSIS — L570 Actinic keratosis: Secondary | ICD-10-CM | POA: Diagnosis not present

## 2021-04-16 DIAGNOSIS — Z20822 Contact with and (suspected) exposure to covid-19: Secondary | ICD-10-CM | POA: Diagnosis not present

## 2021-04-24 ENCOUNTER — Encounter: Payer: Self-pay | Admitting: Gastroenterology

## 2021-05-03 DIAGNOSIS — Z1159 Encounter for screening for other viral diseases: Secondary | ICD-10-CM | POA: Diagnosis not present

## 2021-05-28 DIAGNOSIS — Z1159 Encounter for screening for other viral diseases: Secondary | ICD-10-CM | POA: Diagnosis not present

## 2021-06-07 ENCOUNTER — Ambulatory Visit (INDEPENDENT_AMBULATORY_CARE_PROVIDER_SITE_OTHER): Payer: Medicare Other | Admitting: Gastroenterology

## 2021-06-07 ENCOUNTER — Encounter: Payer: Self-pay | Admitting: Gastroenterology

## 2021-06-07 VITALS — BP 142/62 | HR 59 | Ht 68.0 in | Wt 161.4 lb

## 2021-06-07 DIAGNOSIS — K219 Gastro-esophageal reflux disease without esophagitis: Secondary | ICD-10-CM

## 2021-06-07 DIAGNOSIS — K449 Diaphragmatic hernia without obstruction or gangrene: Secondary | ICD-10-CM

## 2021-06-07 NOTE — Patient Instructions (Addendum)
If you are age 79 or older, your body mass index should be between 23-30. Your Body mass index is 24.54 kg/m?Marland Kitchen If this is out of the aforementioned range listed, please consider follow up with your Primary Care Provider. ? ?If you are age 73 or younger, your body mass index should be between 19-25. Your Body mass index is 24.54 kg/m?Marland Kitchen If this is out of the aformentioned range listed, please consider follow up with your Primary Care Provider.  ? ?________________________________________________________ ? ?The [facility] providers would like to encourage you to use [facility] to communicate with providers for non-urgent requests or questions.  Due to long hold times on the telephone, sending your provider a message by [facility] may be a faster and more efficient way to get a response.  Please allow 48 business hours for a response.  Please remember that this is for non-urgent requests.  ?_______________________________________________________ ? ?Continue current medications. ? ?Call with any questions or concerns. ? ?Thank you, ? ?Dr. [name] ? ? ? ? ? ?We want to thank you for trusting [facility] with your care. All of our staff and providers value the relationships we have built with our patients, and it is an honor to care for you.  ? ?We are writing to let you know that [facility] will close on Jul 08, 2021, and we invite you to continue to see Dr. Carmell Austria and Gerrit Heck at the [facility] [place] office location. We are consolidating our serices at these [facility] practices to better provide care. Our office staff will work with you to ensure a seamless transition.  ? ?Gerrit Heck, DO -Dr. Bryan Lemma will be movig to [facility] at [address] [place], [place] [address], effective Jul 08, 2021.  Contact [phone] to schedule an appointment with him.  ? ?Carmell Austria, MD- Dr. Lyndel Safe will be movig to [facility] at  [address] [place], [place] [address], effective Jul 08, 2021.  Contact [phone] to schedule an appointment with him.  ? ?Requesting Medical Records ?If you need to request your medical records, please follow the instructions below. Your medical records are confidential, and a copy can be transferred to another provider or released to you or another person you designate only with your permission. ? ?There are several ways to request your medical records: ?Requests for medical records can be submitted through our practice.   ?You can also request your records electronically, in your MyChart account by selecting the ?Request Health Records? tab.  ?If you need additional information on how to request records, please go to [email or web address] choose Patient Information, then select Request Medical Records. ?To make an appointment or if you have any questions about your health care needs, please contact our office at [phone] and one of our staff members will be glad to assist you. ?[place] is committed to providing exceptional care for you and our community. Thank you for allowing [place] to serve your health care needs. ?Sincerely, ? ?Windy Canny, Director [facility] ?[facility] also offers convenient virtual care options. Sore throat? Sinus problems? Cold or flu symptoms? Get care from the comfort of home with [facility] Video Visits and e-Visits. Learn more about the non-emergency conditions treated and start your virtual visit at [email or web address] ? ?

## 2021-06-07 NOTE — Progress Notes (Signed)
? ? ?Chief Complaint: FU ? ?Referring Provider:  Crist Infante, MD    ? ? ?ASSESSMENT AND PLAN;  ? ?#1. GERD with small HH, short segment Barrett's esophagus on EGD 02/2018 ? ?#2. H/O UGI bleed d/t gastric polyp s/p endoscopic treatment 12/2017.  Hb stable (resolved). ? ?#3.  Occasional constipation. Neg colon 02/2018.  Has mild radiation proctitis. ? ?#4.  Comorbid conditions. A.fib on Eliquis, CRI (Dr Deterding), HTN, HLD ? ?Plan: ? ?-Continue pepcid 20 mg p.o. once a day.  He wants to avoid PPIs. ?-He wants to hold off on EGD at this time. I agree ?-Call if any problems.  All contact numbers were given. ? ? ?HPI:   ? ?Timothy [name] is a 81 y.o. male  ?Retired Software engineer. ?For follow-up visit ? ?Doing really well. ? ?No GI complaints. ? ?He got a letter to repeat EGD due to intestinal metaplasia suggestive of Barrett's on previous EGD with biopsies. ? ?He has stopped omeprazole (as advised by nephrology) and successfully transition to Pepcid 20 mg p.o. once a day.  Denies having any breakthrough symptoms.  No odynophagia or dysphagia. ? ?He continues to be on Eliquis for A-fib. ? ?Would like to hold off on EGD due to advanced age and does not want to come off Eliquis as he is doing very well.  I do agree. ? ?Denies having any diarrhea or constipation.  No weight loss. ? ?Had blood work performed by Dr. Joylene Draft as part of physical.  Per patient, he had normal CBC. ? ? ?Past GI procedures: ?-Colonoscopy 02/2018-mild sigmoid diverticulosis, mild radiation proctitis, small internal hemorrhoids. ?-EGD 03/12/2018-irregular Z line, small hiatal hernia, gastric polyp status post polypectomy x3 (Bx-fundic gland polyps), 2 submucosal duodenal polyps with negative biopsies. Declined EUS. ?Past Medical History:  ?Diagnosis Date  ?? Anal fissure   ? ?  ?? Bright red rectal bleeding   ?? C2 cervical fracture ([place]) 06/24/2014  ?? Chronic renal failure   ?? Diverticulosis   ?? GERD (gastroesophageal reflux disease)   ?? H/O prostate  cancer 06/05/2011  ? S/p radiation rx 2010   ?? Hemorrhage of rectum and anus 05/09/2011  ?? HLD (hyperlipidemia)   ?? HTN (hypertension)   ?? Kidney disease   ? acute glomeral nepritis  ?? Paroxysmal atrial fibrillation (HCC)   ?? Peripheral vascular disease ([place])   ? carotid artery stent ( right  )  ?? Prostate cancer ([place])   ?? 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 Sinus bradycardia   ? ? ?Past Surgical History:  ?Procedure Laterality Date  ?? CAROTID STENT    ?? ESOPHAGOGASTRODUODENOSCOPY (EGD) WITH PROPOFOL N/A 01/05/2018  ? Procedure: ESOPHAGOGASTRODUODENOSCOPY (EGD) WITH PROPOFOL;  Surgeon: [name], MD;  Location: [facility] ENDOSCOPY;  Service: Endoscopy;  Laterality: N/A;  ?? external beam radiation to prostate  2009  ?? FLEXIBLE SIGMOIDOSCOPY  05/22/2011  ? Procedure: FLEXIBLE SIGMOIDOSCOPY;  Surgeon: [name], MD;  Location: [facility] ENDOSCOPY;  Service: Endoscopy;  Laterality: N/A;  ?? HOT HEMOSTASIS  05/22/2011  ? Procedure: HOT HEMOSTASIS (ARGON PLASMA COAGULATION/BICAP);  Surgeon: [name], MD;  Location: [facility] ENDOSCOPY;  Service: Endoscopy;  Laterality: N/A;  ?? RADIOLOGY WITH ANESTHESIA N/A 01/04/2018  ? Procedure: MRI WITH ANESTHESIA/LUMBAR SPINE WITHOUT CONTRAST;  Surgeon: Radiologist, Medication, MD;  Location: [facility];  Service: Radiology;  Laterality: N/A;  ? ? ?Family History  ?Problem Relation Age of Onset  ?? Brain cancer Brother   ?? Atrial fibrillation Brother   ?? Colon cancer  Neg Hx   ?? Rectal cancer Neg Hx   ?? Stomach cancer Neg Hx   ?? Esophageal cancer Neg Hx   ? ? ?Social History  ? ?Tobacco Use  ?? Smoking status: Former  ?  Types: Cigarettes  ?? Smokeless tobacco: Never  ?Vaping Use  ?? Vaping Use: Never used  ?Substance Use Topics  ?? Alcohol use: Yes  ?  Comment: occas  ?? Drug use: No  ? ? ?Current Outpatient Medications  ?Medication Sig Dispense Refill  ?? allopurinol (ZYLOPRIM) 300 MG tablet Take 300 mg by mouth daily.     ?? amLODipine (NORVASC) 10 MG tablet Take 10 mg by  mouth daily.    ?? apixaban (ELIQUIS) 5 MG TABS tablet Take 10 mg by mouth 2 (two) times daily.    ?? ezetimibe (ZETIA) 10 MG tablet Take 10 mg by mouth daily.    ?? famotidine (PEPCID) 20 MG tablet Take 20 mg by mouth 2 (two) times daily.    ?? furosemide (LASIX) 20 MG tablet Take 20 mg by mouth daily.    ?? nitroGLYCERIN (NITROSTAT) 0.4 MG SL tablet Place 1 tablet (0.4 mg total) under the tongue every 5 (five) minutes as needed for chest pain. 25 tablet 6  ?? REPATHA SURECLICK 809 MG/ML SOAJ Inject 140 mg into the skin every 14 (fourteen) days.    ?? telmisartan (MICARDIS) 80 MG tablet Take 1 tablet by mouth daily.    ? ?No current facility-administered medications for this visit.  ? ? ?Allergies  ?Allergen Reactions  ?? Crestor [Rosuvastatin Calcium] Other (See Comments)  ?  Muscle aches  ?? Contrast Media [Iodinated Contrast Media]   ?  esi x2 with no reaction 12/2017 and 01/2018  ?? Lipitor [Atorvastatin]   ? ? ?Review of Systems:  ?neg ? ?  ? ?Physical Exam:   ? ?BP (!) 142/62   Pulse (!) 59   Ht '5\' 8"'$  (1.727 m)   Wt 161 lb 6 oz (73.2 kg)   SpO2 96%   BMI 24.54 kg/m?  ?Filed Weights  ? 06/07/21 1018  ?Weight: 161 lb 6 oz (73.2 kg)  ? ?Gen: awake, alert, NAD ?HEENT: anicteric, no pallor ?CV: A Fib ?Pulm: CTA b/l ?Abd: soft, NT/ND, +BS throughout ?Ext: no c/c/e ?Neuro: nonfocal ? ? ?Carmell Austria, MD 06/07/2021, 10:31 AM ? ?Cc: Crist Infante, MD ? ? ?

## 2021-06-12 DIAGNOSIS — C61 Malignant neoplasm of prostate: Secondary | ICD-10-CM | POA: Diagnosis not present

## 2021-06-17 DIAGNOSIS — Z20822 Contact with and (suspected) exposure to covid-19: Secondary | ICD-10-CM | POA: Diagnosis not present

## 2021-06-18 DIAGNOSIS — Z7901 Long term (current) use of anticoagulants: Secondary | ICD-10-CM | POA: Diagnosis not present

## 2021-06-18 DIAGNOSIS — K922 Gastrointestinal hemorrhage, unspecified: Secondary | ICD-10-CM | POA: Diagnosis not present

## 2021-06-18 DIAGNOSIS — C61 Malignant neoplasm of prostate: Secondary | ICD-10-CM | POA: Diagnosis not present

## 2021-06-18 DIAGNOSIS — M109 Gout, unspecified: Secondary | ICD-10-CM | POA: Diagnosis not present

## 2021-06-18 DIAGNOSIS — I129 Hypertensive chronic kidney disease with stage 1 through stage 4 chronic kidney disease, or unspecified chronic kidney disease: Secondary | ICD-10-CM | POA: Diagnosis not present

## 2021-06-18 DIAGNOSIS — K219 Gastro-esophageal reflux disease without esophagitis: Secondary | ICD-10-CM | POA: Diagnosis not present

## 2021-06-18 DIAGNOSIS — D631 Anemia in chronic kidney disease: Secondary | ICD-10-CM | POA: Diagnosis not present

## 2021-06-18 DIAGNOSIS — N184 Chronic kidney disease, stage 4 (severe): Secondary | ICD-10-CM | POA: Diagnosis not present

## 2021-06-18 DIAGNOSIS — I779 Disorder of arteries and arterioles, unspecified: Secondary | ICD-10-CM | POA: Diagnosis not present

## 2021-06-18 DIAGNOSIS — I4891 Unspecified atrial fibrillation: Secondary | ICD-10-CM | POA: Diagnosis not present

## 2021-06-18 DIAGNOSIS — N189 Chronic kidney disease, unspecified: Secondary | ICD-10-CM | POA: Diagnosis not present

## 2021-06-18 DIAGNOSIS — N2581 Secondary hyperparathyroidism of renal origin: Secondary | ICD-10-CM | POA: Diagnosis not present

## 2021-06-18 DIAGNOSIS — E785 Hyperlipidemia, unspecified: Secondary | ICD-10-CM | POA: Diagnosis not present

## 2021-06-19 DIAGNOSIS — R351 Nocturia: Secondary | ICD-10-CM | POA: Diagnosis not present

## 2021-06-19 DIAGNOSIS — C61 Malignant neoplasm of prostate: Secondary | ICD-10-CM | POA: Diagnosis not present

## 2021-06-24 DIAGNOSIS — Z1159 Encounter for screening for other viral diseases: Secondary | ICD-10-CM | POA: Diagnosis not present

## 2021-07-25 DIAGNOSIS — H43811 Vitreous degeneration, right eye: Secondary | ICD-10-CM | POA: Diagnosis not present

## 2021-07-25 DIAGNOSIS — H353111 Nonexudative age-related macular degeneration, right eye, early dry stage: Secondary | ICD-10-CM | POA: Diagnosis not present

## 2021-07-25 DIAGNOSIS — H3500 Unspecified background retinopathy: Secondary | ICD-10-CM | POA: Diagnosis not present

## 2021-07-25 DIAGNOSIS — H52203 Unspecified astigmatism, bilateral: Secondary | ICD-10-CM | POA: Diagnosis not present

## 2021-08-04 ENCOUNTER — Encounter: Payer: Self-pay | Admitting: Cardiovascular Disease

## 2021-08-04 NOTE — Progress Notes (Unsigned)
Cardiology Office Note:    Date:  08/05/2021   ID:  Timothy [name], Timothy [name] [birth year], MRN [id number]  PCP:  Timothy Infante, MD  Cardiologist:  Timothy Moores, MD    Referring MD: Timothy Infante, MD   Problem list 1. Paroxysmal Atrial fib - with post conversion pauses CHADS2VASC  =  5  ( age 81 , HTN, CAD, TIA  2. History of premature ventricular contractions 3.  HTN 4. Hyperlipidemia 5. Carotid artery disease-status post stenting around 2004 6.  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 ( Creatinine = 2.0 July 2018)  7.  Hx of TIA - 10-12 years ago   Chief Complaint  Patient presents with   Atrial Fibrillation   Hypertension         Previous Notes - Sept. 2018    Timothy [name] is a 81 y.o. male with a hx of PVCs and PACs. Presents now with increase palpitations.  Has been diagnosied with PVCs and PACs in the past   Now, these palpitations are more pronounced.   Has a hx of TIAs in the past  Has classical migraine HA The palpitations last for a few seconds.   Not very severe ,  Is active, does not do any regular organized exercise.  Is able to do his chores and yard work without any issues  Has been on Atenolol since age 57 for palpitations   Oct. 5, 2018:  Timothy [name] is seen with wife, Timothy [name].  Still having some palppitations , last for seconds.  No dizziness, no CP or dyspnea.   30 day monitor showed atrial fib and also show some post conversion pauses.  We have gradually decreased his dose of Atenolol   Echo shows normal LV function with grade 1 diastolic dysfunction , mild TR with mild pulmonary HTN  April 24, 2017: Seen wife , Timothy [name] today   Timothy [name] is seen today for follow-up visit.  He has paroxysmal atrial fibrillation.  He had a treadmill test which showed good chronotropic response but he did have some ST changes.  Follow-up stress Myoview study showed no ST segment changes and showed no evidence of ischemia.  Has some lightheadedness.  No syncope.   Has CKD - Creatine  up to 2.8  ( sees Timothy [name] )  Aldactone  Has been stopped , micarditis has been continued.  Has been exercising a little.  Walks 10 min a day  We discussed needing to get more exercise   Oct. 15, 2020   On Timothy [name] was last seen in March, 2019.  He has a history of paroxysmal atrial fibrillation.  He has had a treadmill test which did not show any ischemia.  He recently had a coronary calcium score of 2546 with extensive three-vessel coronary artery calcification.  He is in the 92nd percentile for age/sex matched controls..  No symptoms of chest pain or dysnea. He has PAF. Was on atenolol for years and had been having "gray out periods"   No further presyncopal epidoses since stopping the atenolol .  Has a hx of CKD but creatinine has improved.  Used to exercise quite a bit,  Limited by covid.   Is staying more active now.  Going hiking .  [place] last weekend.   Going to [place]. Timothy [name] tomorrow.    He is never had any episodes of angina.  He has known chronic kidney disease.  He is had slight improvement of his renal function over the past year.  April 26, 2019:  Timothy [name] is seen today for follow-up visit.  He has a history of paroxysmal atrial fibrillation.  He has a very high coronary calcium score of 2546 which places him in the 92nd percentile for age/gender matched controls.   Has chronic nephritis - followed by Dr. Jimmy [name].   Has had a few CP . Has PAF .   Might last 15 min - all day   We discussed sending him to the Afib clinc if his AF is getting worse . Seems to have PAF every 2-3 weeks.      We discussed options for antiarrhythmics.  He would not be a candidate for flecainide because of his presence of coronary artery disease.  He might be a good candidate for Tikosyn or perhaps amiodarone.  He is having worsening episodes of paroxysmal atrial fibrillation.  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  We found long episodes of heart block during an  event monitor in 2018.  Was on atenolol 100 mg at that time. It is now apparent that even small doses of propranolol are causing similar episodes of heart block.  I worry that we will not be able to treat him with any antiarrhythmic medications and see we have pacemaker in place.  I would like to refer him to our electrophysiology colleagues to get an opinion.  Is on repatha ( managed by Timothy [name])   Total chol is 76 HDL = 31 LDL = 21 Trigs = 118 . Creatinine is 1.5       May 21, 2020: Timothy [name] is seen today for follow up of his coronary artery calcifications and his PAF, hx of carotid stenting .   He was seen by Timothy [name] for assistance in choosing an appropriate antiarrhythmic in the setting of his heart block with beta blockers. Timothy [name] recommended watchful waiting at this time but noted that amiodarone would be the only option.   He would likely need a pacer at some point .  Hx of coronary artery calcification. Able to split a cord of wood yesterday without any difficulty .  Is on repatha ,  Lipids look great .  myoview in 2019 looked ok  He has CKD so we have not pursued cath or coronary CT angio given his lack of symptoms   He is back in Afib today . These will typically last for 30 minutes or so .  No real symptoms - no CP, no dyspnea Has an "anxioius" sensation   Sees Timothy [name] for nephrology ,  Creatinine is around 2.1  Will have Timothy [name] fax over labs.   Has had Prostate cancer , treated with XRT in the past  Sees Timothy [name], who wants to do a PET scan   August 05, 2021 Timothy [name] is seen today for follow up of his CAC, PAF Has CKD  No CP ,  has occasional episodes of Afib.  Better after sitting for a few minutes.   Does not take the propranolol. On the one occasion when he did take a propranolol, he had profound weakness, "graying out"  I suspect he had transient heart block / HR slowing  His PAF typically resolves after about 30 minutes.    Past  Medical History:  Diagnosis Date   Anal fissure    ?   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   Sinus bradycardia     Past Surgical History:  Procedure Laterality Date   CAROTID STENT     ESOPHAGOGASTRODUODENOSCOPY (EGD) WITH PROPOFOL N/A 01/05/2018   Procedure: ESOPHAGOGASTRODUODENOSCOPY (EGD) WITH PROPOFOL;  Surgeon: [name], MD;  Location: [facility];  Service: Endoscopy;  Laterality: N/A;   external beam radiation to prostate  2009   FLEXIBLE SIGMOIDOSCOPY  05/22/2011   Procedure: FLEXIBLE SIGMOIDOSCOPY;  Surgeon: [name], MD;  Location: [facility] ENDOSCOPY;  Service: Endoscopy;  Laterality: N/A;   HOT HEMOSTASIS  05/22/2011   Procedure: HOT HEMOSTASIS (ARGON PLASMA COAGULATION/BICAP);  Surgeon: [name], MD;  Location: [facility];  Service: Endoscopy;  Laterality: N/A;   RADIOLOGY WITH ANESTHESIA N/A 01/04/2018   Procedure: MRI WITH ANESTHESIA/LUMBAR SPINE WITHOUT CONTRAST;  Surgeon: Radiologist, Medication, MD;  Location: [facility];  Service: Radiology;  Laterality: N/A;    Current Medications: Current Meds  Medication Sig   allopurinol (ZYLOPRIM) 300 MG tablet Take 300 mg by mouth daily.    amLODipine (NORVASC) 10 MG tablet Take 10 mg by mouth daily.   apixaban (ELIQUIS) 5 MG TABS tablet Take 10 mg by mouth 2 (two) times daily.   ezetimibe (ZETIA) 10 MG tablet Take 10 mg by mouth daily.   famotidine (PEPCID) 20 MG tablet Take 20 mg by mouth 2 (two) times daily.   furosemide (LASIX)  20 MG tablet Take 20 mg by mouth daily.   nitroGLYCERIN (NITROSTAT) 0.4 MG SL tablet Place 1 tablet (0.4 mg total) under the tongue every 5 (five) minutes as needed for chest pain.   REPATHA SURECLICK 701 MG/ML SOAJ Inject 140 mg into the skin every 14 (fourteen) days.   telmisartan (MICARDIS) 80 MG tablet Take 1 tablet by mouth daily.     Allergies:   Crestor [rosuvastatin calcium], Contrast media [iodinated contrast media], and Lipitor [atorvastatin]   Social History   Socioeconomic History   Marital status: Married    Spouse name: Not on file   Number of children: 1   Years of education: Not on file   Highest education level: Not on file  Occupational History   Occupation: pharmacist   Occupation: Retired  Tobacco Use   Smoking status: Former    Types: Cigarettes   Smokeless tobacco: Never  Scientific laboratory technician Use: Never used  Substance and Sexual Activity   Alcohol use: Yes    Comment: occas   Drug use: No   Sexual activity: Yes  Other Topics Concern   Not on file  Social History Narrative   Not on file   Social Determinants of Health   Financial Resource Strain: Not on file  Food Insecurity: Not on file  Transportation Needs: Not on file  Physical Activity: Not on file  Stress: Not on file  Social Connections: Not on file     Family History: The patient's family history includes Atrial fibrillation in his brother; Brain cancer in his brother. There is no history of Colon cancer, Rectal cancer, Stomach cancer, or Esophageal  cancer. ROS:   Please see the history of present illness.     All other systems reviewed and are negative.  EKGs/Labs/Other Studies Reviewed:    The following studies were reviewed today: Records and labs from Timothy [name]     Recent Labs: No results found for requested labs within last 365 days.  Recent Lipid Panel No results found for: "CHOL", "TRIG", "HDL", "CHOLHDL", "VLDL", "LDLCALC", "LDLDIRECT"    Physical Exam: Blood  pressure 140/62, pulse 64, height 5' 8.5" (1.74 m), weight 159 lb 12.8 oz (72.5 kg), SpO2 97 %.  GEN:  Well nourished, well developed in no acute distress HEENT: Normal NECK: No JVD; No carotid bruits LYMPHATICS: No lymphadenopathy CARDIAC: RRR , frequent PACs.   RESPIRATORY:  Clear to auscultation without rales, wheezing or rhonchi  ABDOMEN: Soft, non-tender, non-distended MUSCULOSKELETAL:  No edema; No deformity  SKIN: Warm and dry NEUROLOGIC:  Alert and oriented x 3   EKG:    August 05, 2021:  Normal sinus rhythm at 64.  Frequent premature atrial contractions.  Nonspecific ST and T wave changes.    ASSESSMENT:    1. PAF (paroxysmal atrial fibrillation) ([place])     PLAN:      1.  Paroxysmal Atrial fib: /Sick sinus syndrome     He has had episodes of worsening heart block with beta blocker therapy. He has been seen by Timothy [name] who advised watchful waiting at this time.   His Afib last for 30 min or so.   Does not take propranolol - had profound weakness ( likely due to bradycardia )    2.  Coronary artery disease: Timothy [name] has an extremely high coronary calcium score.  The calcium distribution is is in all 3 coronary arteries. He is currently on Repatha.  His LDL is in the low 20s.   No angina .    3.  Hyperlipidemia:  managed by Timothy [name]   4. CKD :  follow by [place] [facility]      Medication Adjustments/Labs and Tests Ordered: Current medicines are reviewed at length with the patient today.  Concerns regarding medicines are outlined above.  Orders Placed This Encounter  Procedures   EKG 12-Lead   No orders of the defined types were placed in this encounter.   Signed, Timothy Moores, MD  08/05/2021 1:07 PM    [place]

## 2021-08-05 ENCOUNTER — Encounter: Payer: Self-pay | Admitting: Cardiovascular Disease

## 2021-08-05 ENCOUNTER — Ambulatory Visit (INDEPENDENT_AMBULATORY_CARE_PROVIDER_SITE_OTHER): Payer: Medicare Other | Admitting: Cardiovascular Disease

## 2021-08-05 VITALS — BP 140/62 | HR 64 | Ht 68.5 in | Wt 159.8 lb

## 2021-08-05 DIAGNOSIS — I480 Paroxysmal atrial fibrillation: Secondary | ICD-10-CM | POA: Diagnosis not present

## 2021-08-05 NOTE — Patient Instructions (Signed)
Medication Instructions:  Your physician recommends that you continue on your current medications as directed. Please refer to the Current Medication list given to you today.  *If you need a refill on your cardiac medications before your next appointment, please call your pharmacy*   Lab Work: NONE If you have labs (blood work) drawn today and your tests are completely normal, you will receive your results only by: MyChart Message (if you have MyChart) OR A paper copy in the mail If you have any lab test that is abnormal or we need to change your treatment, we will call you to review the results.   Testing/Procedures: NONE   Follow-Up: At [facility], you and your health needs are our priority.  As part of our continuing mission to provide you with exceptional heart care, we have created designated Provider Care Teams.  These Care Teams include your primary Cardiologist (physician) and Advanced Practice Providers (APPs -  Physician Assistants and Nurse Practitioners) who all work together to provide you with the care you need, when you need it.  Your next appointment:   1 year(s)  The format for your next appointment:   In Person  Provider:   Nahser, [place], or [place] {    Important Information About [place]       

## 2021-08-08 DIAGNOSIS — R946 Abnormal results of thyroid function studies: Secondary | ICD-10-CM | POA: Diagnosis not present

## 2021-08-08 DIAGNOSIS — E785 Hyperlipidemia, unspecified: Secondary | ICD-10-CM | POA: Diagnosis not present

## 2021-08-08 DIAGNOSIS — R7989 Other specified abnormal findings of blood chemistry: Secondary | ICD-10-CM | POA: Diagnosis not present

## 2021-08-08 DIAGNOSIS — R7301 Impaired fasting glucose: Secondary | ICD-10-CM | POA: Diagnosis not present

## 2021-08-20 DIAGNOSIS — D485 Neoplasm of uncertain behavior of skin: Secondary | ICD-10-CM | POA: Diagnosis not present

## 2021-08-20 DIAGNOSIS — D692 Other nonthrombocytopenic purpura: Secondary | ICD-10-CM | POA: Diagnosis not present

## 2021-08-20 DIAGNOSIS — Z85828 Personal history of other malignant neoplasm of skin: Secondary | ICD-10-CM | POA: Diagnosis not present

## 2021-08-20 DIAGNOSIS — D0439 Carcinoma in situ of skin of other parts of face: Secondary | ICD-10-CM | POA: Diagnosis not present

## 2021-08-20 DIAGNOSIS — L570 Actinic keratosis: Secondary | ICD-10-CM | POA: Diagnosis not present

## 2021-08-20 DIAGNOSIS — D044 Carcinoma in situ of skin of scalp and neck: Secondary | ICD-10-CM | POA: Diagnosis not present

## 2021-08-20 DIAGNOSIS — L821 Other seborrheic keratosis: Secondary | ICD-10-CM | POA: Diagnosis not present

## 2021-08-28 DIAGNOSIS — H3500 Unspecified background retinopathy: Secondary | ICD-10-CM | POA: Diagnosis not present

## 2021-08-28 DIAGNOSIS — H25813 Combined forms of age-related cataract, bilateral: Secondary | ICD-10-CM | POA: Diagnosis not present

## 2021-09-05 DIAGNOSIS — D044 Carcinoma in situ of skin of scalp and neck: Secondary | ICD-10-CM | POA: Diagnosis not present

## 2021-09-05 DIAGNOSIS — Z85828 Personal history of other malignant neoplasm of skin: Secondary | ICD-10-CM | POA: Diagnosis not present

## 2021-09-05 DIAGNOSIS — L821 Other seborrheic keratosis: Secondary | ICD-10-CM | POA: Diagnosis not present

## 2021-10-23 DIAGNOSIS — N189 Chronic kidney disease, unspecified: Secondary | ICD-10-CM | POA: Diagnosis not present

## 2021-10-23 DIAGNOSIS — N2581 Secondary hyperparathyroidism of renal origin: Secondary | ICD-10-CM | POA: Diagnosis not present

## 2021-10-23 DIAGNOSIS — N184 Chronic kidney disease, stage 4 (severe): Secondary | ICD-10-CM | POA: Diagnosis not present

## 2021-10-23 DIAGNOSIS — C61 Malignant neoplasm of prostate: Secondary | ICD-10-CM | POA: Diagnosis not present

## 2021-10-23 DIAGNOSIS — I129 Hypertensive chronic kidney disease with stage 1 through stage 4 chronic kidney disease, or unspecified chronic kidney disease: Secondary | ICD-10-CM | POA: Diagnosis not present

## 2021-10-23 DIAGNOSIS — D631 Anemia in chronic kidney disease: Secondary | ICD-10-CM | POA: Diagnosis not present

## 2021-10-23 DIAGNOSIS — M109 Gout, unspecified: Secondary | ICD-10-CM | POA: Diagnosis not present

## 2021-10-24 LAB — LAB REPORT - SCANNED: EGFR: 48

## 2021-10-25 LAB — LAB REPORT - SCANNED: Creatinine, POC: 129.5 mg/dL

## 2021-11-01 DIAGNOSIS — Z23 Encounter for immunization: Secondary | ICD-10-CM | POA: Diagnosis not present

## 2021-11-26 DIAGNOSIS — Z23 Encounter for immunization: Secondary | ICD-10-CM | POA: Diagnosis not present

## 2022-02-26 DIAGNOSIS — R7301 Impaired fasting glucose: Secondary | ICD-10-CM | POA: Diagnosis not present

## 2022-02-26 DIAGNOSIS — R946 Abnormal results of thyroid function studies: Secondary | ICD-10-CM | POA: Diagnosis not present

## 2022-02-26 DIAGNOSIS — I10 Essential (primary) hypertension: Secondary | ICD-10-CM | POA: Diagnosis not present

## 2022-02-26 DIAGNOSIS — Z1211 Encounter for screening for malignant neoplasm of colon: Secondary | ICD-10-CM | POA: Diagnosis not present

## 2022-02-26 DIAGNOSIS — H25813 Combined forms of age-related cataract, bilateral: Secondary | ICD-10-CM | POA: Diagnosis not present

## 2022-02-26 DIAGNOSIS — M109 Gout, unspecified: Secondary | ICD-10-CM | POA: Diagnosis not present

## 2022-02-26 DIAGNOSIS — Z125 Encounter for screening for malignant neoplasm of prostate: Secondary | ICD-10-CM | POA: Diagnosis not present

## 2022-02-26 DIAGNOSIS — E785 Hyperlipidemia, unspecified: Secondary | ICD-10-CM | POA: Diagnosis not present

## 2022-02-26 LAB — LAB REPORT - SCANNED: EGFR: 39.1

## 2022-03-04 DIAGNOSIS — R82998 Other abnormal findings in urine: Secondary | ICD-10-CM | POA: Diagnosis not present

## 2022-03-04 DIAGNOSIS — Z0000 Encounter for general adult medical examination without abnormal findings: Secondary | ICD-10-CM | POA: Diagnosis not present

## 2022-03-04 DIAGNOSIS — Z1339 Encounter for screening examination for other mental health and behavioral disorders: Secondary | ICD-10-CM | POA: Diagnosis not present

## 2022-03-04 DIAGNOSIS — I480 Paroxysmal atrial fibrillation: Secondary | ICD-10-CM | POA: Diagnosis not present

## 2022-03-04 DIAGNOSIS — Z7901 Long term (current) use of anticoagulants: Secondary | ICD-10-CM | POA: Diagnosis not present

## 2022-03-04 DIAGNOSIS — I2510 Atherosclerotic heart disease of native coronary artery without angina pectoris: Secondary | ICD-10-CM | POA: Diagnosis not present

## 2022-03-04 DIAGNOSIS — N184 Chronic kidney disease, stage 4 (severe): Secondary | ICD-10-CM | POA: Diagnosis not present

## 2022-03-04 DIAGNOSIS — I10 Essential (primary) hypertension: Secondary | ICD-10-CM | POA: Diagnosis not present

## 2022-03-04 DIAGNOSIS — Z1331 Encounter for screening for depression: Secondary | ICD-10-CM | POA: Diagnosis not present

## 2022-03-04 DIAGNOSIS — C61 Malignant neoplasm of prostate: Secondary | ICD-10-CM | POA: Diagnosis not present

## 2022-03-04 DIAGNOSIS — R7301 Impaired fasting glucose: Secondary | ICD-10-CM | POA: Diagnosis not present

## 2022-03-04 DIAGNOSIS — Z95828 Presence of other vascular implants and grafts: Secondary | ICD-10-CM | POA: Diagnosis not present

## 2022-07-18 DIAGNOSIS — M79672 Pain in left foot: Secondary | ICD-10-CM | POA: Diagnosis not present

## 2022-07-18 DIAGNOSIS — M109 Gout, unspecified: Secondary | ICD-10-CM | POA: Diagnosis not present

## 2022-07-18 DIAGNOSIS — M7732 Calcaneal spur, left foot: Secondary | ICD-10-CM | POA: Diagnosis not present

## 2022-07-18 DIAGNOSIS — I480 Paroxysmal atrial fibrillation: Secondary | ICD-10-CM | POA: Diagnosis not present

## 2022-07-22 DIAGNOSIS — Z85828 Personal history of other malignant neoplasm of skin: Secondary | ICD-10-CM | POA: Diagnosis not present

## 2022-07-22 DIAGNOSIS — L905 Scar conditions and fibrosis of skin: Secondary | ICD-10-CM | POA: Diagnosis not present

## 2022-07-22 DIAGNOSIS — L570 Actinic keratosis: Secondary | ICD-10-CM | POA: Diagnosis not present

## 2022-07-28 ENCOUNTER — Encounter: Payer: Self-pay | Admitting: Podiatry

## 2022-07-28 ENCOUNTER — Ambulatory Visit (INDEPENDENT_AMBULATORY_CARE_PROVIDER_SITE_OTHER): Payer: Medicare Other | Admitting: Podiatry

## 2022-07-28 VITALS — Ht 69.0 in

## 2022-07-28 DIAGNOSIS — M722 Plantar fascial fibromatosis: Secondary | ICD-10-CM | POA: Diagnosis not present

## 2022-07-28 MED ORDER — TRIAMCINOLONE ACETONIDE 10 MG/ML IJ SUSP
10.0000 mg | Freq: Once | INTRAMUSCULAR | Status: AC
  Administered 2022-07-28: 10 mg

## 2022-07-28 MED ORDER — DICLOFENAC SODIUM 75 MG PO TBEC: 75.0000 mg | DELAYED_RELEASE_TABLET | Freq: Two times a day (BID) | ORAL | 2 refills | Status: DC

## 2022-07-28 NOTE — Patient Instructions (Signed)

## 2022-07-28 NOTE — Progress Notes (Signed)
Subjective:   Patient ID: Timothy [name], male   DOB: 82 y.o.   MRN: [id number]   HPI Patient presents with intense discomfort plantar aspect left heel that has been present for a number of weeks and he does not remember specific injury.  Patient does not smoke tries to be active   Review of Systems  All other systems reviewed and are negative.       Objective:  Physical Exam Vitals and nursing note reviewed.  Constitutional:      Appearance: He is well-developed.  Pulmonary:     Effort: Pulmonary effort is normal.  Musculoskeletal:        General: Normal range of motion.  Skin:    General: Skin is warm.  Neurological:     Mental Status: He is alert.     Neurovascular status intact muscle strength found to be adequate range of motion within normal limits with exquisite discomfort medial fascial band left at the insertional point tendon calcaneus with fluid buildup around the area.  Good digital perfusion well-oriented x 3     Assessment:  Acute Planter fasciitis left inflammation fluid around the medial band     Plan:  H&P reviewed and discussed x-rays also.  Sterile prep injected the plantar fascia left 3 mg Kenalog 5 mg Xylocaine applied fascial brace gave instructions for physical therapy and shoe gear modifications and reappoint to recheck 1 week  X-rays indicate spur no indication stress fracture arthritis

## 2022-08-04 ENCOUNTER — Ambulatory Visit (INDEPENDENT_AMBULATORY_CARE_PROVIDER_SITE_OTHER): Payer: Medicare Other | Admitting: Podiatry

## 2022-08-04 ENCOUNTER — Encounter: Payer: Self-pay | Admitting: Podiatry

## 2022-08-04 DIAGNOSIS — M722 Plantar fascial fibromatosis: Secondary | ICD-10-CM

## 2022-08-04 NOTE — Progress Notes (Signed)
Subjective:   Patient ID: Timothy [name], male   DOB: 82 y.o.   MRN: [id number]   HPI Patient presents stating the heel is feeling much better still having some discomfort but improved   ROS      Objective:  Physical Exam  Neurovascular status intact significant diminishment of discomfort in the plantar heel left at the insertion of the tendon calcaneus     Assessment:  Significant improvement Planter fasciitis left with previous treatment     Plan:  H&P reviewed advised on supportive shoes stretching exercises ice therapy and patient will be seen back to recheck as needed

## 2022-08-21 ENCOUNTER — Telehealth: Payer: Self-pay | Admitting: Podiatry

## 2022-08-21 NOTE — Telephone Encounter (Signed)
Left message for pt that when pt scheduled his appt thru mychart it scheduled pt as a new patient in a new pt appt time. I have changed the appt time to 215 and to call if this did not work for pt.

## 2022-08-24 NOTE — Progress Notes (Unsigned)
Cardiology Office Note:    Date:  08/25/2022  ID:  Timothy, [name] Dec 06, 1940, MRN [id number] PCP: Rodrigo Ran, MD   [facility] Providers Cardiologist:  Kristeen Miss, MD       Patient Profile:      Coronary artery disease  Myoview 04/10/17: EF 65, normal perfusion, low risk CAC score 12/01/2018: 3v Ca2+, 2646 (92nd percentile) Paroxsymal atrial fibrillation  Post conversion pauses >> no beta-blocker  Eval by EP in past (Dr. 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 male who returns for follow up of AFib, CAD. He was last seen by Dr. Elease Hashimoto in 07/2021. He is here alone. He has been doing well. He still works on his farm. He has not had chest pain, shortness of breath, syncope, orthopnea, edema. He has occasional episodes of atrial fibrillation that last a few minutes to 2 hours. It is never any longer. He usually has resolution after sitting down to rest. He has not had a change in his pattern of atrial fibrillation.   Review of Systems  Gastrointestinal:  Negative for hematochezia and melena.  Genitourinary:  Negative for hematuria.   See HPI    Studies Reviewed:   EKG Interpretation Date/Time:  Monday August 25 2022 10:55:31 EDT Ventricular Rate:  58 PR Interval:  164 QRS Duration:  98 QT Interval:  380 QTC Calculation: 373 R Axis:   27  Text Interpretation: Sinus bradycardia with occasional Premature ventricular complexes and Premature atrial complexes Normal axis Minimal voltage criteria for LVH, may be normal variant ( Cornell product ) Nonspecific ST and T wave  abnormality No significant change when compared to ECG 08/05/21 Confirmed by Tereso Newcomer [phone] on 08/25/2022 11:11:39 AM    Risk Assessment/Calculations:    CHA2DS2-VASc Score = 6   This indicates a 9.7% annual risk of stroke. The patient's score is based upon: CHF History: 0 HTN History: 1 Diabetes History: 0 Stroke History: 2 Vascular Disease History: 1 Age Score: 2 Gender Score: 0            Physical Exam:   VS:  BP (!) 134/50   Pulse (!) 58   Ht 5\' 9"  (1.753 m)   Wt 151 lb 3.2 oz (68.6 kg)   SpO2 97%   BMI 22.33 kg/m    Wt Readings from Last 3 Encounters:  08/25/22 151 lb 3.2 oz (68.6 kg)  08/05/21 159 lb 12.8 oz (72.5 kg)  06/07/21 161 lb 6 oz (73.2 kg)    Constitutional:      Appearance: Healthy appearance. Not in distress.  Neck:     Vascular: No carotid bruit. JVD normal.  Pulmonary:     Breath sounds: Normal breath sounds. No wheezing. No rales.  Cardiovascular:     Normal rate. Regular rhythm.     Murmurs: There is a grade 1/6 systolic murmur at the URSB.  Edema:    Peripheral edema absent.  Abdominal:     Palpations: Abdomen is soft.        ASSESSMENT AND PLAN:   PAF (paroxysmal  atrial fibrillation) (HCC) He has occasional episodes of atrial fibrillation.  Overall pattern is unchanged.  It may last a few minutes up to a couple of hours.  It is never any longer than this.  He does have a history of postconversion pauses.  He has not had any syncopal or near syncopal episodes.  He is no longer on beta-blocker therapy.  He turns 82 in a few days.  He is followed by nephrology.  He notes creatinines in the 1.6-1.9 range for quite some time.  I will obtain labs from primary care and nephrology.  If his pattern is consistently above 1.5, I will decrease his Eliquis to 2.5 mg twice daily once he has turned 82 years old.  For now, continue Eliquis 5 mg twice daily.  Follow-up in year.  CAD (coronary artery disease) Calcium score 2646 by CT scan in 2020.  He  remains very active.  He has not had chest pain to suggest angina.  He is not on antiplatelet therapy as he is on Eliquis.  Continue Repatha 140 mg every 14 days, Zetia 10 mg daily.  HTN (hypertension) Blood pressure is controlled.  Continue amlodipine 10 mg daily, telmisartan 80 mg daily.  HLD (hyperlipidemia) Labs from primary care reviewed.  In January 2024, his LDL was excellent at 15.  Continue Repatha 140 mg every 14 days, Zetia 10 mg daily.  Murmur Exam is notable for systolic murmur.  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  Echo in 2018 demonstrated trivial MR.  Obtain follow up echocardiogram.  CKD (chronic kidney disease) stage 3, GFR 30-59 ml/min (HCC) Received labs from PCP and Neph. SCr in 09/2021 was 1.48 and in 02/2022 it was 1.7. I will obtain a follow up BMET at his appt for the echocardiogram to determine if his SCr is trending above 1.5 before we decide to reduce his dose of Eliquis.    Dispo:  Return in about 1 year (around 08/25/2023) for Routine Follow Up w/ Dr. Elease Hashimoto.  Signed, Tereso Newcomer, PA-C

## 2022-08-25 ENCOUNTER — Encounter: Payer: Self-pay | Admitting: Cardiovascular Disease

## 2022-08-25 ENCOUNTER — Ambulatory Visit: Payer: Medicare Other | Attending: Physician Assistant | Admitting: Physician Assistant

## 2022-08-25 ENCOUNTER — Encounter: Payer: Self-pay | Admitting: Physician Assistant

## 2022-08-25 VITALS — BP 134/50 | HR 58 | Ht 69.0 in | Wt 151.2 lb

## 2022-08-25 DIAGNOSIS — E7800 Pure hypercholesterolemia, unspecified: Secondary | ICD-10-CM

## 2022-08-25 DIAGNOSIS — R011 Cardiac murmur, unspecified: Secondary | ICD-10-CM | POA: Diagnosis not present

## 2022-08-25 DIAGNOSIS — I2510 Atherosclerotic heart disease of native coronary artery without angina pectoris: Secondary | ICD-10-CM | POA: Diagnosis not present

## 2022-08-25 DIAGNOSIS — I480 Paroxysmal atrial fibrillation: Secondary | ICD-10-CM | POA: Diagnosis not present

## 2022-08-25 DIAGNOSIS — I340 Nonrheumatic mitral (valve) insufficiency: Secondary | ICD-10-CM

## 2022-08-25 DIAGNOSIS — I10 Essential (primary) hypertension: Secondary | ICD-10-CM

## 2022-08-25 DIAGNOSIS — N1831 Chronic kidney disease, stage 3a: Secondary | ICD-10-CM | POA: Diagnosis not present

## 2022-08-25 NOTE — Patient Instructions (Addendum)
Medication Instructions:  Your physician recommends that you continue on your current medications as directed. Please refer to the Current Medication list given to you today.  *If you need a refill on your cardiac medications before your next appointment, please call your pharmacy*   Lab Work: None ordered  If you have labs (blood work) drawn today and your tests are completely normal, you will receive your results only by: MyChart Message (if you have MyChart) OR A paper copy in the mail If you have any lab test that is abnormal or we need to change your treatment, we will call you to review the results.   Testing/Procedures: Your physician has requested that you have an echocardiogram. Echocardiography is a painless test that uses sound waves to create images of your heart. It provides your doctor with information about the size and shape of your heart and how well your heart's chambers and valves are working. This procedure takes approximately one hour. There are no restrictions for this procedure. Please do NOT wear cologne, perfume, aftershave, or lotions (deodorant is allowed). Please arrive 15 minutes prior to your appointment time.    Follow-Up: At [facility], you and your health needs are our priority.  As part of our continuing mission to provide you with exceptional heart care, we have created designated Provider Care Teams.  These Care Teams include your primary Cardiologist (physician) and Advanced Practice Providers (APPs -  Physician Assistants and Nurse Practitioners) who all work together to provide you with the care you need, when you need it.  We recommend signing up for the patient portal called "MyChart".  Sign up information is provided on this After Visit Summary.  MyChart is used to connect with patients for Virtual Visits (Telemedicine).  Patients are able to view lab/test results, encounter notes, upcoming appointments, etc.  Non-urgent messages can be  sent to your provider as well.   To learn more about what you can do with MyChart, go to [email or web address].    Your next appointment:   12 month(s)  Provider:   Kristeen Miss, MD  or Tereso Newcomer, PA-C         Other Instructions

## 2022-08-25 NOTE — Assessment & Plan Note (Signed)
Calcium score 2646 by CT scan in 2020.  He remains very active.  He has not had chest pain to suggest angina.  He is not on antiplatelet therapy as he is on Eliquis.  Continue Repatha 140 mg every 14 days, Zetia 10 mg daily.

## 2022-08-25 NOTE — Assessment & Plan Note (Signed)
Labs from primary care reviewed.  In January 2024, his LDL was excellent at 15.  Continue Repatha 140 mg every 14 days, Zetia 10 mg daily.

## 2022-08-25 NOTE — Assessment & Plan Note (Signed)
Blood pressure is controlled.  Continue amlodipine 10 mg daily, telmisartan 80 mg daily.

## 2022-08-25 NOTE — Assessment & Plan Note (Signed)
Exam is notable for systolic murmur.  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  Echo in 2018 demonstrated trivial MR.  Obtain follow up echocardiogram.

## 2022-08-25 NOTE — Assessment & Plan Note (Signed)
He has occasional episodes of atrial fibrillation.  Overall pattern is unchanged.  It may last a few minutes up to a couple of hours.  It is never any longer than this.  He does have a history of postconversion pauses.  He has not had any syncopal or near syncopal episodes.  He is no longer on beta-blocker therapy.  He turns 82 in a few days.  He is followed by nephrology.  He notes creatinines in the 1.6-1.9 range for quite some time.  I will obtain labs from primary care and nephrology.  If his pattern is consistently above 1.5, I will decrease his Eliquis to 2.5 mg twice daily once he has turned 82 years old.  For now, continue Eliquis 5 mg twice daily.  Follow-up in year.

## 2022-08-25 NOTE — Assessment & Plan Note (Signed)
Received labs from PCP and Neph. SCr in 09/2021 was 1.48 and in 02/2022 it was 1.7. I will obtain a follow up BMET at his appt for the echocardiogram to determine if his SCr is trending above 1.5 before we decide to reduce his dose of Eliquis.

## 2022-08-26 DIAGNOSIS — L821 Other seborrheic keratosis: Secondary | ICD-10-CM | POA: Diagnosis not present

## 2022-08-26 DIAGNOSIS — D2271 Melanocytic nevi of right lower limb, including hip: Secondary | ICD-10-CM | POA: Diagnosis not present

## 2022-08-26 DIAGNOSIS — Z85828 Personal history of other malignant neoplasm of skin: Secondary | ICD-10-CM | POA: Diagnosis not present

## 2022-08-26 DIAGNOSIS — D692 Other nonthrombocytopenic purpura: Secondary | ICD-10-CM | POA: Diagnosis not present

## 2022-08-26 DIAGNOSIS — B0089 Other herpesviral infection: Secondary | ICD-10-CM | POA: Diagnosis not present

## 2022-08-26 DIAGNOSIS — L905 Scar conditions and fibrosis of skin: Secondary | ICD-10-CM | POA: Diagnosis not present

## 2022-09-01 ENCOUNTER — Ambulatory Visit (INDEPENDENT_AMBULATORY_CARE_PROVIDER_SITE_OTHER): Payer: Medicare Other | Admitting: Podiatry

## 2022-09-01 ENCOUNTER — Encounter: Payer: Self-pay | Admitting: Podiatry

## 2022-09-01 DIAGNOSIS — M722 Plantar fascial fibromatosis: Secondary | ICD-10-CM

## 2022-09-01 MED ORDER — TRIAMCINOLONE ACETONIDE 10 MG/ML IJ SUSP
10.00 mg | Freq: Once | INTRAMUSCULAR | Status: AC
Start: 2022-09-01 — End: 2022-09-01
  Administered 2022-09-01: 10 mg

## 2022-09-01 NOTE — Progress Notes (Signed)
Subjective:   Patient ID: Timothy [name], male   DOB: 82 y.o.   MRN: [id number]   HPI Patient states he is trying to be very active and still is getting pain in the bilateral of his heel especially with increased activity as he likes to walk 10-15,000 steps a day.  Pain can be worse also when he gets up on it   ROS      Objective:  Physical Exam  Neurovascular status intact inflammation of the plantar heel left that is improved but still present with a tight plantar fascia     Assessment:  Continuation Planter fasciitis left moderate improvement of the acute element of this     Plan:  Today sterile prep and injected the plantar fascia at insertion 3 mg Kenalog 5 mg Xylocaine and dispensed night splint with all instructions on usage and also ice therapy.  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

## 2022-09-09 DIAGNOSIS — H35032 Hypertensive retinopathy, left eye: Secondary | ICD-10-CM | POA: Diagnosis not present

## 2022-09-09 DIAGNOSIS — H5213 Myopia, bilateral: Secondary | ICD-10-CM | POA: Diagnosis not present

## 2022-09-09 DIAGNOSIS — H52203 Unspecified astigmatism, bilateral: Secondary | ICD-10-CM | POA: Diagnosis not present

## 2022-09-09 DIAGNOSIS — H3500 Unspecified background retinopathy: Secondary | ICD-10-CM | POA: Diagnosis not present

## 2022-09-23 DIAGNOSIS — Z7901 Long term (current) use of anticoagulants: Secondary | ICD-10-CM | POA: Diagnosis not present

## 2022-09-23 DIAGNOSIS — E785 Hyperlipidemia, unspecified: Secondary | ICD-10-CM | POA: Diagnosis not present

## 2022-09-23 DIAGNOSIS — C61 Malignant neoplasm of prostate: Secondary | ICD-10-CM | POA: Diagnosis not present

## 2022-09-23 DIAGNOSIS — R7301 Impaired fasting glucose: Secondary | ICD-10-CM | POA: Diagnosis not present

## 2022-09-23 DIAGNOSIS — I739 Peripheral vascular disease, unspecified: Secondary | ICD-10-CM | POA: Diagnosis not present

## 2022-09-23 DIAGNOSIS — D6869 Other thrombophilia: Secondary | ICD-10-CM | POA: Diagnosis not present

## 2022-09-23 DIAGNOSIS — I129 Hypertensive chronic kidney disease with stage 1 through stage 4 chronic kidney disease, or unspecified chronic kidney disease: Secondary | ICD-10-CM | POA: Diagnosis not present

## 2022-09-23 DIAGNOSIS — I480 Paroxysmal atrial fibrillation: Secondary | ICD-10-CM | POA: Diagnosis not present

## 2022-09-23 DIAGNOSIS — I2510 Atherosclerotic heart disease of native coronary artery without angina pectoris: Secondary | ICD-10-CM | POA: Diagnosis not present

## 2022-09-23 DIAGNOSIS — N184 Chronic kidney disease, stage 4 (severe): Secondary | ICD-10-CM | POA: Diagnosis not present

## 2022-09-23 DIAGNOSIS — M79672 Pain in left foot: Secondary | ICD-10-CM | POA: Diagnosis not present

## 2022-09-23 DIAGNOSIS — D696 Thrombocytopenia, unspecified: Secondary | ICD-10-CM | POA: Diagnosis not present

## 2022-09-24 DIAGNOSIS — I129 Hypertensive chronic kidney disease with stage 1 through stage 4 chronic kidney disease, or unspecified chronic kidney disease: Secondary | ICD-10-CM | POA: Diagnosis not present

## 2022-09-24 DIAGNOSIS — N2581 Secondary hyperparathyroidism of renal origin: Secondary | ICD-10-CM | POA: Diagnosis not present

## 2022-09-24 DIAGNOSIS — N189 Chronic kidney disease, unspecified: Secondary | ICD-10-CM | POA: Diagnosis not present

## 2022-09-24 DIAGNOSIS — D631 Anemia in chronic kidney disease: Secondary | ICD-10-CM | POA: Diagnosis not present

## 2022-09-24 DIAGNOSIS — I4891 Unspecified atrial fibrillation: Secondary | ICD-10-CM | POA: Diagnosis not present

## 2022-09-24 DIAGNOSIS — N184 Chronic kidney disease, stage 4 (severe): Secondary | ICD-10-CM | POA: Diagnosis not present

## 2022-09-24 DIAGNOSIS — M109 Gout, unspecified: Secondary | ICD-10-CM | POA: Diagnosis not present

## 2022-09-25 LAB — LAB REPORT - SCANNED
Creatinine, POC: 170.7 mg/dL
EGFR: 40

## 2022-09-29 ENCOUNTER — Ambulatory Visit: Payer: Medicare Other | Admitting: Podiatry

## 2022-10-02 ENCOUNTER — Telehealth (HOSPITAL_COMMUNITY): Payer: Self-pay | Admitting: Physician Assistant

## 2022-10-02 ENCOUNTER — Ambulatory Visit (HOSPITAL_COMMUNITY): Payer: Medicare Other

## 2022-10-02 ENCOUNTER — Ambulatory Visit: Payer: Medicare Other

## 2022-10-02 DIAGNOSIS — I340 Nonrheumatic mitral (valve) insufficiency: Secondary | ICD-10-CM

## 2022-10-02 DIAGNOSIS — R011 Cardiac murmur, unspecified: Secondary | ICD-10-CM

## 2022-10-02 DIAGNOSIS — I480 Paroxysmal atrial fibrillation: Secondary | ICD-10-CM

## 2022-10-02 NOTE — Telephone Encounter (Signed)
Patient cancelled echo for 10/02/22 for reason below:  10/02/2022 8:22 AM [name], [name]  Cancel Rsn: Patient (wife called to cx, pt not feeling well, will c/b to r/s)   Order will be removed from the active echo wq. If patient calls back to reschedule we will reinstate the orders. Thank you.

## 2022-10-06 ENCOUNTER — Ambulatory Visit: Payer: Medicare Other | Admitting: Podiatry

## 2022-10-08 ENCOUNTER — Encounter: Payer: Self-pay | Admitting: Podiatry

## 2022-10-08 ENCOUNTER — Ambulatory Visit: Payer: Medicare Other | Admitting: Podiatry

## 2022-10-08 DIAGNOSIS — M722 Plantar fascial fibromatosis: Secondary | ICD-10-CM | POA: Diagnosis not present

## 2022-10-08 DIAGNOSIS — M109 Gout, unspecified: Secondary | ICD-10-CM | POA: Diagnosis not present

## 2022-10-08 DIAGNOSIS — B351 Tinea unguium: Secondary | ICD-10-CM | POA: Diagnosis not present

## 2022-10-08 NOTE — Progress Notes (Signed)
Subjective:   Patient ID: Timothy [name], male   DOB: 81 y.o.   MRN: [id number]   HPI Patient states his heel has improved but he still not yet back to his normal walking routine   ROS      Objective:  Physical Exam  Neurovascular status intact inflammation of the heel left minimal with only minimal swelling noted     Assessment:  History of chronic fasciitis left overall doing well but still does have some stress on the area     Plan:  Reviewed continue night splint usage ice therapy stretching and shoe gear modifications.  Resume normal activities if symptoms come back will be seen back.  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  If symptoms pick up I want to see him for that problem again also which I reviewed

## 2022-10-09 NOTE — Telephone Encounter (Signed)
Patient called to get orders reinstated for him to have his echocardiogram test.

## 2022-10-09 NOTE — Telephone Encounter (Signed)
Patient would like to reschedule echo. Order placed, scheduler to call patient to schedule date/time.  Patient verbalized understanding and expressed appreciation for follow-up.

## 2022-10-09 NOTE — Addendum Note (Signed)
Addended by: Franchot Gallo on: 10/09/2022 12:29 PM   Modules accepted: Orders

## 2022-10-22 DIAGNOSIS — Z23 Encounter for immunization: Secondary | ICD-10-CM | POA: Diagnosis not present

## 2022-10-31 ENCOUNTER — Encounter: Payer: Self-pay | Admitting: Physician Assistant

## 2022-10-31 ENCOUNTER — Ambulatory Visit (HOSPITAL_COMMUNITY): Payer: Medicare Other | Attending: Physician Assistant

## 2022-10-31 DIAGNOSIS — R011 Cardiac murmur, unspecified: Secondary | ICD-10-CM | POA: Insufficient documentation

## 2022-10-31 DIAGNOSIS — I340 Nonrheumatic mitral (valve) insufficiency: Secondary | ICD-10-CM | POA: Diagnosis not present

## 2022-10-31 DIAGNOSIS — I480 Paroxysmal atrial fibrillation: Secondary | ICD-10-CM | POA: Insufficient documentation

## 2022-10-31 DIAGNOSIS — I351 Nonrheumatic aortic (valve) insufficiency: Secondary | ICD-10-CM | POA: Insufficient documentation

## 2022-10-31 HISTORY — DX: Nonrheumatic aortic (valve) insufficiency: I35.1

## 2022-10-31 LAB — ECHOCARDIOGRAM COMPLETE
Area-P 1/2: 3.96 cm2
S' Lateral: 2.6 cm

## 2022-11-10 ENCOUNTER — Ambulatory Visit: Payer: Medicare Other | Admitting: Podiatry

## 2022-11-19 ENCOUNTER — Ambulatory Visit (INDEPENDENT_AMBULATORY_CARE_PROVIDER_SITE_OTHER): Payer: Medicare Other | Admitting: Podiatry

## 2022-11-19 ENCOUNTER — Encounter: Payer: Self-pay | Admitting: Podiatry

## 2022-11-19 VITALS — BP 167/57 | HR 48

## 2022-11-19 DIAGNOSIS — M722 Plantar fascial fibromatosis: Secondary | ICD-10-CM

## 2022-11-19 NOTE — Progress Notes (Signed)
Subjective:   Patient ID: Timothy [name], male   DOB: 82 y.o.   MRN: [id number]   HPI Patient presents stating that his left heel continues to bother him and he is not sure as to the extent.  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 Allena Katz.  Today I went ahead and applied air fracture walker to completely immobilize we are going to put him on 3 weeks of an oral diclofenac and I want to see him back and decide what might be a more palatable approach if it is not improving

## 2022-12-17 ENCOUNTER — Encounter: Payer: Self-pay | Admitting: Podiatry

## 2022-12-17 ENCOUNTER — Ambulatory Visit (INDEPENDENT_AMBULATORY_CARE_PROVIDER_SITE_OTHER): Payer: Medicare Other | Admitting: Podiatry

## 2022-12-17 VITALS — Ht 69.0 in | Wt 152.0 lb

## 2022-12-17 DIAGNOSIS — M722 Plantar fascial fibromatosis: Secondary | ICD-10-CM | POA: Diagnosis not present

## 2022-12-17 DIAGNOSIS — M205X2 Other deformities of toe(s) (acquired), left foot: Secondary | ICD-10-CM

## 2022-12-17 NOTE — Progress Notes (Signed)
Subjective:   Patient ID: Timothy [name], male   DOB: 82 y.o.   MRN: [id number]   HPI Patient states he is improving quite a bit the boot has been helpful so far and he also has some problems with his big toe joint   ROS      Objective:  Physical Exam  Neuro vascular status intact discomfort left plantar heel there is moderate it still painful when pressed deeply but overall not much better with pain also noted in the big toe joint left.     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  I then discussed hallux limitus may require periarticular injection possible surgery if symptoms get worse may require PRP injection for the heel all conditions discussed at length

## 2023-01-28 DIAGNOSIS — M109 Gout, unspecified: Secondary | ICD-10-CM | POA: Diagnosis not present

## 2023-01-28 DIAGNOSIS — Z7901 Long term (current) use of anticoagulants: Secondary | ICD-10-CM | POA: Diagnosis not present

## 2023-01-28 DIAGNOSIS — D649 Anemia, unspecified: Secondary | ICD-10-CM | POA: Diagnosis not present

## 2023-01-28 DIAGNOSIS — C61 Malignant neoplasm of prostate: Secondary | ICD-10-CM | POA: Diagnosis not present

## 2023-01-28 DIAGNOSIS — N2581 Secondary hyperparathyroidism of renal origin: Secondary | ICD-10-CM | POA: Diagnosis not present

## 2023-01-28 DIAGNOSIS — I129 Hypertensive chronic kidney disease with stage 1 through stage 4 chronic kidney disease, or unspecified chronic kidney disease: Secondary | ICD-10-CM | POA: Diagnosis not present

## 2023-01-28 DIAGNOSIS — N184 Chronic kidney disease, stage 4 (severe): Secondary | ICD-10-CM | POA: Diagnosis not present

## 2023-01-28 DIAGNOSIS — E785 Hyperlipidemia, unspecified: Secondary | ICD-10-CM | POA: Diagnosis not present

## 2023-03-09 DIAGNOSIS — I10 Essential (primary) hypertension: Secondary | ICD-10-CM | POA: Diagnosis not present

## 2023-03-09 DIAGNOSIS — I480 Paroxysmal atrial fibrillation: Secondary | ICD-10-CM | POA: Diagnosis not present

## 2023-03-09 DIAGNOSIS — R7301 Impaired fasting glucose: Secondary | ICD-10-CM | POA: Diagnosis not present

## 2023-03-09 DIAGNOSIS — E785 Hyperlipidemia, unspecified: Secondary | ICD-10-CM | POA: Diagnosis not present

## 2023-03-09 DIAGNOSIS — R946 Abnormal results of thyroid function studies: Secondary | ICD-10-CM | POA: Diagnosis not present

## 2023-03-09 DIAGNOSIS — M109 Gout, unspecified: Secondary | ICD-10-CM | POA: Diagnosis not present

## 2023-03-09 DIAGNOSIS — C61 Malignant neoplasm of prostate: Secondary | ICD-10-CM | POA: Diagnosis not present

## 2023-03-16 DIAGNOSIS — Z7901 Long term (current) use of anticoagulants: Secondary | ICD-10-CM | POA: Diagnosis not present

## 2023-03-16 DIAGNOSIS — I493 Ventricular premature depolarization: Secondary | ICD-10-CM | POA: Diagnosis not present

## 2023-03-16 DIAGNOSIS — R82998 Other abnormal findings in urine: Secondary | ICD-10-CM | POA: Diagnosis not present

## 2023-03-16 DIAGNOSIS — N184 Chronic kidney disease, stage 4 (severe): Secondary | ICD-10-CM | POA: Diagnosis not present

## 2023-03-16 DIAGNOSIS — J302 Other seasonal allergic rhinitis: Secondary | ICD-10-CM | POA: Diagnosis not present

## 2023-03-16 DIAGNOSIS — I129 Hypertensive chronic kidney disease with stage 1 through stage 4 chronic kidney disease, or unspecified chronic kidney disease: Secondary | ICD-10-CM | POA: Diagnosis not present

## 2023-03-16 DIAGNOSIS — E785 Hyperlipidemia, unspecified: Secondary | ICD-10-CM | POA: Diagnosis not present

## 2023-03-16 DIAGNOSIS — D696 Thrombocytopenia, unspecified: Secondary | ICD-10-CM | POA: Diagnosis not present

## 2023-03-16 DIAGNOSIS — D6869 Other thrombophilia: Secondary | ICD-10-CM | POA: Diagnosis not present

## 2023-03-16 DIAGNOSIS — Z95828 Presence of other vascular implants and grafts: Secondary | ICD-10-CM | POA: Diagnosis not present

## 2023-03-16 DIAGNOSIS — I480 Paroxysmal atrial fibrillation: Secondary | ICD-10-CM | POA: Diagnosis not present

## 2023-03-16 DIAGNOSIS — C61 Malignant neoplasm of prostate: Secondary | ICD-10-CM | POA: Diagnosis not present

## 2023-03-16 DIAGNOSIS — I10 Essential (primary) hypertension: Secondary | ICD-10-CM | POA: Diagnosis not present

## 2023-03-16 DIAGNOSIS — Z0000 Encounter for general adult medical examination without abnormal findings: Secondary | ICD-10-CM | POA: Diagnosis not present

## 2023-03-28 DIAGNOSIS — Z1211 Encounter for screening for malignant neoplasm of colon: Secondary | ICD-10-CM | POA: Diagnosis not present

## 2023-06-09 DIAGNOSIS — I129 Hypertensive chronic kidney disease with stage 1 through stage 4 chronic kidney disease, or unspecified chronic kidney disease: Secondary | ICD-10-CM | POA: Diagnosis not present

## 2023-06-09 DIAGNOSIS — N184 Chronic kidney disease, stage 4 (severe): Secondary | ICD-10-CM | POA: Diagnosis not present

## 2023-06-09 DIAGNOSIS — N2581 Secondary hyperparathyroidism of renal origin: Secondary | ICD-10-CM | POA: Diagnosis not present

## 2023-06-09 DIAGNOSIS — I779 Disorder of arteries and arterioles, unspecified: Secondary | ICD-10-CM | POA: Diagnosis not present

## 2023-06-09 DIAGNOSIS — D649 Anemia, unspecified: Secondary | ICD-10-CM | POA: Diagnosis not present

## 2023-06-09 DIAGNOSIS — C61 Malignant neoplasm of prostate: Secondary | ICD-10-CM | POA: Diagnosis not present

## 2023-06-09 DIAGNOSIS — I4891 Unspecified atrial fibrillation: Secondary | ICD-10-CM | POA: Diagnosis not present

## 2023-08-11 DIAGNOSIS — L570 Actinic keratosis: Secondary | ICD-10-CM | POA: Diagnosis not present

## 2023-08-11 DIAGNOSIS — Z85828 Personal history of other malignant neoplasm of skin: Secondary | ICD-10-CM | POA: Diagnosis not present

## 2023-08-11 DIAGNOSIS — D1801 Hemangioma of skin and subcutaneous tissue: Secondary | ICD-10-CM | POA: Diagnosis not present

## 2023-08-11 DIAGNOSIS — L821 Other seborrheic keratosis: Secondary | ICD-10-CM | POA: Diagnosis not present

## 2023-09-07 ENCOUNTER — Other Ambulatory Visit: Payer: Self-pay

## 2023-09-07 ENCOUNTER — Emergency Department (HOSPITAL_COMMUNITY)
Admission: EM | Admit: 2023-09-07 | Discharge: 2023-09-07 | Disposition: A | Discharge status: Home / Self Care | Attending: Emergency Medicine | Admitting: Emergency Medicine

## 2023-09-07 ENCOUNTER — Encounter (HOSPITAL_COMMUNITY): Payer: Self-pay | Admitting: Radiology

## 2023-09-07 ENCOUNTER — Emergency Department (HOSPITAL_COMMUNITY)

## 2023-09-07 DIAGNOSIS — N189 Chronic kidney disease, unspecified: Secondary | ICD-10-CM | POA: Diagnosis not present

## 2023-09-07 DIAGNOSIS — D72829 Elevated white blood cell count, unspecified: Secondary | ICD-10-CM | POA: Diagnosis not present

## 2023-09-07 DIAGNOSIS — R1031 Right lower quadrant pain: Secondary | ICD-10-CM | POA: Diagnosis present

## 2023-09-07 DIAGNOSIS — Z7901 Long term (current) use of anticoagulants: Secondary | ICD-10-CM | POA: Diagnosis not present

## 2023-09-07 DIAGNOSIS — N2889 Other specified disorders of kidney and ureter: Secondary | ICD-10-CM | POA: Diagnosis not present

## 2023-09-07 DIAGNOSIS — Z8546 Personal history of malignant neoplasm of prostate: Secondary | ICD-10-CM | POA: Insufficient documentation

## 2023-09-07 DIAGNOSIS — R55 Syncope and collapse: Secondary | ICD-10-CM | POA: Diagnosis not present

## 2023-09-07 DIAGNOSIS — K449 Diaphragmatic hernia without obstruction or gangrene: Secondary | ICD-10-CM | POA: Diagnosis not present

## 2023-09-07 DIAGNOSIS — I2510 Atherosclerotic heart disease of native coronary artery without angina pectoris: Secondary | ICD-10-CM | POA: Diagnosis not present

## 2023-09-07 DIAGNOSIS — R1084 Generalized abdominal pain: Secondary | ICD-10-CM | POA: Diagnosis not present

## 2023-09-07 DIAGNOSIS — R001 Bradycardia, unspecified: Secondary | ICD-10-CM | POA: Diagnosis not present

## 2023-09-07 DIAGNOSIS — R231 Pallor: Secondary | ICD-10-CM | POA: Diagnosis not present

## 2023-09-07 DIAGNOSIS — Z79899 Other long term (current) drug therapy: Secondary | ICD-10-CM | POA: Insufficient documentation

## 2023-09-07 DIAGNOSIS — I129 Hypertensive chronic kidney disease with stage 1 through stage 4 chronic kidney disease, or unspecified chronic kidney disease: Secondary | ICD-10-CM | POA: Insufficient documentation

## 2023-09-07 DIAGNOSIS — K5730 Diverticulosis of large intestine without perforation or abscess without bleeding: Secondary | ICD-10-CM | POA: Diagnosis not present

## 2023-09-07 DIAGNOSIS — I959 Hypotension, unspecified: Secondary | ICD-10-CM | POA: Diagnosis not present

## 2023-09-07 LAB — URINALYSIS, ROUTINE W REFLEX MICROSCOPIC
Bilirubin Urine: NEGATIVE
Glucose, UA: NEGATIVE mg/dL
Hgb urine dipstick: NEGATIVE
Ketones, ur: NEGATIVE mg/dL
Leukocytes,Ua: NEGATIVE
Nitrite: NEGATIVE
Protein, ur: NEGATIVE mg/dL
Specific Gravity, Urine: 1.006 (ref 1.005–1.030)
pH: 5 (ref 5.0–8.0)

## 2023-09-07 LAB — CBC WITH DIFFERENTIAL/PLATELET
Abs Immature Granulocytes: 0.07 K/uL (ref 0.00–0.07)
Basophils Absolute: 0 K/uL (ref 0.0–0.1)
Basophils Relative: 0 %
Eosinophils Absolute: 0 K/uL (ref 0.0–0.5)
Eosinophils Relative: 0 %
HCT: 35.8 % — ABNORMAL LOW (ref 39.0–52.0)
Hemoglobin: 11.9 g/dL — ABNORMAL LOW (ref 13.0–17.0)
Immature Granulocytes: 1 %
Lymphocytes Relative: 7 %
Lymphs Abs: 0.8 K/uL (ref 0.7–4.0)
MCH: 34.7 pg — ABNORMAL HIGH (ref 26.0–34.0)
MCHC: 33.2 g/dL (ref 30.0–36.0)
MCV: 104.4 fL — ABNORMAL HIGH (ref 80.0–100.0)
Monocytes Absolute: 0.9 K/uL (ref 0.1–1.0)
Monocytes Relative: 8 %
Neutro Abs: 9.4 K/uL — ABNORMAL HIGH (ref 1.7–7.7)
Neutrophils Relative %: 84 %
Platelets: 131 K/uL — ABNORMAL LOW (ref 150–400)
RBC: 3.43 MIL/uL — ABNORMAL LOW (ref 4.22–5.81)
RDW: 14.4 % (ref 11.5–15.5)
WBC: 11.3 K/uL — ABNORMAL HIGH (ref 4.0–10.5)
nRBC: 0 % (ref 0.0–0.2)

## 2023-09-07 LAB — COMPREHENSIVE METABOLIC PANEL WITH GFR
ALT: 14 U/L (ref 0–44)
AST: 22 U/L (ref 15–41)
Albumin: 3.3 g/dL — ABNORMAL LOW (ref 3.5–5.0)
Alkaline Phosphatase: 92 U/L (ref 38–126)
Anion gap: 9 (ref 5–15)
BUN: 40 mg/dL — ABNORMAL HIGH (ref 8–23)
CO2: 20 mmol/L — ABNORMAL LOW (ref 22–32)
Calcium: 8.1 mg/dL — ABNORMAL LOW (ref 8.9–10.3)
Chloride: 109 mmol/L (ref 98–111)
Creatinine, Ser: 1.77 mg/dL — ABNORMAL HIGH (ref 0.61–1.24)
GFR, Estimated: 38 mL/min — ABNORMAL LOW (ref >60)
Glucose, Bld: 107 mg/dL — ABNORMAL HIGH (ref 70–99)
Potassium: 4.4 mmol/L (ref 3.5–5.1)
Sodium: 138 mmol/L (ref 135–145)
Total Bilirubin: 0.7 mg/dL (ref 0.0–1.2)
Total Protein: 7.3 g/dL (ref 6.5–8.1)

## 2023-09-07 LAB — LIPASE, BLOOD: Lipase: 48 U/L (ref 11–51)

## 2023-09-07 NOTE — Discharge Instructions (Addendum)
 You were seen today for abdominal pain. Your labs and imaging were reassuring that I have low suspicion for any emergent causes of your symptoms today. Recommend that you continue to take miralax and monitor symptoms at home.  Begin to have any new or worsening symptoms which would include: Fever, worsening abdominal pain, black or red in your stools, persistent vomiting, chest pain or shortness of breath or painful urination, please return to the ED immediately for further evaluation.  Otherwise would like for you to follow-up with your PCP for lab redraw.  You were noted to have a low calcium  as well as elevated white count today.  Both of these I would like to have your labs redrawn and reevaluated by your PCP.
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
## 2023-09-07 NOTE — ED Triage Notes (Signed)
 Pt BIB EMS coming from home c/o abd pain that started 2 hrs ago and feeling constipated. States having a BM 5 days ago. Took a suppository an hr ago with no relief.

## 2023-09-29 DIAGNOSIS — N184 Chronic kidney disease, stage 4 (severe): Secondary | ICD-10-CM | POA: Diagnosis not present

## 2023-09-29 DIAGNOSIS — E785 Hyperlipidemia, unspecified: Secondary | ICD-10-CM | POA: Diagnosis not present

## 2023-09-29 DIAGNOSIS — I129 Hypertensive chronic kidney disease with stage 1 through stage 4 chronic kidney disease, or unspecified chronic kidney disease: Secondary | ICD-10-CM | POA: Diagnosis not present

## 2023-09-29 DIAGNOSIS — R7301 Impaired fasting glucose: Secondary | ICD-10-CM | POA: Diagnosis not present

## 2023-10-12 DIAGNOSIS — L720 Epidermal cyst: Secondary | ICD-10-CM | POA: Diagnosis not present

## 2023-10-12 DIAGNOSIS — L400 Psoriasis vulgaris: Secondary | ICD-10-CM | POA: Diagnosis not present

## 2023-10-12 DIAGNOSIS — L570 Actinic keratosis: Secondary | ICD-10-CM | POA: Diagnosis not present

## 2023-10-12 DIAGNOSIS — L218 Other seborrheic dermatitis: Secondary | ICD-10-CM | POA: Diagnosis not present

## 2023-10-12 DIAGNOSIS — Z85828 Personal history of other malignant neoplasm of skin: Secondary | ICD-10-CM | POA: Diagnosis not present

## 2023-10-20 DIAGNOSIS — M109 Gout, unspecified: Secondary | ICD-10-CM | POA: Diagnosis not present

## 2023-10-20 DIAGNOSIS — N189 Chronic kidney disease, unspecified: Secondary | ICD-10-CM | POA: Diagnosis not present

## 2023-10-20 DIAGNOSIS — I129 Hypertensive chronic kidney disease with stage 1 through stage 4 chronic kidney disease, or unspecified chronic kidney disease: Secondary | ICD-10-CM | POA: Diagnosis not present

## 2023-10-20 DIAGNOSIS — D631 Anemia in chronic kidney disease: Secondary | ICD-10-CM | POA: Diagnosis not present

## 2023-10-20 DIAGNOSIS — N2581 Secondary hyperparathyroidism of renal origin: Secondary | ICD-10-CM | POA: Diagnosis not present

## 2023-10-20 DIAGNOSIS — I4891 Unspecified atrial fibrillation: Secondary | ICD-10-CM | POA: Diagnosis not present

## 2023-10-20 DIAGNOSIS — N184 Chronic kidney disease, stage 4 (severe): Secondary | ICD-10-CM | POA: Diagnosis not present

## 2023-10-21 LAB — LAB REPORT - SCANNED
Albumin, Urine POC: 308.2
Albumin/Creatinine Ratio, Urine, POC: 185
Creatinine, POC: 166.8 mg/dL
EGFR: 43

## 2023-10-22 ENCOUNTER — Ambulatory Visit (HOSPITAL_COMMUNITY): Payer: Self-pay | Admitting: Physician Assistant

## 2023-12-02 DIAGNOSIS — H52203 Unspecified astigmatism, bilateral: Secondary | ICD-10-CM | POA: Diagnosis not present

## 2023-12-02 DIAGNOSIS — H3500 Unspecified background retinopathy: Secondary | ICD-10-CM | POA: Diagnosis not present

## 2023-12-02 DIAGNOSIS — H43813 Vitreous degeneration, bilateral: Secondary | ICD-10-CM | POA: Diagnosis not present

## 2023-12-02 DIAGNOSIS — H2513 Age-related nuclear cataract, bilateral: Secondary | ICD-10-CM | POA: Diagnosis not present

## 2023-12-02 DIAGNOSIS — H25013 Cortical age-related cataract, bilateral: Secondary | ICD-10-CM | POA: Diagnosis not present

## 2023-12-02 DIAGNOSIS — H524 Presbyopia: Secondary | ICD-10-CM | POA: Diagnosis not present

## 2023-12-02 DIAGNOSIS — Z23 Encounter for immunization: Secondary | ICD-10-CM | POA: Diagnosis not present

## 2023-12-02 DIAGNOSIS — H5213 Myopia, bilateral: Secondary | ICD-10-CM | POA: Diagnosis not present

## 2023-12-28 ENCOUNTER — Encounter: Payer: Self-pay | Admitting: *Deleted

## 2023-12-29 NOTE — Progress Notes (Unsigned)
 OFFICE NOTE:    Date:  12/30/2023  ID:  Ran, Tullis 1940-08-23, MRN [id number] PCP: Shayne Anes, MD  [facility] Providers Cardiologist:  Georganna Archer, MD Cardiology APP:  Lelon Glendia DASEN, PA-C        Coronary artery disease  Myoview  04/10/17: EF 65, normal perfusion, low risk CAC score 12/01/2018: 3v Ca2+, 2646 (92nd percentile) Paroxsymal atrial fibrillation  Post conversion pauses >> no beta-blocker  Eval by EP in past (Dr. 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  History of Present Illness Timothy [name] is a 83 y.o. male for follow up of CAD, AFib. Last seen in 08/2022.   He has a history of coronary artery disease with a significant coronary artery calcium  score of 2646 in 2020, placing him in the 90th percentile. A coronary CTA was considered but not performed due to his chronic kidney disease and lack of symptoms at that time.   He remains active but notes decreased exercise tolerance and some shortness of breath with exertion, although he does not experience chest discomfort during these activities. He has not required nitroglycerin . He experiences paroxysmal atrial fibrillation with episodes that he feels are increasing over time. These episodes are brief, lasting 5-10 minutes, and occur every few weeks. They are often  triggered by focused activities rather than strenuous exertion. During these episodes, he experiences a sensation of 'an elephant on my chest' and shortness of breath, which resolve with rest. He notes that these have been chronic symptoms without change with his atrial fibrillation. He reports that he has not had any episodes of passing out. He monitors his blood pressure at home, which usually runs around 135/70 mmHg. He reports low energy levels, which he attributes in part to his age and the recent passing of his wife in February, which has significantly impacted his life. He continues to engage in activities such as hiking and yard work, although at a reduced capacity compared to previous years.    ROS-See HPI    Studies Reviewed:  EKG Interpretation Date/Time:  Wednesday December 30 2023 10:24:03 EST Ventricular Rate:  69 PR Interval:  172 QRS Duration:  98 QT Interval:  364 QTC Calculation: 390 R Axis:   28  Text Interpretation: Normal sinus rhythm Possible Anterior infarct , age undetermined ST & T wave abnormality, consider inferior ischemia Similar to old EKGs in the past Confirmed by Lelon Glendia [phone]) on 12/30/2023 11:22:56 AM    Labs 09/07/23: K 4.4, SCr 1.77, eGFR 38, ALT 14, Hgb 11.9, PLT 131K Results LABS Creatinine: 1.61 (10/21/2023) Potassium: 3.8 (10/21/2023) Hemoglobin: 11.6 (10/21/2023) Platelet count: 139,000 (10/21/2023)   Risk Assessment/Calculations: CHA2DS2-VASc Score = 6   This indicates a 9.7% annual risk of stroke. The patient's score is based upon: CHF History: 0 HTN History: 1 Diabetes History: 0  Stroke History: 2 Vascular Disease History: 1 Age Score: 2 Gender Score: 0     HYPERTENSION CONTROL Vitals:   12/30/23 1020 12/30/23 1140  BP: (!) 171/83 (!) 144/54    The patient's blood pressure is elevated above target today.  In order to address the patient's elevated BP: Blood pressure will be monitored at home to determine if medication  changes need to be made.         Physical Exam:  VS:  BP (!) 144/54   Pulse 81   Ht 5' 9 (1.753 m)   Wt 151 lb 6.4 oz (68.7 kg)   SpO2 97%   BMI 22.36 kg/m        Wt Readings from Last 3 Encounters:  12/30/23 151 lb 6.4 oz (68.7 kg)  09/07/23 145 lb (65.8 kg)  12/17/22 152 lb (68.9 kg)    Constitutional:      Appearance: Healthy appearance. Not in distress.  Neck:     Vascular: JVD normal.  Pulmonary:     Breath sounds: Normal breath sounds. No wheezing. No rales.  Cardiovascular:     Normal rate. Regular rhythm.     Murmurs: There is no murmur.  Edema:    Peripheral edema absent.  Abdominal:     Palpations: Abdomen is soft.       Assessment and Plan:    Assessment & Plan Coronary artery disease involving native coronary artery of native heart without angina pectoris Shortness of breath Calcium  score of 2646 (92nd percentile) in 2020.  He notes recent, intermittent exertional shortness of breath and decreased exercise tolerance. No chest discomfort with exertion, but chest discomfort with rapid AFib.  There are no significant changes on EKG today.  Given his heavy calcium  burden, I have recommended proceeding with stress testing to rule out significant ischemia. - Obtain PET CT myocardial perfusion imaging to assess for significant ischemia. - Continue Repatha 140 mg every two weeks. - Continue Zetia 10 mg daily. - Follow-up 6 months with Dr. Floretta (given Dr. Allena retirement) or me PAF (paroxysmal atrial fibrillation) [facility]) He notes occasional episodes of atrial fibrillation lasting 5-10 minutes.  These seem to be increasing in frequency.  He does have associated chest discomfort during rapid AFib episodes.  This is a historical symptom without significant change.  We discussed whether or not to refer him to EP for consideration of PVI ablation versus antiarrhythmic drug therapy.  He would like to pursue this.  His creatinine has remained 1.5 or higher and he is  over the age of 29.  He is on the appropriate dose of Eliquis .  He is not on beta-blocker therapy as he had post termination pauses in the past. - Refer to electrophysiologist for evaluation and management of atrial fibrillation. - Continue Eliquis  2.5 mg daily. Mild AI (aortic insufficiency) Consider repeat echo in 2 to 3 years. Pure hypercholesterolemia LDL cholesterol well-controlled with recent LDL at 12 mg/dL. - Continue Repatha 140 mg every two weeks. - Continue Zetia 10 mg daily. Primary hypertension Blood pressure slightly elevated today, but home readings are generally within target range. Current medications include amlodipine  and telmisartan. Discussed potential need for additional antihypertensive therapy if home readings remain elevated. - Continue amlodipine  10 mg daily. - Continue telmisartan 80 mg daily. - Monitor blood pressure at home for two weeks and send readings for review. - Will consider hydralazine if blood pressures remain above target. Stage 3a chronic kidney disease (HCC) He is followed by nephrology.  Informed Consent   Shared Decision Making/Informed Consent The risks [chest pain, shortness of breath, cardiac arrhythmias, dizziness, blood pressure fluctuations, myocardial infarction, stroke/transient ischemic attack, nausea, vomiting, allergic reaction, radiation exposure, metallic taste sensation and life-threatening complications (estimated to be 1 in 10,000)], benefits (risk stratification, diagnosing coronary artery disease, treatment guidance) and alternatives of a cardiac PET stress test were discussed in detail with Mr. Fill and he agrees to proceed.     Dispo:  Return in about 6 months (around 06/28/2024) for Routine Follow Up w Dr. Floretta, or Glendia Ferrier, PA-C.  Signed, Glendia Ferrier, PA-C

## 2023-12-30 ENCOUNTER — Encounter: Payer: Self-pay | Admitting: Physician Assistant

## 2023-12-30 ENCOUNTER — Ambulatory Visit: Attending: Physician Assistant | Admitting: Physician Assistant

## 2023-12-30 VITALS — BP 144/54 | HR 81 | Ht 69.0 in | Wt 151.4 lb

## 2023-12-30 DIAGNOSIS — N1831 Chronic kidney disease, stage 3a: Secondary | ICD-10-CM | POA: Insufficient documentation

## 2023-12-30 DIAGNOSIS — E7800 Pure hypercholesterolemia, unspecified: Secondary | ICD-10-CM | POA: Insufficient documentation

## 2023-12-30 DIAGNOSIS — I2510 Atherosclerotic heart disease of native coronary artery without angina pectoris: Secondary | ICD-10-CM | POA: Insufficient documentation

## 2023-12-30 DIAGNOSIS — I351 Nonrheumatic aortic (valve) insufficiency: Secondary | ICD-10-CM | POA: Insufficient documentation

## 2023-12-30 DIAGNOSIS — I10 Essential (primary) hypertension: Secondary | ICD-10-CM | POA: Diagnosis not present

## 2023-12-30 DIAGNOSIS — R0602 Shortness of breath: Secondary | ICD-10-CM | POA: Diagnosis not present

## 2023-12-30 DIAGNOSIS — I480 Paroxysmal atrial fibrillation: Secondary | ICD-10-CM | POA: Diagnosis not present

## 2023-12-30 NOTE — Assessment & Plan Note (Signed)
 Consider repeat echo in 2 to 3 years.

## 2023-12-30 NOTE — Patient Instructions (Addendum)
 Medication Instructions:  Your physician recommends that you continue on your current medications as directed. Please refer to the Current Medication list given to you today.  *If you need a refill on your cardiac medications before your next appointment, please call your pharmacy*  Lab Work: None Ordered If you have labs (blood work) drawn today and your tests are completely normal, you will receive your results only by: MyChart Message (if you have MyChart) OR A paper copy in the mail If you have any lab test that is abnormal or we need to change your treatment, we will call you to review the results.  Testing/Procedures: Cardiac Stress PET Scan  Follow-Up: At [facility], you and your health needs are our priority.  As part of our continuing mission to provide you with exceptional heart care, our providers are all part of one team.  This team includes your primary Cardiologist (physician) and Advanced Practice Providers or APPs (Physician Assistants and Nurse Practitioners) who all work together to provide you with the care you need, when you need it.  Your next appointment:   6 month(s)  Provider:   Georganna Archer, MD or Glendia Ferrier, PA-C   We recommend signing up for the patient portal called MyChart.  Sign up information is provided on this After Visit Summary.  MyChart is used to connect with patients for Virtual Visits (Telemedicine).  Patients are able to view lab/test results, encounter notes, upcoming appointments, etc.  Non-urgent messages can be sent to your provider as well.   To learn more about what you can do with MyChart, go to [email or web address].   Other Instructions  Ambulatory referral to Electrophysiology with Dr. Inocencio, Mealor, Kennyth, or [place]    Please report to Radiology at the [facility] Main Entrance 30 minutes early for your test.  [address] [place], [place] [address]   How to Prepare for Your Cardiac PET/CT  Stress Test:  Nothing to eat or drink, except water, 3 hours prior to arrival time.  NO caffeine/decaffeinated products, or chocolate 12 hours prior to arrival. (Please note decaffeinated beverages (teas/coffees) still contain caffeine).  If you have caffeine within 12 hours prior, the test will need to be rescheduled.  Medication instructions:   None  Diabetic Preparation: If able to eat breakfast prior to 3 hour fasting, you may take all medications.  You may take your remaining medications with water.  NO perfume, cologne or lotion on chest or abdomen area.  Total time is 1 to 2 hours; you may want to bring reading material for the waiting time.  IF YOU THINK YOU MAY BE PREGNANT, OR ARE NURSING PLEASE INFORM THE TECHNOLOGIST.  In preparation for your appointment, medication and supplies will be purchased.  Appointment availability is limited, so if you need to cancel or reschedule, please call the Radiology Department Scheduler at [phone] 24 hours in advance to avoid a cancellation fee of $100.00  What to Expect When you Arrive:  Once you arrive and check in for your appointment, you will be taken to a preparation room within the Radiology Department.  A technologist or Nurse will obtain your medical history, verify that you are correctly prepped for the exam, and explain the procedure.  Afterwards, an IV will be started in your arm and electrodes will be placed on your skin for EKG monitoring during the stress portion of the exam. Then you will be escorted to the PET/CT scanner.  There, staff will get you  positioned on the scanner and obtain a blood pressure and EKG.  During the exam, you will continue to be connected to the EKG and blood pressure machines.  A small, safe amount of a radioactive tracer will be injected in your IV to obtain a series of pictures of your heart along with an injection of a stress agent.    After your Exam:  It is recommended that you eat a meal and  drink a caffeinated beverage to counter act any effects of the stress agent.  Drink plenty of fluids for the remainder of the day and urinate frequently for the first couple of hours after the exam.  Your doctor will inform you of your test results within 7-10 business days.  For more information and frequently asked questions, please visit our website: [email or web address]  For questions about your test or how to prepare for your test, please call: Cardiac Imaging Nurse Navigators Office: [phone]

## 2023-12-30 NOTE — Assessment & Plan Note (Signed)
 Calcium  score of 2646 (92nd percentile) in 2020.  He notes recent, intermittent exertional shortness of breath and decreased exercise tolerance. No chest discomfort with exertion, but chest discomfort with rapid AFib.  There are no significant changes on EKG today.  Given his heavy calcium  burden, I have recommended proceeding with stress testing to rule out significant ischemia. - Obtain PET CT myocardial perfusion imaging to assess for significant ischemia. - Continue Repatha 140 mg every two weeks. - Continue Zetia 10 mg daily. - Follow-up 6 months with Dr. Floretta (given Dr. Allena retirement) or me

## 2023-12-30 NOTE — Assessment & Plan Note (Signed)
He is followed by nephrology. °

## 2023-12-30 NOTE — Assessment & Plan Note (Signed)
 LDL cholesterol well-controlled with recent LDL at 12 mg/dL. - Continue Repatha 140 mg every two weeks. - Continue Zetia 10 mg daily.

## 2023-12-30 NOTE — Assessment & Plan Note (Signed)
 Blood pressure slightly elevated today, but home readings are generally within target range. Current medications include amlodipine  and telmisartan. Discussed potential need for additional antihypertensive therapy if home readings remain elevated. - Continue amlodipine  10 mg daily. - Continue telmisartan 80 mg daily. - Monitor blood pressure at home for two weeks and send readings for review. - Will consider hydralazine if blood pressures remain above target.

## 2023-12-30 NOTE — Assessment & Plan Note (Signed)
 He notes occasional episodes of atrial fibrillation lasting 5-10 minutes.  These seem to be increasing in frequency.  He does have associated chest discomfort during rapid AFib episodes.  This is a historical symptom without significant change.  We discussed whether or not to refer him to EP for consideration of PVI ablation versus antiarrhythmic drug therapy.  He would like to pursue this.  His creatinine has remained 1.5 or higher and he is over the age of 75.  He is on the appropriate dose of Eliquis .  He is not on beta-blocker therapy as he had post termination pauses in the past. - Refer to electrophysiologist for evaluation and management of atrial fibrillation. - Continue Eliquis  2.5 mg daily.

## 2024-01-20 ENCOUNTER — Encounter (HOSPITAL_COMMUNITY): Payer: Self-pay

## 2024-01-26 ENCOUNTER — Ambulatory Visit (HOSPITAL_COMMUNITY)
Admission: RE | Admit: 2024-01-26 | Discharge: 2024-01-26 | Disposition: A | Source: Ambulatory Visit | Attending: Physician Assistant | Admitting: Physician Assistant

## 2024-01-26 DIAGNOSIS — I2510 Atherosclerotic heart disease of native coronary artery without angina pectoris: Secondary | ICD-10-CM | POA: Insufficient documentation

## 2024-01-26 DIAGNOSIS — R0602 Shortness of breath: Secondary | ICD-10-CM | POA: Diagnosis present

## 2024-01-26 LAB — NM PET CT CARDIAC PERFUSION MULTI W/ABSOLUTE BLOODFLOW
LV dias vol: 97 mL (ref 62–150)
LV sys vol: 28 mL (ref 4.2–5.8)
MBFR: 2.1
Nuc Rest EF: 66 %
Nuc Stress EF: 71 %
Peak HR: 78 {beats}/min
Rest HR: 50 {beats}/min
Rest MBF: 0.71 ml/g/min
Rest Nuclear Isotope Dose: 17.7 mCi
ST Depression (mm): 0 mm
Stress MBF: 1.49 ml/g/min
Stress Nuclear Isotope Dose: 17.4 mCi
TID: 0.95

## 2024-01-26 MED ORDER — REGADENOSON 0.4 MG/5ML IV SOLN
0.4000 mg | Freq: Once | INTRAVENOUS | Status: AC
  Administered 2024-01-26: 0.4 mg via INTRAVENOUS

## 2024-01-26 MED ORDER — RUBIDIUM RB82 GENERATOR (RUBYFILL)
17.4300 | PACK | Freq: Once | INTRAVENOUS | Status: AC
  Administered 2024-01-26: 17.43 via INTRAVENOUS

## 2024-01-26 MED ORDER — RUBIDIUM RB82 GENERATOR (RUBYFILL)
17.6800 | PACK | Freq: Once | INTRAVENOUS | Status: AC
  Administered 2024-01-26: 17.68 via INTRAVENOUS

## 2024-01-26 MED ORDER — REGADENOSON 0.4 MG/5ML IV SOLN
INTRAVENOUS | Status: AC
  Filled 2024-01-26: qty 5

## 2024-01-26 NOTE — Progress Notes (Signed)
 Stress test completed. Patient had some alternations in blood pressure. Denies symptoms. No major EKG changes. Obtained one final BP sitting up, see flowsheets. Skin pink, warm and dry. Strong radials

## 2024-01-27 ENCOUNTER — Ambulatory Visit: Payer: Self-pay | Admitting: Physician Assistant

## 2024-02-04 ENCOUNTER — Other Ambulatory Visit (HOSPITAL_COMMUNITY): Payer: Self-pay | Admitting: Internal Medicine

## 2024-02-04 DIAGNOSIS — J849 Interstitial pulmonary disease, unspecified: Secondary | ICD-10-CM

## 2024-02-09 ENCOUNTER — Ambulatory Visit (HOSPITAL_BASED_OUTPATIENT_CLINIC_OR_DEPARTMENT_OTHER): Admission: RE | Admit: 2024-02-09 | Discharge: 2024-02-09 | Attending: Internal Medicine | Admitting: Internal Medicine

## 2024-02-09 DIAGNOSIS — J849 Interstitial pulmonary disease, unspecified: Secondary | ICD-10-CM | POA: Insufficient documentation

## 2024-02-10 DIAGNOSIS — N184 Chronic kidney disease, stage 4 (severe): Secondary | ICD-10-CM | POA: Diagnosis not present

## 2024-02-10 DIAGNOSIS — I129 Hypertensive chronic kidney disease with stage 1 through stage 4 chronic kidney disease, or unspecified chronic kidney disease: Secondary | ICD-10-CM | POA: Diagnosis not present

## 2024-02-10 DIAGNOSIS — N189 Chronic kidney disease, unspecified: Secondary | ICD-10-CM | POA: Diagnosis not present

## 2024-02-10 DIAGNOSIS — N2581 Secondary hyperparathyroidism of renal origin: Secondary | ICD-10-CM | POA: Diagnosis not present

## 2024-02-10 DIAGNOSIS — D631 Anemia in chronic kidney disease: Secondary | ICD-10-CM | POA: Diagnosis not present

## 2024-02-10 DIAGNOSIS — I4891 Unspecified atrial fibrillation: Secondary | ICD-10-CM | POA: Diagnosis not present

## 2024-02-11 LAB — LAB REPORT - SCANNED
Albumin, Urine POC: 158.1
Creatinine, POC: 112.8 mg/dL
EGFR: 39
Microalb Creat Ratio: 140

## 2024-02-16 ENCOUNTER — Ambulatory Visit: Admitting: Cardiovascular Disease

## 2024-02-19 ENCOUNTER — Ambulatory Visit: Payer: Self-pay | Admitting: Physician Assistant

## 2024-02-29 NOTE — Telephone Encounter (Signed)
 Spoke w/ patient - he is scheduled with Dr. Inocencio on 1/19 in [place].

## 2024-03-14 ENCOUNTER — Encounter: Payer: Self-pay | Admitting: Cardiology

## 2024-03-14 ENCOUNTER — Ambulatory Visit: Attending: Cardiology | Admitting: Cardiology

## 2024-03-14 VITALS — BP 128/70 | HR 52 | Ht 69.0 in | Wt 152.4 lb

## 2024-03-14 DIAGNOSIS — I480 Paroxysmal atrial fibrillation: Secondary | ICD-10-CM | POA: Insufficient documentation

## 2024-03-14 DIAGNOSIS — I10 Essential (primary) hypertension: Secondary | ICD-10-CM | POA: Diagnosis present

## 2024-03-14 DIAGNOSIS — D6869 Other thrombophilia: Secondary | ICD-10-CM | POA: Insufficient documentation

## 2024-03-14 DIAGNOSIS — I2510 Atherosclerotic heart disease of native coronary artery without angina pectoris: Secondary | ICD-10-CM | POA: Diagnosis not present

## 2024-03-14 NOTE — Patient Instructions (Signed)
 Medication Instructions:  Your physician recommends that you continue on your current medications as directed. Please refer to the Current Medication list given to you today.  *If you need a refill on your cardiac medications before your next appointment, please call your pharmacy* Follow-Up: At [facility], you and your health needs are our priority.  As part of our continuing mission to provide you with exceptional heart care, our providers are all part of one team.  This team includes your primary Cardiologist (physician) and Advanced Practice Providers or APPs (Physician Assistants and Nurse Practitioners) who all work together to provide you with the care you need, when you need it.  Your next appointment:   1 year(s)  Provider:   You may see Will Gladis Norton, MD or one of the following Advanced Practice Providers on your designated Care Team:   Charlies Arthur, [place] Ozell Jodie Passey, PA-C Suzann Riddle, NP Daphne Barrack, NP Artist Pouch, PA-C

## 2024-03-14 NOTE — Progress Notes (Signed)
 " Electrophysiology Office Note:   Date:  03/14/2024  ID:  Timothy [name] [birth year], MRN [id number]  Primary Cardiologist: Georganna Archer, MD Primary Heart Failure: None Electrophysiologist: Timothy Goates Gladis Norton, MD      History of Present Illness:   Timothy [name] is a 84 y.o. 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    He has been having episodes of paroxysmal atrial fibrillation.  Unfortunately episodes are increasing.  Episodes are brief only lasting 5 to 10 minutes and occur every few weeks.  They are triggered by focused activities rather than strenuous exertion.  During these episodes, he has chest pressure and shortness of breath.  These resolved with rest.  Discussed the use of AI scribe software for clinical note transcription with the patient, who gave verbal consent to proceed.  History of Present Illness Timothy [name] is an 84 year old male with atrial fibrillation who presents for evaluation of heart rhythm disturbances. He has been under the care of Timothy [name], who conducted a stress test that returned normal results.  He has a history of atrial fibrillation and premature contractions, which he can predict due to a prodrome. These episodes have not worsened over time. He reports episodes of atrial fibrillation, with the longest lasting about 30 minutes. He manages these episodes by sitting down and relaxing until they pass. No rebound effects are noted after episodes.  Three weeks ago, he received a COVID and RSV vaccine, after which he felt significantly unwell for two to three days, with symptoms including fatigue and an unsteady heart. He recovered after three to four days and has  felt well since. He attributes this reaction to the vaccines, although he has never had a reaction to vaccines before.  He reports occasional episodes of atrial fibrillation, sometimes occurring on consecutive days, followed by weeks without any episodes. He monitors his pulse during episodes and resumes normal activities once they resolve. His blood pressure readings at home have been variable, with a recent reading of 145 mmHg, but he does not feel anxious about these readings.    Review of systems complete and found to be negative unless listed in HPI.   EP Information / Studies Reviewed:    EKG is ordered today. Personal review as below.  EKG Interpretation Date/Time:  Monday March 14 2024 13:44:13 EST Ventricular Rate:  52 PR Interval:  172 QRS Duration:  98 QT Interval:  386 QTC Calculation: 358 R Axis:   54  Text Interpretation: Sinus bradycardia Cannot rule out Anterior infarct (cited on or before 30-Dec-2023) ST & T wave abnormality, consider inferior ischemia When compared with ECG of 30-Dec-2023 10:24, No significant change was found Confirmed by Timothy [name] ([id number]) on 03/14/2024 1:48:19 PM     Physical Exam:   VS:  BP 128/70 (BP Location: Left Arm, Patient Position: Sitting, Cuff Size: Normal)   Pulse (!) 52   Ht 5' 9 (1.753 m)   Wt 152 lb 6.4 oz (69.1 kg)   SpO2 98%   BMI 22.51 kg/m    Wt Readings from Last 3 Encounters:  03/14/24 152 lb 6.4 oz (69.1 kg)  12/30/23 151 lb 6.4 oz (68.7 kg)  09/07/23 145 lb (65.8 kg)     GEN: Well nourished, well  developed in no acute distress NECK: No JVD; No carotid bruits CARDIAC: Regular rate and rhythm, no murmurs, rubs, gallops RESPIRATORY:  Clear to auscultation without rales, wheezing or rhonchi  ABDOMEN: Soft, non-tender, non-distended EXTREMITIES:  No edema; No deformity   Risk Assessment/Calculations:    CHA2DS2-VASc Score = 6   This indicates a 9.7% annual risk of stroke. The patient's score is based  upon: CHF History: 0 HTN History: 1 Diabetes History: 0 Stroke History: 2 Vascular Disease History: 1 Age Score: 2 Gender Score: 0      ASSESSMENT AND PLAN:    1.  Paroxysmal atrial fibrillation: Fortunately episodes are short, usually lasting 5 to 10 minutes.  He is currently feeling well.  He has short episodes that are not overly bothersome.  For now, he is happy to continue to monitor these.  He would be candidate for ablation if episodes become longer and more frequent.  2.  Secondary hypercoagulable state: On Eliquis   3.  Coronary artery disease: Recent low risk PET scan.  Plan per primary cardiology.  4.  Hypertension: Well-controlled  Follow up with EP Team in 12 months  Signed, Bridey Brookover Gladis Norton, MD  "

## 2024-03-21 ENCOUNTER — Ambulatory Visit: Admitting: Pulmonary Disease

## 2024-03-21 NOTE — Progress Notes (Incomplete)
 " Cardiology Office Note:  .   Date:  03/21/2024  ID:  Timothy [name], Timothy [name] 01-22-1941, MRN [id number] PCP: Shayne Anes, MD  [facility] Providers Cardiologist:  Georganna Archer, MD Cardiology APP:  Lelon Glendia DASEN, PA-C  Electrophysiologist:  Will Gladis Norton, MD { Chief Complaint: No chief complaint on file.   History of Present Illness: .    Timothy [name] is a 84 y.o. male with a PMH of elevated CAC (score >2000), PAF (on Eliquis ), CAS s/p R carotid stent (2004), HLD, HTN, CKD who presents for follow-up.   Pre-Chart Notes: - Last seen by Glendia Lelon in November and EP few weeks ago.  Discussed the use of AI scribe software for clinical note transcription with the patient, who gave verbal consent to proceed.  History of Present Illness       Studies Reviewed: SABRA    EKG: ***       Cardiac Studies & Procedures   ______________________________________________________________________________________________   STRESS TESTS  NM PET CT CARDIAC PERFUSION MULTI W/ABSOLUTE BLOODFLOW 01/26/2024  Narrative   The study is normal. The study is low risk.   LV perfusion is normal. There is no evidence of ischemia. There is no evidence of infarction.   Rest left ventricular function is normal. Rest EF: 66%. Stress left ventricular function is normal. Stress EF: 71%. End diastolic cavity size is normal. End systolic cavity size is normal. No evidence of transient ischemic dilation (TID) noted.   Myocardial blood flow was computed to be 0.67ml/g/min at rest and 1.47ml/g/min at stress. Global myocardial blood flow reserve was 2.10 and was normal.   Coronary calcium  was present on the attenuation correction CT images. Severe coronary calcifications were present. Coronary calcifications were present in the left anterior descending artery, left circumflex artery and right coronary artery distribution(s).   Electronically signed by: Soyla DELENA Merck, MD  CLINICAL DATA:  This  over-read does not include interpretation of cardiac or coronary anatomy or pathology. The cardiac SPECT CT interpretation by the cardiologist is attached.  COMPARISON:  None Available.  FINDINGS: Mediastinum/Nodes: No solid / cystic mediastinal masses. The visualized esophagus is nondistended precluding optimal assessment. No thoracic lymphadenopathy by size criteria.  Lungs/Pleura: Imaged tracheo-bronchial tree is patent. There are peripheral/subpleural reticulations with patchy areas of bronchiectasis. Concerning for chronic interstitial lung disease. Correlate clinically to determine the need for dedicated chest imaging. No mass or consolidation. No pleural effusion or pneumothorax. No suspicious lung nodules.  Upper Abdomen: Visualized upper abdominal viscera within normal limits. There is a small sliding hiatal hernia.  Musculoskeletal: The visualized soft tissues of the chest wall are grossly unremarkable. No suspicious osseous lesions.  IMPRESSION: *There are peripheral/subpleural reticulations with patchy areas of bronchiectasis. Concerning for chronic interstitial lung disease.   Electronically Signed By: Ree Molt M.D. On: 01/26/2024 12:25   ECHOCARDIOGRAM  ECHOCARDIOGRAM COMPLETE 11/04/2022  Narrative ECHOCARDIOGRAM REPORT    Patient Name:   Timothy [name] Date of Exam: 10/31/2022 Medical Rec #:  [id number]       Height:       69.0 in Accession #:    [id number]      Weight:       151.2 lb Date of Birth:  [birth year]        BSA:          1.834 m Patient Age:    82 years        BP:  134/50 mmHg Patient Gender: M               HR:           61 bpm. Exam Location:  [address]  Procedure: 2D Echo, Cardiac Doppler and Color Doppler  Indications:    I48.0 Paroxysmal atrial fibrillation  History:        Patient has prior history of Echocardiogram examinations, most recent 10/28/2016. MR, Arrythmias:Atrial Fibrillation and  PVC, Signs/Symptoms:Murmur; Risk Factors:Hypertension and Dyslipidemia.  Sonographer:    Elsie Bohr RDCS Referring Phys: [id number] GLENDIA DASEN WEAVER  IMPRESSIONS   1. Left ventricular ejection fraction, by estimation, is 60 to 65%. The left ventricle has normal function. The left ventricle has no regional wall motion abnormalities. There is moderate concentric left ventricular hypertrophy. Left ventricular diastolic function could not be evaluated. 2. Right ventricular systolic function is normal. The right ventricular size is normal. There is normal pulmonary artery systolic pressure. The estimated right ventricular systolic pressure is 34.6 mmHg. 3. The mitral valve is normal in structure. Trivial mitral valve regurgitation. No evidence of mitral stenosis. 4. The aortic valve is normal in structure. There is severe calcifcation of the aortic valve. There is severe thickening of the aortic valve. Aortic valve regurgitation is mild. No aortic stenosis is present. 5. The inferior vena cava is normal in size with greater than 50% respiratory variability, suggesting right atrial pressure of 3 mmHg.  FINDINGS Left Ventricle: Left ventricular ejection fraction, by estimation, is 60 to 65%. The left ventricle has normal function. The left ventricle has no regional wall motion abnormalities. The left ventricular internal cavity size was normal in size. There is moderate concentric left ventricular hypertrophy. Left ventricular diastolic function could not be evaluated due to atrial fibrillation. Left ventricular diastolic function could not be evaluated.  Right Ventricle: The right ventricular size is normal. No increase in right ventricular wall thickness. Right ventricular systolic function is normal. There is normal pulmonary artery systolic pressure. The tricuspid regurgitant velocity is 2.81 m/s, and with an assumed right atrial pressure of 3 mmHg, the estimated right ventricular systolic pressure  is 34.6 mmHg.  Left Atrium: Left atrial size was normal in size.  Right Atrium: Right atrial size was normal in size.  Pericardium: There is no evidence of pericardial effusion.  Mitral Valve: The mitral valve is normal in structure. Trivial mitral valve regurgitation. No evidence of mitral valve stenosis.  Tricuspid Valve: The tricuspid valve is normal in structure. Tricuspid valve regurgitation is trivial. No evidence of tricuspid stenosis.  Aortic Valve: The aortic valve is normal in structure. There is severe calcifcation of the aortic valve. There is severe thickening of the aortic valve. Aortic valve regurgitation is mild. No aortic stenosis is present.  Pulmonic Valve: The pulmonic valve was normal in structure. Pulmonic valve regurgitation is mild. No evidence of pulmonic stenosis.  Aorta: The aortic root is normal in size and structure.  Venous: The inferior vena cava is normal in size with greater than 50% respiratory variability, suggesting right atrial pressure of 3 mmHg.  IAS/Shunts: No atrial level shunt detected by color flow Doppler.   LEFT VENTRICLE PLAX 2D LVIDd:         4.10 cm   Diastology LVIDs:         2.60 cm   LV e' medial:    12.50 cm/s LV PW:         1.30 cm   LV E/e' medial:  7.4 LV IVS:  1.40 cm   LV e' lateral:   20.18 cm/s LVOT diam:     2.00 cm   LV E/e' lateral: 4.6 LV SV:         57 LV SV Index:   31 LVOT Area:     3.14 cm   RIGHT VENTRICLE            IVC RVSP:           34.6 mmHg  IVC diam: 0.70 cm  LEFT ATRIUM             Index        RIGHT ATRIUM           Index LA diam:        3.70 cm 2.02 cm/m   RA Pressure: 3.00 mmHg LA Vol (A2C):   43.3 ml 23.60 ml/m  RA Area:     14.80 cm LA Vol (A4C):   34.7 ml 18.92 ml/m  RA Volume:   34.20 ml  18.64 ml/m LA Biplane Vol: 39.0 ml 21.26 ml/m AORTIC VALVE LVOT Vmax:   99.04 cm/s LVOT Vmean:  62.580 cm/s LVOT VTI:    0.183 m  AORTA Ao Root diam: 3.40 cm Ao Asc diam:  3.70  cm  MITRAL VALVE               TRICUSPID VALVE MV Area (PHT): 3.96 cm    TR Peak grad:   31.6 mmHg MV Decel Time: 192 msec    TR Vmax:        281.00 cm/s MV E velocity: 92.28 cm/s  Estimated RAP:  3.00 mmHg RVSP:           34.6 mmHg  SHUNTS Systemic VTI:  0.18 m Systemic Diam: 2.00 cm  Wilbert Bihari MD Electronically signed by Wilbert Bihari MD Signature Date/Time: 10/31/2022/12:16:43 PM    Final    MONITORS  CARDIAC EVENT MONITOR 10/28/2016  Narrative  Paroxysmal atrial fib  He has significant pauses - both during atrial fib and with post conversion pauses.  Please have him Stop his Atenolol  completely at this point .  He has symptoms that match up with the timing of these pauses.   CT SCANS  CT CARDIAC SCORING (SELF PAY ONLY) 12/01/2018  Narrative CLINICAL DATA:  84 year old male with hyperlipidemia  EXAM: CT HEART FOR CALCIUM  SCORING  TECHNIQUE: CT heart was performed on a 256 channel system using prospective ECG gating. A scout and noncontrast exam (for calcium  scoring) were performed. Note that this exam targets the heart and the chest was not imaged in its entirety.  COMPARISON:  None.  FINDINGS: Technical quality: Suboptimal due to atrial fibrillation. Significant cardiac motion.  Extensive coronary artery calcifications all 3 vessels.  CORONARY CALCIUM   Total Agatston Score: 2646  MESA database percentile: 92 th  Ascending aorta ( <  40 mm): 38  EXTRACARDIAC FINDINGS:  Limited view of the lung parenchyma demonstrates subpleural reticulation. Airways are normal.  Limited view of the mediastinum demonstrates no adenopathy. Esophagus normal.  Limited view of the upper abdomen unremarkable.  Limited view of the skeleton and chest wall is unremarkable.  IMPRESSION: 1. Extensive 3 vessel coronary artery calcification.  2. Total Agatston Score: 2646  3. MESA age and sex matched database percentile: 7 nd   Electronically Signed By:  Jackquline Boxer M.D. On: 12/01/2018 20:27     ______________________________________________________________________________________________      Results   Risk Assessment/Calculations:    {Does this patient  have ATRIAL FIBRILLATION?:[id number]} No BP recorded.  {Refresh Note OR Click here to enter BP  :1}***        Physical Exam:    VS:  There were no vitals taken for this visit. ***    Wt Readings from Last 3 Encounters:  03/14/24 152 lb 6.4 oz (69.1 kg)  12/30/23 151 lb 6.4 oz (68.7 kg)  09/07/23 145 lb (65.8 kg)     GEN: Well nourished, well developed, in no acute distress NECK: No JVD; No carotid bruits CARDIAC: ***RRR, no murmurs, rubs, gallops RESPIRATORY:  Clear to auscultation without rales, wheezing or rhonchi  ABDOMEN: Soft, non-tender, non-distended, normal bowel sounds EXTREMITIES:  Warm and well perfused, no edema; No deformity, 2+ radial pulses PSYCH: Normal mood and affect   ASSESSMENT AND PLAN: .    #Elevated CAC (score >2000)  #PAF   #R CAS s/p Stenting 2004  #HTN   #HLD Lipid panel    {Are you ordering a CV Procedure (e.g. stress test, cath, DCCV, TEE, etc)?   Press F2        :[id number]}   This note was written with the assistance of a dictation microphone or AI dictation software. Please excuse any typos or grammatical errors.   Signed, Georganna Archer, MD  03/21/2024 10:29 AM    [facility] "

## 2024-03-22 ENCOUNTER — Ambulatory Visit: Admitting: Student in an Organized Health Care Education/Training Program

## 2024-03-29 ENCOUNTER — Encounter: Payer: Self-pay | Admitting: Pulmonary Disease

## 2024-03-29 ENCOUNTER — Ambulatory Visit: Admitting: Pulmonary Disease

## 2024-03-29 VITALS — BP 137/60 | HR 49 | Temp 97.5°F | Ht 69.0 in | Wt 155.0 lb

## 2024-03-29 DIAGNOSIS — J849 Interstitial pulmonary disease, unspecified: Secondary | ICD-10-CM

## 2024-03-29 LAB — COMPREHENSIVE METABOLIC PANEL WITH GFR
ALT: 11 U/L (ref 3–53)
AST: 16 U/L (ref 5–37)
Albumin: 4 g/dL (ref 3.5–5.2)
Alkaline Phosphatase: 80 U/L (ref 39–117)
BUN: 30 mg/dL — ABNORMAL HIGH (ref 6–23)
CO2: 27 meq/L (ref 19–32)
Calcium: 9.2 mg/dL (ref 8.4–10.5)
Chloride: 101 meq/L (ref 96–112)
Creatinine, Ser: 1.84 mg/dL — ABNORMAL HIGH (ref 0.40–1.50)
GFR: 33.94 mL/min — ABNORMAL LOW
Glucose, Bld: 116 mg/dL — ABNORMAL HIGH (ref 70–99)
Potassium: 4.7 meq/L (ref 3.5–5.1)
Sodium: 136 meq/L (ref 135–145)
Total Bilirubin: 0.4 mg/dL (ref 0.2–1.2)
Total Protein: 7.8 g/dL (ref 6.0–8.3)

## 2024-03-29 LAB — CBC WITH DIFFERENTIAL/PLATELET
Basophils Absolute: 0 10*3/uL (ref 0.0–0.1)
Basophils Relative: 0.5 % (ref 0.0–3.0)
Eosinophils Absolute: 0.1 10*3/uL (ref 0.0–0.7)
Eosinophils Relative: 1.1 % (ref 0.0–5.0)
HCT: 36.1 % — ABNORMAL LOW (ref 39.0–52.0)
Hemoglobin: 12.2 g/dL — ABNORMAL LOW (ref 13.0–17.0)
Lymphocytes Relative: 24.6 % (ref 12.0–46.0)
Lymphs Abs: 1.3 10*3/uL (ref 0.7–4.0)
MCHC: 33.9 g/dL (ref 30.0–36.0)
MCV: 107.2 fl — ABNORMAL HIGH (ref 78.0–100.0)
Monocytes Absolute: 0.4 10*3/uL (ref 0.1–1.0)
Monocytes Relative: 8.2 % (ref 3.0–12.0)
Neutro Abs: 3.5 10*3/uL (ref 1.4–7.7)
Neutrophils Relative %: 65.6 % (ref 43.0–77.0)
Platelets: 132 10*3/uL — ABNORMAL LOW (ref 150.0–400.0)
RBC: 3.36 Mil/uL — ABNORMAL LOW (ref 4.22–5.81)
RDW: 15.6 % — ABNORMAL HIGH (ref 11.5–15.5)
WBC: 5.3 10*3/uL (ref 4.0–10.5)

## 2024-03-29 NOTE — Patient Instructions (Signed)
" °  VISIT SUMMARY: Today, we discussed the changes in your lungs noted on your recent CT scan and evaluated your overall health, including your heart rhythm and joint pain.  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 There are no current symptoms of shortness of breath or other lung issues. -We discussed the potential for progression and the impact on lung function. Without treatment, median survival is approximately five years, but antifibrotic medications can significantly increase life expectancy. -We discussed the potential for exacerbation due to viral infections or other insults. -I have ordered blood tests to evaluate for autoimmune conditions. -I have ordered pulmonary function tests to assess your baseline lung function. -Please complete and return the exposure questionnaire provided. -We will have a follow-up appointment in 2-3 months to review test results and discuss treatment options.    Contains text generated by [employer].   "

## 2024-03-29 NOTE — Progress Notes (Unsigned)
 "              Timothy [name]    [id number]    1940/08/13  Primary Care Physician:Perini, Oneil, MD  Referring Physician: Shayne Oneil, MD [address] [place],  [place] [address]  Chief complaint: Consult for interstitial lung disease  HPI: 84 y.o. 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  Discussed the use of AI scribe software for clinical note transcription with the patient, who gave verbal consent to proceed.  History of Present Illness Timothy [name] is an 84 year old male with interstitial lung disease who presents for evaluation of lung changes noted on CT scan.  Pulmonary symptoms and imaging findings - Interstitial lung disease with lung scarring present since at least 2020, first noted on cardiac CT - High-res CT scan in December 2025 revealed progressive pulmonary fibrosis and UIP pattern - No shortness of breath or other acute pulmonary symptoms - Gradual reduction in stamina over the past 5 to 10 years - Recently increased activity level, walking 30 minutes daily  Cardiac arrhythmia - Long-standing irregular heartbeats and atrial fibrillation - Episodes of atrial fibrillation occur every few weeks to months - Takes Eliquis  for atrial fibrillation  Musculoskeletal symptoms - Joint pain and stiffness, primarily affecting the hands - No significant morning stiffness - No loss of grip strength    Relevant Pulmonary history: Pets: No pets Occupation: Retired teacher, early years/pre Exposures: No mold,  hot tub, Jacuzzi.  No feather pillows or comforters No h/o chemo/XRT/amiodarone/macrodantin/MTX  No exposure to asbestos, silica or other organic allergens  Smoking history: Minimal smoking history during college at the age of 32 Travel history: No significant travel history Family history: No family history of lung disease   Outpatient Encounter Medications as of 03/29/2024  Medication Sig   allopurinol  (ZYLOPRIM ) 300 MG tablet Take 300 mg by mouth daily.    amLODipine  (NORVASC ) 10 MG tablet Take 10 mg by mouth daily.   apixaban  (ELIQUIS ) 2.5 MG TABS tablet Take 2.5 mg by mouth 2 (two) times daily.   ezetimibe (ZETIA) 10 MG tablet Take 10 mg by mouth daily.   famotidine (PEPCID) 20 MG tablet Take 20 mg by mouth 2 (two) times daily.   furosemide  (LASIX ) 20 MG tablet Take 20 mg by mouth daily.   REPATHA SURECLICK 140 MG/ML SOAJ Inject 140 mg into the skin every 14 (fourteen) days.   telmisartan (MICARDIS) 80 MG tablet Take 1 tablet by mouth daily.   No facility-administered encounter medications on file as of 03/29/2024.     Physical Exam: Today's Vitals   03/29/24 1259  BP: (!) 143/63  Pulse: (!) 49  Temp: (!) 97.5 F (36.4 C)  TempSrc: Oral  SpO2: 97%  Weight: 155 lb (70.3 kg)  Height: 5' 9 (1.753 m)   Body mass index is 22.89 kg/m.  Physical Exam GEN: No acute distress. CV: Regular rate and rhythm, no murmurs. LUNGS: Clear to auscultation bilaterally, normal respiratory effort. SKIN JOINTS: Warm and dry, no rash.    Data Reviewed: Imaging: High-resolution CT 02/09/2024-pattern of interstitial lung disease. PE pattern.  Progressive since 2020. I have reviewed the images personally.  PFTs:  Labs: Assessment & Plan Pulmonary fibrosis Chronic interstitial lung disease with fibrosis, first noted in 2020 and progressing since then. No significant exposure to known fibrogenic agents. Differential diagnosis includes autoimmune conditions such as rheumatoid arthritis or  connective tissue diseases. No current symptoms of dyspnea or lung issues. Discussed potential for progression and impact on lung function. Explained that without treatment, median survival is approximately five years, but antifibrotic medications can significantly increase life expectancy. Discussed potential for exacerbation due to viral infections or other insults. He is concerned about longevity and has taken steps to ensure financial and estate matters are in order. - Ordered blood tests to evaluate for autoimmune conditions. - Ordered pulmonary function tests to assess baseline lung function. - Provided exposure questionnaire for completion and return. - Scheduled follow-up appointment in 2-3 months to review test results and discuss treatment options.  Recommendations: CTD serologies PFTs  Lonna Coder MD [facility] 03/29/2024, 1:08 PM  CC: Shayne Anes, MD   "

## 2024-04-01 LAB — ANA,IFA RA DIAG PNL W/RFLX TIT/PATN
Cyclic Citrullin Peptide Ab: <16 U
Cyclic Citrullin Peptide Ab: <16 U — AB
Rheumatoid fact SerPl-aCnc: <10 [IU]/mL

## 2024-04-01 LAB — ANTI-NUCLEAR AB-TITER (ANA TITER)
ANA TITER: 1:40 {titer} — ABNORMAL HIGH
ANA Titer 1: 1:40 {titer} — ABNORMAL HIGH

## 2024-06-07 ENCOUNTER — Encounter

## 2024-06-07 ENCOUNTER — Ambulatory Visit: Admitting: Pulmonary Disease
